# Patient Record
Sex: Male | Born: 1950 | Race: White | Hispanic: No | Marital: Married | Smoking: Former smoker
Health system: Southern US, Community
[De-identification: ages and names within clinical notes are randomized; demographics above are authoritative.]

## PROBLEM LIST (undated history)

## (undated) DIAGNOSIS — I10 Essential (primary) hypertension: Secondary | ICD-10-CM

## (undated) DIAGNOSIS — M109 Gout, unspecified: Secondary | ICD-10-CM

## (undated) DIAGNOSIS — N200 Calculus of kidney: Secondary | ICD-10-CM

## (undated) DIAGNOSIS — M1990 Unspecified osteoarthritis, unspecified site: Secondary | ICD-10-CM

## (undated) DIAGNOSIS — E785 Hyperlipidemia, unspecified: Secondary | ICD-10-CM

## (undated) DIAGNOSIS — E669 Obesity, unspecified: Secondary | ICD-10-CM

## (undated) DIAGNOSIS — K219 Gastro-esophageal reflux disease without esophagitis: Secondary | ICD-10-CM

## (undated) DIAGNOSIS — C801 Malignant (primary) neoplasm, unspecified: Secondary | ICD-10-CM

## (undated) DIAGNOSIS — H269 Unspecified cataract: Secondary | ICD-10-CM

## (undated) DIAGNOSIS — N529 Male erectile dysfunction, unspecified: Secondary | ICD-10-CM

## (undated) HISTORY — DX: Calculus of kidney: N20.0

## (undated) HISTORY — DX: Hyperlipidemia, unspecified: E78.5

## (undated) HISTORY — DX: Malignant (primary) neoplasm, unspecified: C80.1

## (undated) HISTORY — DX: Obesity, unspecified: E66.9

## (undated) HISTORY — DX: Gout, unspecified: M10.9

## (undated) HISTORY — DX: Essential (primary) hypertension: I10

## (undated) HISTORY — DX: Male erectile dysfunction, unspecified: N52.9

## (undated) HISTORY — DX: Unspecified cataract: H26.9

## (undated) HISTORY — PX: KNEE SURGERY: SHX244

## (undated) HISTORY — DX: Unspecified osteoarthritis, unspecified site: M19.90

---

## 2005-07-19 HISTORY — PX: TOTAL KNEE ARTHROPLASTY: SHX125

## 2007-07-20 HISTORY — PX: COLONOSCOPY: SHX174

## 2007-10-05 ENCOUNTER — Ambulatory Visit: Payer: Self-pay | Admitting: Internal Medicine

## 2007-10-05 DIAGNOSIS — E669 Obesity, unspecified: Secondary | ICD-10-CM | POA: Insufficient documentation

## 2007-10-05 DIAGNOSIS — N529 Male erectile dysfunction, unspecified: Secondary | ICD-10-CM | POA: Insufficient documentation

## 2007-10-05 DIAGNOSIS — M159 Polyosteoarthritis, unspecified: Secondary | ICD-10-CM | POA: Insufficient documentation

## 2007-10-05 DIAGNOSIS — I10 Essential (primary) hypertension: Secondary | ICD-10-CM | POA: Insufficient documentation

## 2007-10-05 DIAGNOSIS — E785 Hyperlipidemia, unspecified: Secondary | ICD-10-CM | POA: Insufficient documentation

## 2007-10-06 LAB — CONVERTED CEMR LAB
ALT: 22 units/L (ref 0–53)
AST: 27 units/L (ref 0–37)
Albumin: 3.9 g/dL (ref 3.5–5.2)
Alkaline Phosphatase: 85 units/L (ref 39–117)
Basophils Absolute: 0 10*3/uL (ref 0.0–0.1)
Basophils Relative: 0.3 % (ref 0.0–1.0)
CO2: 31 meq/L (ref 19–32)
Creatinine, Ser: 1 mg/dL (ref 0.4–1.5)
GFR calc Af Amer: 100 mL/min
GFR calc non Af Amer: 83 mL/min
Glucose, Bld: 84 mg/dL (ref 70–99)
HDL: 29.7 mg/dL — ABNORMAL LOW (ref >39.0)
Hemoglobin: 14.2 g/dL (ref 13.0–17.0)
LDL Cholesterol: 80 mg/dL (ref 0–99)
MCHC: 32.8 g/dL (ref 30.0–36.0)
Monocytes Relative: 9.2 % (ref 3.0–11.0)
Platelets: 235 10*3/uL (ref 150–400)
RBC: 5.03 M/uL (ref 4.22–5.81)
RDW: 13.1 % (ref 11.5–14.6)
Sodium: 141 meq/L (ref 135–145)
TSH: 0.93 microintl units/mL (ref 0.35–5.50)
Total Bilirubin: 1.1 mg/dL (ref 0.3–1.2)
Total CHOL/HDL Ratio: 4.3
Triglycerides: 99 mg/dL (ref 0–149)
VLDL: 20 mg/dL (ref 0–40)

## 2007-12-20 ENCOUNTER — Telehealth (INDEPENDENT_AMBULATORY_CARE_PROVIDER_SITE_OTHER): Payer: Self-pay | Admitting: *Deleted

## 2008-01-09 ENCOUNTER — Ambulatory Visit: Payer: Self-pay | Admitting: Internal Medicine

## 2008-02-20 ENCOUNTER — Ambulatory Visit: Payer: Self-pay | Admitting: Gastroenterology

## 2008-03-04 ENCOUNTER — Telehealth: Payer: Self-pay | Admitting: Gastroenterology

## 2008-03-05 ENCOUNTER — Encounter: Payer: Self-pay | Admitting: Gastroenterology

## 2008-03-05 ENCOUNTER — Ambulatory Visit: Payer: Self-pay | Admitting: Gastroenterology

## 2008-03-06 ENCOUNTER — Encounter: Payer: Self-pay | Admitting: Gastroenterology

## 2008-07-15 ENCOUNTER — Ambulatory Visit: Payer: Self-pay | Admitting: Internal Medicine

## 2008-07-16 LAB — CONVERTED CEMR LAB
Alkaline Phosphatase: 85 units/L (ref 39–117)
BUN: 10 mg/dL (ref 6–23)
Bilirubin, Direct: 0.1 mg/dL (ref 0.0–0.3)
Chloride: 109 meq/L (ref 96–112)
Cholesterol: 133 mg/dL (ref 0–200)
Creatinine, Ser: 1 mg/dL (ref 0.4–1.5)
GFR calc Af Amer: 100 mL/min
LDL Cholesterol: 72 mg/dL (ref 0–99)
Total Protein: 6.8 g/dL (ref 6.0–8.3)

## 2008-09-16 HISTORY — PX: PATELLAR TENDON REPAIR: SHX737

## 2008-12-30 ENCOUNTER — Telehealth: Payer: Self-pay | Admitting: Internal Medicine

## 2009-01-30 ENCOUNTER — Ambulatory Visit: Payer: Self-pay | Admitting: Internal Medicine

## 2009-02-03 LAB — CONVERTED CEMR LAB
AST: 31 units/L (ref 0–37)
Alkaline Phosphatase: 98 units/L (ref 39–117)
Basophils Relative: 0.4 % (ref 0.0–3.0)
CO2: 30 meq/L (ref 19–32)
Calcium: 10 mg/dL (ref 8.4–10.5)
Chloride: 106 meq/L (ref 96–112)
Creatinine, Ser: 1.2 mg/dL (ref 0.4–1.5)
Eosinophils Absolute: 0.4 10*3/uL (ref 0.0–0.7)
HCT: 38.8 % — ABNORMAL LOW (ref 39.0–52.0)
Hemoglobin: 12.9 g/dL — ABNORMAL LOW (ref 13.0–17.0)
MCHC: 33.2 g/dL (ref 30.0–36.0)
MCV: 85.9 fL (ref 78.0–100.0)
Monocytes Absolute: 0.8 10*3/uL (ref 0.1–1.0)
Neutro Abs: 6.1 10*3/uL (ref 1.4–7.7)
Potassium: 5.1 meq/L (ref 3.5–5.1)
RBC: 4.52 M/uL (ref 4.22–5.81)
Sodium: 142 meq/L (ref 135–145)
Total Bilirubin: 0.9 mg/dL (ref 0.3–1.2)
Total CHOL/HDL Ratio: 4

## 2009-02-17 ENCOUNTER — Ambulatory Visit: Payer: Self-pay | Admitting: Internal Medicine

## 2009-04-11 ENCOUNTER — Telehealth: Payer: Self-pay | Admitting: Internal Medicine

## 2009-05-01 ENCOUNTER — Telehealth: Payer: Self-pay | Admitting: Internal Medicine

## 2009-05-19 HISTORY — PX: TOTAL SHOULDER REPLACEMENT: SUR1217

## 2009-05-26 ENCOUNTER — Encounter: Payer: Self-pay | Admitting: Internal Medicine

## 2009-05-28 ENCOUNTER — Encounter: Payer: Self-pay | Admitting: Internal Medicine

## 2009-06-02 ENCOUNTER — Telehealth: Payer: Self-pay | Admitting: Internal Medicine

## 2009-06-16 ENCOUNTER — Telehealth: Payer: Self-pay | Admitting: Internal Medicine

## 2009-07-09 ENCOUNTER — Telehealth (INDEPENDENT_AMBULATORY_CARE_PROVIDER_SITE_OTHER): Payer: Self-pay | Admitting: *Deleted

## 2009-08-25 ENCOUNTER — Ambulatory Visit: Payer: Self-pay | Admitting: Internal Medicine

## 2009-09-04 ENCOUNTER — Ambulatory Visit: Payer: Self-pay | Admitting: Internal Medicine

## 2009-11-20 ENCOUNTER — Encounter: Payer: Self-pay | Admitting: Internal Medicine

## 2009-11-20 ENCOUNTER — Telehealth: Payer: Self-pay | Admitting: Internal Medicine

## 2009-11-21 ENCOUNTER — Telehealth: Payer: Self-pay | Admitting: Internal Medicine

## 2009-12-11 ENCOUNTER — Ambulatory Visit: Payer: Self-pay | Admitting: Internal Medicine

## 2009-12-11 DIAGNOSIS — M109 Gout, unspecified: Secondary | ICD-10-CM | POA: Insufficient documentation

## 2010-03-11 ENCOUNTER — Ambulatory Visit: Payer: Self-pay | Admitting: Internal Medicine

## 2010-03-12 LAB — CONVERTED CEMR LAB
AST: 37 units/L (ref 0–37)
Albumin: 3.9 g/dL (ref 3.5–5.2)
BUN: 14 mg/dL (ref 6–23)
Basophils Absolute: 0 10*3/uL (ref 0.0–0.1)
Basophils Relative: 0.4 % (ref 0.0–3.0)
CO2: 30 meq/L (ref 19–32)
Chloride: 105 meq/L (ref 96–112)
GFR calc non Af Amer: 77.34 mL/min (ref >60)
HCT: 42.9 % (ref 39.0–52.0)
HDL: 32.9 mg/dL — ABNORMAL LOW (ref >39.00)
Hemoglobin: 14.6 g/dL (ref 13.0–17.0)
LDL Cholesterol: 79 mg/dL (ref 0–99)
Lymphs Abs: 1.6 10*3/uL (ref 0.7–4.0)
Monocytes Relative: 8 % (ref 3.0–12.0)
Neutro Abs: 4.8 10*3/uL (ref 1.4–7.7)
PSA: 0.19 ng/mL (ref 0.10–4.00)
Potassium: 5.4 meq/L — ABNORMAL HIGH (ref 3.5–5.1)
RBC: 4.94 M/uL (ref 4.22–5.81)
RDW: 14.2 % (ref 11.5–14.6)
Sex Hormone Binding: 42 nmol/L (ref 13–71)
Testosterone Free: 79.6 pg/mL (ref 47.0–244.0)
Testosterone-% Free: 1.8 % (ref 1.6–2.9)
Testosterone: 449.01 ng/dL (ref 350–890)
Total Bilirubin: 1.3 mg/dL — ABNORMAL HIGH (ref 0.3–1.2)
Total CHOL/HDL Ratio: 4
Triglycerides: 64 mg/dL (ref 0.0–149.0)
VLDL: 12.8 mg/dL (ref 0.0–40.0)

## 2010-04-01 ENCOUNTER — Ambulatory Visit: Payer: Self-pay | Admitting: Internal Medicine

## 2010-04-01 LAB — CONVERTED CEMR LAB: Potassium: 4.8 meq/L (ref 3.5–5.1)

## 2010-08-05 ENCOUNTER — Ambulatory Visit
Admission: RE | Admit: 2010-08-05 | Discharge: 2010-08-05 | Payer: Self-pay | Source: Home / Self Care | Attending: Family Medicine | Admitting: Family Medicine

## 2010-08-05 DIAGNOSIS — J22 Unspecified acute lower respiratory infection: Secondary | ICD-10-CM | POA: Insufficient documentation

## 2010-08-05 LAB — CONVERTED CEMR LAB: Rapid Strep: NEGATIVE

## 2010-08-10 ENCOUNTER — Telehealth: Payer: Self-pay | Admitting: Family Medicine

## 2010-08-18 NOTE — Assessment & Plan Note (Signed)
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 * Codiene  Past History:  Past medical, surgical, family and social histories (including risk factors) reviewed for relevance to current acute and chronic problems.  Past Medical History: Reviewed history from 07/15/2008 and no changes required. Hyperlipidemia Hypertension Osteoarthritis Erectile dysfunction Obesity  Past Surgical History: Reviewed history from 08/25/2009 and no changes required. Age 23  Knee surgery for bowlegs 2007 Bilateral TKR 3/10  Left knee repair (detached patella)--- Dr Massie Maroon [facility]) 11/10 Left total shoulder replacement  Family History: Reviewed history from 10/05/2007 and no changes required. Dad died CHF @70    Had CAD Mom with high chol 1 brother--healthy HTN, DM in family No other CAD No prostate or colon cancer  Social History: Reviewed history from 10/05/2007 and no changes required. Occupation: Administrator, [employer] Married-2 daughters Former Smoker--quit in 20's Alcohol use-no  Review of Systems       no fever no ankle swelling  Physical Exam  General:  alert and normal appearance.   Msk:  left 2nd toe with sig hammer deformity moderate swelling with some puffiness Redness and some warmth slight redness past MTP and into foot   Impression & Recommendations:  Problem # 1:  CELLULITIS, FOOT (ICD-682.7) Assessment New unclear whether this is infection or  gout usually gets gout in 1st MTP and the degree of tenderness not necessarily indicative of the gout  P: will Rx with keflex    he will take the colchicine two times a day over the next few days as well  Problem # 2:  GOUT (ICD-274.9) Assessment: Comment Only will try the colchicine now  His updated medication list for this problem includes:    Colchicine 0.6 Mg Tabs (Colchicine) .Marland Kitchen... Take 1 tablet by mouth up to three times a day as needed acute gout if still available)  Problem # 3:  RASH AND OTHER NONSPECIFIC SKIN ERUPTION (ICD-782.1) Assessment: Comment Only mild rash intermittently always does well with topicort His updated medication list for this problem includes:    Desoximetasone 0.25 % Crea (Desoximetasone) .Marland Kitchen... Apply two times a day to rash as needed  Complete Medication List: 1)  Bisoprolol Fumarate 10 Mg Tabs (Bisoprolol fumarate) .... Take 1 tablet by mouth once a day 2)  Lisinopril 20 Mg Tabs (Lisinopril) .... Take 1 tablet by mouth once a day 3)  Lipitor 20 Mg Tabs (Atorvastatin calcium) .... Take 1 tablet by mouth once a day 4)  Adult Aspirin Low Strength 81 Mg Tbdp (Aspirin) .... Take 1 tablet by mouth once a day 5)  Levitra 20 Mg Tabs (Vardenafil hcl) .... Take 1 about 1/2 hour before sex 6)  Phentermine Hcl 37.5 Mg Tabs (Phentermine hcl) .Marland Kitchen.. 1 daily 7)  Omeprazole 20 Mg Cpdr (Omeprazole) .Marland Kitchen.. 1 tab daily for the next month. 8)  Colchicine 0.6 Mg  Tabs (Colchicine) .... Take 1 tablet by mouth up to three times a day as needed acute gout if still available) 9)  Cephalexin 500 Mg Caps (Cephalexin) .Marland Kitchen.. 1 tab by mouth three times a day for toe infection 10)  Desoximetasone 0.25 % Crea (Desoximetasone) .... Apply two times a day to rash as needed  Patient Instructions: 1)  Please schedule a follow-up appointment as needed .  Prescriptions: DESOXIMETASONE 0.25 % CREA (DESOXIMETASONE) apply two times a day to rash as needed  #45gm x 1   Entered and Authorized by:    Cindee Salt MD   Signed by:   Cindee Salt MD on 12/11/2009   Method used:   Electronically to        [facility] [address] [phone]* (retail)       [address] [place], [place]  [address]       Ph: [phone]       Fax: [phone]   RxID:   [id number] CEPHALEXIN 500 MG CAPS (CEPHALEXIN) 1 tab by mouth three times a day for toe infection  #30 x 0   Entered and Authorized by:   Cindee Salt MD   Signed by:   Cindee Salt MD on 12/11/2009   Method used:   Electronically to        [facility] [address] [phone]* (retail)       [address] [place], [place]  [address]       Ph: [phone]       Fax: [phone]   RxID:   [id number] LISINOPRIL 20 MG  TABS (LISINOPRIL) Take 1 tablet by mouth once a day  #90.0 Each x 3   Entered by:   Mervin Hack CMA (AAMA)   Authorized by:   Cindee Salt MD   Signed by:   Mervin Hack CMA (AAMA) on 12/11/2009   Method used:   Electronically to        [facility] [address] [phone]* (retail)       [address] [place]  [address]       Ph: [phone]       Fax: [phone]   RxID:   [id number]   Current Allergies (reviewed today): ! * CODIENE

## 2010-08-18 NOTE — Medication Information (Signed)
Summary: Prior Authorization Needed for Lipitor/[employer]  Prior Authorization Needed for Lipitor/[employer]   Imported By: Lanelle Bal 11/26/2009 10:52:42  _____________________________________________________________________  External Attachment:    Type:   Image     Comment:   External Document  Appended Document: Prior Authorization Needed for Lipitor/[employer] please check with the pharmacy to see if action is needed on this  Appended Document: Prior Authorization Needed for Lipitor/[employer] spoke with [facility] pharmacist and they states rx was approved and paid for by insurance 11/20/2009.

## 2010-08-18 NOTE — Assessment & Plan Note (Signed)
Summary: CPX/DLO   Vital Signs:  Patient profile:   60 year old male Weight:      250 pounds Temp:     98.9 degrees F oral Pulse rate:   76 / minute Pulse rhythm:   regular BP sitting:   148 / 100  (left arm) Cuff size:   large  Vitals Entered By: Mervin Hack CMA [facility]) (March 11, 2010 9:50 AM) CC: adult physical   History of Present Illness: Doing well in general Forget his wallet--got him anxious --this may have gotten his BP up doesn't independently check BP  Gout has been quiet  Still with ED not satisfied with levitra and viagra  Still convinced that the phentermine helps his appetite   Allergies: 1)  ! * Codiene  Past History:  Past medical, surgical, family and social histories (including risk factors) reviewed for relevance to current acute and chronic problems.  Past Medical History: Reviewed history from 07/15/2008 and no changes required. Hyperlipidemia Hypertension Osteoarthritis Erectile dysfunction Obesity  Past Surgical History: Reviewed history from 08/25/2009 and no changes required. Age 40  Knee surgery for bowlegs 2007 Bilateral TKR 3/10  Left knee repair (detached patella)--- Dr Massie Maroon [facility]) 11/10 Left total shoulder replacement  Family History: Reviewed history from 10/05/2007 and no changes required. Dad died CHF @70    Had CAD Mom with high chol 1 brother--healthy HTN, DM in family No other CAD No prostate or colon cancer  Social History: Reviewed history from 10/05/2007 and no changes required. 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 Eyes:  Denies double vision and vision loss-1 eye. ENT:  Denies decreased hearing and ringing in ears; teeth fine--regular with dentist. CV:  Denies chest pain or discomfort, difficulty breathing at night, difficulty breathing  while lying down, fainting, lightheadness, palpitations, and shortness of breath with exertion; no real exercise. Resp:  Denies cough and shortness of breath. GI:  Denies abdominal pain, bloody stools, change in bowel habits, dark tarry stools, indigestion, nausea, and vomiting. GU:  Complains of erectile dysfunction and nocturia; denies urinary frequency and urinary hesitancy; nocturia x 1. MS:  Complains of joint pain; denies joint swelling; gets soreness in right 3rd finger if overuses Right shoulder soreness at times. Derm:  Complains of lesion(s); denies rash; gets dry skin and occ bllisters. Neuro:  Denies headaches, numbness, tingling, and weakness. Psych:  Denies anxiety and depression. Endo:  Denies cold intolerance. Heme:  Denies abnormal bruising and enlarge lymph nodes. Allergy:  Denies seasonal allergies and sneezing.  Physical Exam  General:  alert and normal appearance.   Eyes:  pupils equal, pupils round, pupils reactive to light, and no optic disk abnormalities.   Ears:  R ear normal and L ear normal.   Mouth:  no erythema, no exudates, and no lesions.   Neck:  supple, no masses, no thyromegaly, no carotid bruits, and no cervical lymphadenopathy.   Lungs:  normal respiratory effort, no intercostal retractions, no accessory muscle use, and normal breath sounds.   Heart:  normal rate, regular rhythm, no murmur, and no gallop.   Abdomen:  soft, non-tender, and no masses.   Rectal:  no hemorrhoids and no masses.   Prostate:  no gland enlargement and no nodules.   Msk:  no joint tenderness and no joint swelling.   Pulses:  1+ in feet Extremities:  no edema  Neurologic:  alert & oriented X3, strength normal in all extremities, and gait normal.   Skin:  no rashes and no suspicious lesions.   Axillary Nodes:  No palpable lymphadenopathy Psych:  normally interactive, good eye contact, not anxious appearing, and not depressed appearing.     Impression &  Recommendations:  Problem # 1:  PREVENTIVE HEALTH CARE (ICD-V70.0) Assessment Comment Only discussed exercise due for PSA will hold phentermine for several weeks to see if helps ED---will continue if he requests  Problem # 2:  ERECTILE DYSFUNCTION, ORGANIC (ICD-607.84) Assessment: Deteriorated  will check free testosterone trial cialis urology if needs more  The following medications were removed from the medication list:    Levitra 20 Mg Tabs (Vardenafil hcl) .Marland Kitchen... Take 1 about 1/2 hour before sex His updated medication list for this problem includes:    Cialis 20 Mg Tabs (Tadalafil) .Marland Kitchen... 1 tab about 1 hour before sex  Orders: Specimen Handling (04540) T- * Misc. Laboratory test [id number])  Problem # 3:  HYPERTENSION (ICD-401.9) Assessment: Comment Only  recheck 146/98 has been well controlled no changes for now will try off the phentermine  His updated medication list for this problem includes:    Bisoprolol Fumarate 10 Mg Tabs (Bisoprolol fumarate) .Marland Kitchen... Take 1 tablet by mouth once a day    Lisinopril 20 Mg Tabs (Lisinopril) .Marland Kitchen... Take 1 tablet by mouth once a day  BP today: 148/100 Prior BP: 128/80 (12/11/2009)  Labs Reviewed: K+: 5.1 (01/30/2009) Creat: : 1.2 (01/30/2009)   Chol: 106 (01/30/2009)   HDL: 28.90 (01/30/2009)   LDL: 56 (01/30/2009)   TG: 106.0 (01/30/2009)  Orders: TLB-Renal Function Panel (80069-RENAL) TLB-CBC Platelet - w/Differential (85025-CBCD) TLB-TSH (Thyroid Stimulating Hormone) (84443-TSH) Venipuncture (14782)  Problem # 4:  HYPERLIPIDEMIA (ICD-272.4) Assessment: Unchanged  will recheck labs  His updated medication list for this problem includes:    Lipitor 20 Mg Tabs (Atorvastatin calcium) .Marland Kitchen... Take 1 tablet by mouth once a day  Labs Reviewed: SGOT: 31 (01/30/2009)   SGPT: 22 (01/30/2009)   HDL:28.90 (01/30/2009), 37.8 (07/15/2008)  LDL:56 (01/30/2009), 72 (07/15/2008)  Chol:106 (01/30/2009), 133 (07/15/2008)  Trig:106.0  (01/30/2009), 116 (07/15/2008)  Orders: TLB-Lipid Panel (80061-LIPID) TLB-Hepatic/Liver Function Pnl (80076-HEPATIC)  Complete Medication List: 1)  Bisoprolol Fumarate 10 Mg Tabs (Bisoprolol fumarate) .... Take 1 tablet by mouth once a day 2)  Lisinopril 20 Mg Tabs (Lisinopril) .... Take 1 tablet by mouth once a day 3)  Lipitor 20 Mg Tabs (Atorvastatin calcium) .... Take 1 tablet by mouth once a day 4)  Phentermine Hcl 37.5 Mg Tabs (Phentermine hcl) .Marland Kitchen.. 1 daily 5)  Colchicine 0.6 Mg Tabs (Colchicine) .... Take 1 tablet by mouth up to three times a day as needed acute gout if still available) 6)  Adult Aspirin Low Strength 81 Mg Tbdp (Aspirin) .... Take 1 tablet by mouth once a day 7)  Omeprazole 20 Mg Cpdr (Omeprazole) .Marland Kitchen.. 1 tab daily for the next month. 8)  Cialis 20 Mg Tabs (Tadalafil) .Marland Kitchen.. 1 tab about 1 hour before sex  Other Orders: TLB-PSA (Prostate Specific Antigen) (84153-PSA)  Patient Instructions: 1)  Please try off the phentermine for several weeks. Call if you want to restart 2)  Please schedule a follow-up appointment in 6 months .  Prescriptions: CIALIS 20 MG TABS (TADALAFIL) 1 tab about 1 hour before sex  #6 x 5   Entered and Authorized by:   Cindee Salt MD   Signed by:   Cindee Salt MD on  03/11/2010   Method used:   Electronically to        [facility]. [address] [phone]* (retail)       [address], [place]  [address]       Ph: [phone]       Fax: [phone]   RxID:   [id number]   Current Allergies (reviewed today): ! * CODIENE

## 2010-08-18 NOTE — Progress Notes (Signed)
Summary: Prior Authorization for Lipitor Adria Dill)  Phone Note From Pharmacy Call back at [phone]   Caller: [employer] Call For: Dr. Alphonsus Sias  Summary of Call: Received fax from [facility] stating that Lipitor 20mg  has be approved up to 60 days from 11/20/2009.  Adria Dill has entered an over-ride to allow claims for this medication to process under the patient's prescription benefit plan.  Form sent to be scanned into EMR.   Initial call taken by: Linde Gillis CMA [facility]),  Nov 20, 2009 2:54 PM

## 2010-08-18 NOTE — Progress Notes (Signed)
Summary: samples of lipitor given  Phone Note Call from Patient   Caller: Patient Summary of Call: Pt requests samples of lipitor while he waits for prior auth.  2 boxes of # 7 each given of lipitor 20 mg's.  Lot [id number], exp 11/12. Initial call taken by: Lowella Petties CMA,  Nov 21, 2009 12:23 PM

## 2010-08-18 NOTE — Assessment & Plan Note (Signed)
Summary: HEADACHE/COUGH/[place] PT/LETVAK/[place] LOU/CD   Vital Signs:  Patient profile:   60 year old male Height:      74 inches Weight:      251.50 pounds BMI:     32.41 O2 Sat:      97 % on Room air Temp:     98.7 degrees F oral Pulse rate:   66 / minute BP sitting:   152 / 96  (left arm) Cuff size:   large  Vitals Entered ByZella Ball Ewing (September 04, 2009 4:39 PM)  O2 Flow:  Room air CC: headache, cough, congestion/RE   CC:  headache, cough, and congestion/RE.  History of Present Illness: here with above;  has facial pain, pressure, fever and greenish d/c for 3 days; some mild ST, but Pt denies CP, sob, doe, wheezing, orthopnea, pnd, worsening LE edema, palps, dizziness or syncope   Pt denies new neuro symptoms such as headache, facial or extremity weakness   Overall good complaince with meds, tolerating well.    Problems Prior to Update: 1)  Sinusitis- Acute-nos  (ICD-461.9) 2)  Wart, Right Hand  (ICD-078.10) 3)  Preventive Health Care  (ICD-V70.0) 4)  Abdominal Pain, Epigastric  (ICD-789.06) 5)  Special Screening Malignant Neoplasm of Prostate  (ICD-V76.44) 6)  Obesity  (ICD-278.00) 7)  Erectile Dysfunction, Organic  (ICD-607.84) 8)  Osteoarthritis  (ICD-715.90) 9)  Hypertension  (ICD-401.9) 10)  Hyperlipidemia  (ICD-272.4)  Medications Prior to Update: 1)  Bisoprolol Fumarate 10 Mg  Tabs (Bisoprolol Fumarate) .... Take 1 Tablet By Mouth Once A Day 2)  Lisinopril 20 Mg  Tabs (Lisinopril) .... Take 1 Tablet By Mouth Once A Day 3)  Lipitor 20 Mg  Tabs (Atorvastatin Calcium) .... Take 1 Tablet By Mouth Once A Day 4)  Adult Aspirin Low Strength 81 Mg  Tbdp (Aspirin) .... Take 1 Tablet By Mouth Once A Day 5)  Levitra 20 Mg  Tabs (Vardenafil Hcl) .... Take 1 About 1/2 Hour Before Sex 6)  Phentermine Hcl 37.5 Mg  Tabs (Phentermine Hcl) .Marland Kitchen.. 1 Daily 7)  Omeprazole 20 Mg Cpdr (Omeprazole) .Marland Kitchen.. 1 Tab Daily For The Next Month. 8)  Oxycodone Hcl 5 Mg Tabs (Oxycodone Hcl)  .Marland Kitchen.. 1-2 Tabs Every 4 Hours As Needed For Severe Pain 9)  Colchicine 0.6 Mg Tabs (Colchicine) .... Take 1 Tablet By Mouth Up To Three Times A Day As Needed Acute Gout If Still Available)  Current Medications (verified): 1)  Bisoprolol Fumarate 10 Mg  Tabs (Bisoprolol Fumarate) .... Take 1 Tablet By Mouth Once A Day 2)  Lisinopril 20 Mg  Tabs (Lisinopril) .... Take 1 Tablet By Mouth Once A Day 3)  Lipitor 20 Mg  Tabs (Atorvastatin Calcium) .... Take 1 Tablet By Mouth Once A Day 4)  Adult Aspirin Low Strength 81 Mg  Tbdp (Aspirin) .... Take 1 Tablet By Mouth Once A Day 5)  Levitra 20 Mg  Tabs (Vardenafil Hcl) .... Take 1 About 1/2 Hour Before Sex 6)  Phentermine Hcl 37.5 Mg  Tabs (Phentermine Hcl) .Marland Kitchen.. 1 Daily 7)  Omeprazole 20 Mg Cpdr (Omeprazole) .Marland Kitchen.. 1 Tab Daily For The Next Month. 8)  Oxycodone Hcl 5 Mg Tabs (Oxycodone Hcl) .Marland Kitchen.. 1-2 Tabs Every 4 Hours As Needed For Severe Pain 9)  Colchicine 0.6 Mg Tabs (Colchicine) .... Take 1 Tablet By Mouth Up To Three Times A Day As Needed Acute Gout If Still Available) 10)  Levaquin 500 Mg Tabs (Levofloxacin) .Marland Kitchen.. 1po Once Daily  Allergies (verified): 1)  ! 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  , mild ill  Head:  normocephalic and atraumatic.   Eyes:  vision grossly intact, pupils equal, and pupils  round.   Ears:  bilat tm's red, sinus tender bilat Nose:  nasal dischargemucosal pallor and mucosal erythema.   Mouth:  pharyngeal erythema and fair dentition.   Neck:  supple and cervical lymphadenopathy.   Lungs:  normal respiratory effort and normal breath sounds.   Heart:  normal rate and regular rhythm.   Extremities:  no edema, no erythema    Impression & Recommendations:  Problem # 1:  SINUSITIS- ACUTE-NOS (ICD-461.9)  His updated medication list for this problem includes:    Levaquin 500 Mg Tabs (Levofloxacin) .Marland Kitchen... 1po once daily treat as above, f/u any worsening signs or symptoms , also for mucinex otc as needed   Problem # 2:  HYPERTENSION (ICD-401.9)  His updated medication list for this problem includes:    Bisoprolol Fumarate 10 Mg Tabs (Bisoprolol fumarate) .Marland Kitchen... Take 1 tablet by mouth once a day    Lisinopril 20 Mg Tabs (Lisinopril) .Marland Kitchen... Take 1 tablet by mouth once a day  BP today: 152/96 Prior BP: 142/90 (08/25/2009)  Labs Reviewed: K+: 5.1 (01/30/2009) Creat: : 1.2 (01/30/2009)   Chol: 106 (01/30/2009)   HDL: 28.90 (01/30/2009)   LDL: 56 (01/30/2009)   TG: 106.0 (01/30/2009) mild elev today, likely situational, ok to follow, continue same treatment   Complete Medication List: 1)  Bisoprolol Fumarate 10 Mg Tabs (Bisoprolol fumarate) .... Take 1 tablet by mouth once a day 2)  Lisinopril 20 Mg Tabs (Lisinopril) .... Take 1 tablet by mouth once a day 3)  Lipitor 20 Mg Tabs (Atorvastatin calcium) .... Take 1 tablet by mouth once a day 4)  Adult Aspirin Low Strength 81 Mg Tbdp (Aspirin) .... Take 1 tablet by mouth once a day 5)  Levitra 20 Mg Tabs (Vardenafil hcl) .... Take 1 about 1/2 hour before sex 6)  Phentermine Hcl 37.5 Mg Tabs (Phentermine hcl) .Marland Kitchen.. 1 daily 7)  Omeprazole 20 Mg Cpdr (Omeprazole) .Marland Kitchen.. 1 tab daily for the next month. 8)  Oxycodone Hcl 5 Mg Tabs (Oxycodone hcl) .Marland Kitchen.. 1-2 tabs every 4 hours as needed for severe pain 9)  Colchicine 0.6 Mg Tabs  (Colchicine) .... Take 1 tablet by mouth up to three times a day as needed acute gout if still available) 10)  Levaquin 500 Mg Tabs (Levofloxacin) .Marland Kitchen.. 1po once daily  Patient Instructions: 1)  Please take all new medications as prescribed  - the antibtiotic was sent to the pharmacy on the computer 2)  You can also use Mucinex OTC or it's generic or Coricidin for congestion , and tylenol for pain 3)  Continue all previous medications as before this visit  4)  Please schedule an appointment with your primary doctor as needed 5)  Your blood pressure was mild elevated today, possibly from the infection - :  6)  Check your Blood Pressure regularly. If it is above 140/90: you should make an appointment. Prescriptions: LEVAQUIN 500 MG TABS (LEVOFLOXACIN) 1po once daily  #10 x 0   Entered and Authorized by:   Corwin Levins MD   Signed by:   Corwin Levins MD on 09/04/2009   Method used:   Electronically to        [facility] [address] [phone]* (retail)       [address], [place]  [address]       Ph: [phone]       Fax: [phone]   RxID:   [id number]

## 2010-08-18 NOTE — Assessment & Plan Note (Signed)
Summary: 6 M F/U DLO   Vital Signs:  Patient profile:   60 year old male Weight:      251 pounds Temp:     99.0 degrees F oral Pulse rate:   72 / minute Pulse rhythm:   regular BP sitting:   142 / 90  (left arm) Cuff size:   large  Vitals Entered By: Mervin Hack CMA Duncan Dull) (August 25, 2009 2:39 PM) CC: 6 month follow-up   History of Present Illness: Had left shoulder replacement in November still stiff and sore at times still working on rehab  had gout flare after surgery has calmed down again Now only uses colchicine as needed   Plans to restart vitamin and fish oil Wants to continue the phentermine--it really controls his appetite  No chest pain  No SOB Hasn't really gotten back to exercising since the shoulder surgery  HAs wart on right hand would like it treated  Allergies: 1)  ! * Codiene  Past History:  Past medical, surgical, family and social histories (including risk factors) reviewed for relevance to current acute and chronic problems.  Past Medical History: Reviewed history from 07/15/2008 and no changes required. Hyperlipidemia Hypertension Osteoarthritis Erectile dysfunction Obesity  Past Surgical History: Age 33  Knee surgery for bowlegs 2007 Bilateral TKR 3/10  Left knee repair (detached patella)--- Dr Massie Maroon [facility]) 11/10 Left total shoulder replacement  Family History: Reviewed history from 10/05/2007 and no changes required. Dad died CHF @70    Had CAD Mom with high chol 1 brother--healthy HTN, DM in family No other CAD No prostate or colon cancer  Social History: Reviewed history from 10/05/2007 and no changes required. Occupation: Administrator, [employer] Married-2 daughters Former Smoker--quit in 20's Alcohol use-no  Review of Systems       stools have been normal sleeps fairly well--does get up at least once to void weight is stable despite post op sedentary times  Physical Exam  General:   alert and normal appearance.   Neck:  supple, no masses, no thyromegaly, no carotid bruits, and no cervical lymphadenopathy.   Lungs:  normal respiratory effort and normal breath sounds.   Heart:  normal rate, regular rhythm, no murmur, and no gallop.   Abdomen:  soft and non-tender.   Extremities:  no edema Skin:  wart between right 1st and 2nd fingers in web space no suspicious lesions.   Psych:  normally interactive, good eye contact, not anxious appearing, and not depressed appearing.     Impression & Recommendations:  Problem # 1:  HYPERTENSION (ICD-401.9) Assessment Unchanged good control no changes needed  His updated medication list for this problem includes:    Bisoprolol Fumarate 10 Mg Tabs (Bisoprolol fumarate) .Marland Kitchen... Take 1 tablet by mouth once a day    Lisinopril 20 Mg Tabs (Lisinopril) .Marland Kitchen... Take 1 tablet by mouth once a day  BP today: 142/90 Prior BP: 158/98 (02/17/2009)  Labs Reviewed: K+: 5.1 (01/30/2009) Creat: : 1.2 (01/30/2009)   Chol: 106 (01/30/2009)   HDL: 28.90 (01/30/2009)   LDL: 56 (01/30/2009)   TG: 106.0 (01/30/2009)  Problem # 2:  HYPERLIPIDEMIA (ICD-272.4) Assessment: Unchanged good control labs with PE next time  His updated medication list for this problem includes:    Lipitor 20 Mg Tabs (Atorvastatin calcium) .Marland Kitchen... Take 1 tablet by mouth once a day  Labs Reviewed: SGOT: 31 (01/30/2009)   SGPT: 22 (01/30/2009)   HDL:28.90 (01/30/2009), 37.8 (07/15/2008)  LDL:56 (01/30/2009), 72 (07/15/2008)  Chol:106 (01/30/2009), 133 (07/15/2008)  Trig:106.0 (01/30/2009), 116 (07/15/2008)  Problem # 3:  OSTEOARTHRITIS (ICD-715.90) Assessment: Improved better since shoulder replacement  The following medications were removed from the medication list:    Acetaminophen 650 Mg Cr-tabs (Acetaminophen) .Marland Kitchen... 2 tabs by mouth two times a day for arthritis    Hydrocodone-acetaminophen 5-325 Mg Tabs (Hydrocodone-acetaminophen) .Marland Kitchen... 1 tab every 4 hours as needed  for severe arthritic pain His updated medication list for this problem includes:    Adult Aspirin Low Strength 81 Mg Tbdp (Aspirin) .Marland Kitchen... Take 1 tablet by mouth once a day    Oxycodone Hcl 5 Mg Tabs (Oxycodone hcl) .Marland Kitchen... 1-2 tabs every 4 hours as needed for severe pain  Problem # 4:  OBESITY (ICD-278.00) Assessment: Unchanged weight stable despite winter and surgery will refill phentermine but discussed that this would not be long term  Problem # 5:  WART, RIGHT HAND (ICD-078.10) Assessment: New  pared with #15 blade liquid nitrogen 1 minute x 2 discussed home care  Orders: Wart Destruct <14 (17110)  Complete Medication List: 1)  Bisoprolol Fumarate 10 Mg Tabs (Bisoprolol fumarate) .... Take 1 tablet by mouth once a day 2)  Lisinopril 20 Mg Tabs (Lisinopril) .... Take 1 tablet by mouth once a day 3)  Lipitor 20 Mg Tabs (Atorvastatin calcium) .... Take 1 tablet by mouth once a day 4)  Adult Aspirin Low Strength 81 Mg Tbdp (Aspirin) .... Take 1 tablet by mouth once a day 5)  Levitra 20 Mg Tabs (Vardenafil hcl) .... Take 1 about 1/2 hour before sex 6)  Phentermine Hcl 37.5 Mg Tabs (Phentermine hcl) .Marland Kitchen.. 1 daily 7)  Omeprazole 20 Mg Cpdr (Omeprazole) .Marland Kitchen.. 1 tab daily for the next month. 8)  Oxycodone Hcl 5 Mg Tabs (Oxycodone hcl) .Marland Kitchen.. 1-2 tabs every 4 hours as needed for severe pain 9)  Colchicine 0.6 Mg Tabs (Colchicine) .... Take 1 tablet by mouth up to three times a day as needed acute gout if still available)  Patient Instructions: 1)  Please schedule a follow-up appointment in 6 months for physical Prescriptions: PHENTERMINE HCL 37.5 MG  TABS (PHENTERMINE HCL) 1 daily  #30 x 5   Entered and Authorized by:   Cindee Salt MD   Signed by:   Cindee Salt MD on 08/25/2009   Method used:   Print then Give to Patient   RxID:   [id number] COLCHICINE 0.6 MG TABS (COLCHICINE) take 1 tablet by mouth up to three times a day as needed acute gout If still available)  #60 x  0   Entered and Authorized by:   Cindee Salt MD   Signed by:   Cindee Salt MD on 08/25/2009   Method used:   Electronically to        [facility] [address] [phone]* (retail)       [address], [place]  [address]       Ph: [phone]       Fax: [phone]   RxID:   [id number]   Current Allergies (reviewed today): ! * CODIENE

## 2010-08-20 NOTE — Assessment & Plan Note (Signed)
Summary: COUGH,CONGESTION,EAR,ST/CLE   Vital Signs:  Patient profile:   60 year old male Weight:      263.12 pounds BMI:     33.90 O2 Sat:      97 % on Room air Temp:     100.0 degrees F oral Pulse rate:   64 / minute Pulse rhythm:   regular BP sitting:   150 / 90  (left arm) Cuff size:   large  Vitals Entered By: Sydell Axon LPN (August 05, 2010 2:40 PM)  O2 Flow:  Room air CC: Bad cold, cough, congestion, ear pain at times and sore throat   History of Present Illness: Started on Friday afternoon with ST.  Pain with swallowing.  Pressure on the "top of the head." Rhinorrhea.  Cough with some sputum production.  +fever prev.  This AM with felt light headed after blowing nose hard but this was isolated and self resolved.  "I feel lousy."  No vomiting.   No smoking.  Some aches per patient.  Sick contacts- yes.  Never had a flu shot.  Some maxillary pain bilaterally this AM, less now.    Allergies: 1)  ! * Codiene  Review of Systems       See HPI.  Otherwise negative.    Physical Exam  General:  GEN: nad, alert and oriented HEENT: mucous membranes moist, TM w/o erythema but SOM noted, nasal epithelium injected, OP with cobblestoning NECK: supple w/o LA, upper airway noise noted CV: rrr. PULM: ctab, no inc wob, no decrease in bs EXT: no edema    Impression & Recommendations:  Problem # 1:  URI (ICD-465.9) max and frontal sinuses not tender to palpation and he is nontoxic with RST neg and clear lungs.  LIkely a benign viral process.  Supporitve tx and call back.  I would consider empiric antibiotics if symptoms continue >1 week.  I would expect him to improve gradually in the interval.  d/w patient and he understood.  His updated medication list for this problem includes:    Adult Aspirin Low Strength 81 Mg Tbdp (Aspirin) .Marland Kitchen... Take 1 tablet by mouth once a day    Tessalon 200 Mg Caps (Benzonatate) .Marland Kitchen... 1 by mouth three times a day for cough  Orders: Prescription  Created Electronically [phone])  Complete Medication List: 1)  Bisoprolol Fumarate 10 Mg Tabs (Bisoprolol fumarate) .... Take 1 tablet by mouth once a day 2)  Lisinopril 20 Mg Tabs (Lisinopril) .... Take 1 tablet by mouth once a day 3)  Lipitor 20 Mg Tabs (Atorvastatin calcium) .... Take 1 tablet by mouth once a day 4)  Phentermine Hcl 37.5 Mg Tabs (Phentermine hcl) .Marland Kitchen.. 1 daily 5)  Colchicine 0.6 Mg Tabs (Colchicine) .... Take 1 tablet by mouth up to three times a day as needed acute gout if still available) 6)  Adult Aspirin Low Strength 81 Mg Tbdp (Aspirin) .... Take 1 tablet by mouth once a day 7)  Omeprazole 20 Mg Cpdr (Omeprazole) .Marland Kitchen.. 1 tab daily for the next month. 8)  Cialis 20 Mg Tabs (Tadalafil) .Marland Kitchen.. 1 tab about 1 hour before sex 9)  Tessalon 200 Mg Caps (Benzonatate) .Marland Kitchen.. 1 by mouth three times a day for cough  Other Orders: Rapid Strep (11914)  Patient Instructions: 1)  Get plenty of rest, drink lots of clear liquids, and use Tylenolfor fever and comfort. Call me if you aren't improving by early next week.  I would gargle with warm salt water and use the  tessalon/delsym for cough.  This should gradually improve.   Prescriptions: TESSALON 200 MG CAPS (BENZONATATE) 1 by mouth three times a day for cough  #30 x 1   Entered and Authorized by:   Crawford Givens MD   Signed by:   Crawford Givens MD on 08/05/2010   Method used:   Faxed to ...       [facility] (retail)       [address]       [place], [place]    [place]       Ph: [phone]       Fax: [phone]   RxID:   [id number]    Orders Added: 1)  Rapid Strep [32951] 2)  Prescription Created Electronically [G8553] 3)  Est. Patient Level III [88416]    Current Allergies (reviewed today): ! * CODIENE     Laboratory Results  Date/Time Received: August 05, 2010 3:09 PM   Other Tests  Rapid Strep: negative

## 2010-08-20 NOTE — Progress Notes (Signed)
Summary: not any better  Phone Note Call from Patient Call back at Work Phone [phone]   Caller: Patient Summary of Call: Pt was seen last week for sinus sxs.  He says he has gotten worse- has a lot of sinus congestion, bloody nasal drainage, his ear is starting to hurt.  He is asking for an antibiotic to be sent to [facility] [place].  He said he really appreciates the way his calls are always handled in a  timely manner. Initial call taken by: Lowella Petties CMA, AAMA,  August 10, 2010 12:09 PM  Follow-up for Phone Call        please call in amoxil 875mg  by mouth two times a day x10d.  #20, no rf.  follow up if not improved.  thanks.  Follow-up by: Crawford Givens MD,  August 10, 2010 2:09 PM  Additional Follow-up for Phone Call Additional follow up Details #1::        Patient Advised. Medication phoned to pharmacy.  Additional Follow-up by: Delilah Shan CMA (AAMA),  August 10, 2010 2:55 PM    New/Updated Medications: AMOXICILLIN 875 MG TABS (AMOXICILLIN) Take one tablet by mouth two times a day x 10 days. Prescriptions: AMOXICILLIN 875 MG TABS (AMOXICILLIN) Take one tablet by mouth two times a day x 10 days.  #20 x 0   Entered by:   Delilah Shan CMA (AAMA)   Authorized by:   Crawford Givens MD   Signed by:   Delilah Shan CMA (AAMA) on 08/10/2010   Method used:   Electronically to        [facility] [address] (retail)       [address]       [place], [place]  [address]       Ph: [phone]       Fax: [phone]   RxID:   [id number]

## 2010-09-15 ENCOUNTER — Encounter: Payer: Self-pay | Admitting: Internal Medicine

## 2010-09-15 ENCOUNTER — Ambulatory Visit (INDEPENDENT_AMBULATORY_CARE_PROVIDER_SITE_OTHER): Payer: PRIVATE HEALTH INSURANCE | Admitting: Internal Medicine

## 2010-09-15 DIAGNOSIS — E785 Hyperlipidemia, unspecified: Secondary | ICD-10-CM

## 2010-09-15 DIAGNOSIS — E669 Obesity, unspecified: Secondary | ICD-10-CM

## 2010-09-15 DIAGNOSIS — M109 Gout, unspecified: Secondary | ICD-10-CM

## 2010-09-15 DIAGNOSIS — I10 Essential (primary) hypertension: Secondary | ICD-10-CM

## 2010-09-24 NOTE — Assessment & Plan Note (Signed)
Summary: 6 MONTH FOLLOW UP/[facility]   Vital Signs:  Patient profile:   60 year old male Weight:      273 pounds Temp:     98.6 degrees F oral Pulse rate:   66 / minute Pulse rhythm:   regular BP sitting:   150 / 103  (left arm) Cuff size:   large  Vitals Entered By: Mervin Hack CMA Duncan Dull) (September 15, 2010 9:18 AM)  Serial Vital Signs/Assessments:  Time      Position  BP       Pulse  Resp  Temp     By           R Arm     160/104                        Cindee Salt MD  CC: follow-up   History of Present Illness: Is finally over the sinus infection antibiotic worked  No other concerns not happy about putting on weight  Doesn't check BP at home No headaches No vision problems---recent eye exam  No chest pain No SOB No edema  No gout problems of note  Allergies: 1)  ! * Codiene  Past History:  Past medical, surgical, family and social histories (including risk factors) reviewed for relevance to current acute and chronic problems.  Past Medical History: Reviewed history from 07/15/2008 and no changes required. Hyperlipidemia Hypertension Osteoarthritis Erectile dysfunction Obesity  Past Surgical History: Reviewed history from 08/25/2009 and no changes required. Age 37  Knee surgery for bowlegs 2007 Bilateral TKR 3/10  Left knee repair (detached patella)--- Dr Massie Maroon [facility]) 11/10 Left total shoulder replacement  Family History: Reviewed history from 10/05/2007 and no changes required. Dad died CHF @70    Had CAD Mom with high chol 1 brother--healthy HTN, DM in family No other CAD No prostate or colon cancer  Social History: Reviewed history from 10/05/2007 and no changes required. 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   Neck:  supple, no masses, no thyromegaly, no carotid bruits, and no cervical lymphadenopathy.   Lungs:  normal respiratory effort, no intercostal retractions, no accessory muscle use, and normal breath sounds.   Heart:  normal rate, regular rhythm, no murmur, and no gallop.   Extremities:  no edema Psych:  normally interactive, good eye contact, not anxious appearing, and not depressed appearing.     Impression & Recommendations:  Problem # 1:  HYPERTENSION (ICD-401.9) Assessment Deteriorated he doesn't want more meds but hasn't done necessary lifestyle measures will give phentermine he will work on weight, etc  His updated medication list for this problem includes:    Bisoprolol Fumarate 10 Mg Tabs (Bisoprolol fumarate) .Marland Kitchen... Take 1 tablet by mouth once a day    Lisinopril 20 Mg Tabs (Lisinopril) .Marland Kitchen... Take 1 tablet by mouth once a day  BP today: 150/103 Prior BP: 150/90 (08/05/2010)  Labs Reviewed: K+: 4.8 (04/01/2010) Creat: : 1.1 (03/11/2010)   Chol: 125 (03/11/2010)   HDL: 32.90 (03/11/2010)   LDL: 79 (03/11/2010)   TG: 64.0 (03/11/2010)  Problem # 2:  HYPERLIPIDEMIA (ICD-272.4) Assessment: Unchanged at goal on the med  His updated medication list for this problem includes:    Lipitor  20 Mg Tabs (Atorvastatin calcium) .Marland Kitchen... Take 1 tablet by mouth once a day  Labs Reviewed: SGOT: 37 (03/11/2010)   SGPT: 23 (03/11/2010)   HDL:32.90 (03/11/2010), 28.90 (01/30/2009)  LDL:79 (03/11/2010), 56 (01/30/2009)  Chol:125 (03/11/2010), 106 (01/30/2009)  Trig:64.0 (03/11/2010), 106.0 (01/30/2009)  Problem # 3:  GOUT (ICD-274.9) Assessment: Improved has been quiet hasn't needed the meds of late  His updated medication list for this problem includes:    Colchicine 0.6 Mg Tabs (Colchicine) .Marland Kitchen... Take 1 tablet by mouth up to three times a day as needed acute gout if still available)  Problem # 4:  OBESITY (ICD-278.00) Assessment:  Deteriorated will try phentermine again briefly and see if he can use that to springboard his behavioral program  Complete Medication List: 1)  Bisoprolol Fumarate 10 Mg Tabs (Bisoprolol fumarate) .... Take 1 tablet by mouth once a day 2)  Lisinopril 20 Mg Tabs (Lisinopril) .... Take 1 tablet by mouth once a day 3)  Lipitor 20 Mg Tabs (Atorvastatin calcium) .... Take 1 tablet by mouth once a day 4)  Colchicine 0.6 Mg Tabs (Colchicine) .... Take 1 tablet by mouth up to three times a day as needed acute gout if still available) 5)  Cialis 20 Mg Tabs (Tadalafil) .Marland Kitchen.. 1 tab about 1 hour before sex 6)  Adult Aspirin Low Strength 81 Mg Tbdp (Aspirin) .... Take 1 tablet by mouth once a day 7)  Omeprazole 20 Mg Cpdr (Omeprazole) .Marland Kitchen.. 1 tab daily for the next month. 8)  Phentermine Hcl 37.5 Mg Caps (Phentermine hcl) .Marland Kitchen.. 1 tab daily to decrease appetite  Patient Instructions: 1)  Please schedule a follow-up appointment in 3 months .  Prescriptions: PHENTERMINE HCL 37.5 MG CAPS (PHENTERMINE HCL) 1 tab daily to decrease appetite  #30 x 2   Entered and Authorized by:   Cindee Salt MD   Signed by:   Cindee Salt MD on 09/15/2010   Method used:   Print then Give to Patient   RxID:   [id number]    Orders Added: 1)  Est. Patient Level IV [16010]    Current Allergies (reviewed today): ! * CODIENE  Prevention & Chronic Care Immunizations   Influenza vaccine: Not documented    Tetanus booster: 01/30/2009: Tdap    Pneumococcal vaccine: Not documented  Colorectal Screening   Hemoccult: Not documented    Colonoscopy: Location:  [facility].    (03/05/2008)   Colonoscopy due: 02/2018  Other Screening   PSA: 0.19  (03/11/2010)   Smoking status: quit  (10/05/2007)  Lipids   Total Cholesterol: 125  (03/11/2010)   LDL: 79  (03/11/2010)   LDL Direct: Not documented   HDL: 32.90  (03/11/2010)   Triglycerides: 64.0  (03/11/2010)    SGOT (AST): 37   (03/11/2010)   SGPT (ALT): 23  (03/11/2010)   Alkaline phosphatase: 88  (03/11/2010)   Total bilirubin: 1.3  (03/11/2010)  Hypertension   Last Blood Pressure: 150 / 103  (09/15/2010)   Serum creatinine: 1.1  (03/11/2010)   Serum potassium 4.8  (04/01/2010)  Self-Management Support :    Hypertension self-management support: Not documented    Lipid self-management support: Not documented

## 2010-12-02 ENCOUNTER — Ambulatory Visit: Payer: PRIVATE HEALTH INSURANCE | Admitting: Internal Medicine

## 2010-12-12 ENCOUNTER — Other Ambulatory Visit: Payer: Self-pay | Admitting: Internal Medicine

## 2010-12-12 NOTE — Telephone Encounter (Signed)
Is it okay to refill this ?  

## 2010-12-15 ENCOUNTER — Other Ambulatory Visit: Payer: Self-pay | Admitting: *Deleted

## 2010-12-15 MED ORDER — PHENTERMINE HCL 37.5 MG PO CAPS: 37.5000 mg | ORAL_CAPSULE | ORAL | Status: DC

## 2010-12-15 NOTE — Telephone Encounter (Signed)
Left message on machine that rx is ready for pick-up, and it will be at our front desk.  

## 2010-12-15 NOTE — Telephone Encounter (Signed)
Okay to refill #30 x 1 Let me know if written necessary

## 2010-12-15 NOTE — Telephone Encounter (Signed)
Pt has appt on 6/27 and he is asking if this can be refilled until then.

## 2010-12-18 ENCOUNTER — Other Ambulatory Visit: Payer: Self-pay | Admitting: Internal Medicine

## 2011-01-09 ENCOUNTER — Other Ambulatory Visit: Payer: Self-pay | Admitting: Internal Medicine

## 2011-01-15 ENCOUNTER — Ambulatory Visit: Payer: PRIVATE HEALTH INSURANCE | Admitting: Internal Medicine

## 2011-02-22 ENCOUNTER — Encounter: Payer: Self-pay | Admitting: Internal Medicine

## 2011-02-23 ENCOUNTER — Ambulatory Visit (INDEPENDENT_AMBULATORY_CARE_PROVIDER_SITE_OTHER): Payer: PRIVATE HEALTH INSURANCE | Admitting: Internal Medicine

## 2011-02-23 ENCOUNTER — Encounter: Payer: Self-pay | Admitting: Internal Medicine

## 2011-02-23 DIAGNOSIS — I10 Essential (primary) hypertension: Secondary | ICD-10-CM

## 2011-02-23 DIAGNOSIS — N529 Male erectile dysfunction, unspecified: Secondary | ICD-10-CM

## 2011-02-23 DIAGNOSIS — E669 Obesity, unspecified: Secondary | ICD-10-CM

## 2011-02-23 DIAGNOSIS — E785 Hyperlipidemia, unspecified: Secondary | ICD-10-CM

## 2011-02-23 LAB — CBC WITH DIFFERENTIAL/PLATELET
Basophils Absolute: 0 10*3/uL (ref 0.0–0.1)
Eosinophils Absolute: 0.2 10*3/uL (ref 0.0–0.7)
HCT: 44.3 % (ref 39.0–52.0)
Lymphs Abs: 2.5 10*3/uL (ref 0.7–4.0)
MCHC: 33.2 g/dL (ref 30.0–36.0)
MCV: 87.3 fl (ref 78.0–100.0)
Monocytes Absolute: 0.8 10*3/uL (ref 0.1–1.0)
Neutrophils Relative %: 59.1 % (ref 43.0–77.0)
Platelets: 212 10*3/uL (ref 150.0–400.0)
RDW: 13.9 % (ref 11.5–14.6)

## 2011-02-23 LAB — BASIC METABOLIC PANEL
BUN: 17 mg/dL (ref 6–23)
Chloride: 104 mEq/L (ref 96–112)
Creatinine, Ser: 1.3 mg/dL (ref 0.4–1.5)
Glucose, Bld: 91 mg/dL (ref 70–99)
Potassium: 5.3 mEq/L — ABNORMAL HIGH (ref 3.5–5.1)

## 2011-02-23 LAB — HEPATIC FUNCTION PANEL
AST: 30 U/L (ref 0–37)
Albumin: 4 g/dL (ref 3.5–5.2)

## 2011-02-23 LAB — LIPID PANEL
Cholesterol: 129 mg/dL (ref 0–200)
HDL: 34.9 mg/dL — ABNORMAL LOW (ref >39.00)
LDL Cholesterol: 79 mg/dL (ref 0–99)
Triglycerides: 78 mg/dL (ref 0.0–149.0)

## 2011-02-23 LAB — TSH: TSH: 1.16 u[IU]/mL (ref 0.35–5.50)

## 2011-02-23 MED ORDER — VARDENAFIL HCL 20 MG PO TABS: 20.0000 mg | ORAL_TABLET | Freq: Every day | ORAL | Status: DC | PRN

## 2011-02-23 MED ORDER — PHENTERMINE HCL 37.5 MG PO TABS: 37.5000 mg | ORAL_TABLET | Freq: Every day | ORAL | Status: DC

## 2011-02-23 NOTE — Assessment & Plan Note (Signed)
BP Readings from Last 3 Encounters:  02/23/11 140/90  09/15/10 150/103  08/05/10 150/90   Better No changes in meds Due for labs

## 2011-02-23 NOTE — Assessment & Plan Note (Signed)
Prefers the levitra Will change

## 2011-02-23 NOTE — Assessment & Plan Note (Signed)
Improved on phentermine Will continue till next appt and hopefully stop then

## 2011-02-23 NOTE — Assessment & Plan Note (Signed)
No problems with meds Due for labs 

## 2011-02-23 NOTE — Progress Notes (Signed)
Subjective:    Patient ID: Sean Garcia, male    DOB: 1950-08-16, 60 y.o.   MRN: [id number]  HPI Doing well Really happy with the phentermine Has lost about 20# Has been doing regular exercise at home and plans to join the [employer]  No tremors No headaches No chest pain No SOB  Not satisfied with the cialis levitra worked better  No problems with cholesterol med No myalgias or stomach trouble  Current Outpatient Prescriptions on File Prior to Visit  Medication Sig Dispense Refill  . aspirin 81 MG tablet Take 81 mg by mouth daily.        Marland Kitchen atorvastatin (LIPITOR) 20 MG tablet Take 20 mg by mouth daily.        . bisoprolol (ZEBETA) 10 MG tablet Take 10 mg by mouth daily.        . colchicine 0.6 MG tablet Take 0.6 mg by mouth daily.        Marland Kitchen desoximetasone (TOPICORT) 0.25 % cream APPLY TO RASH TWICE DAILY AS NEEDED  60 g  1  . lisinopril (PRINIVIL,ZESTRIL) 20 MG tablet TAKE 1 TABLET BY MOUTH EVERY DAY  90 tablet  0  . omeprazole (PRILOSEC) 20 MG capsule Take 20 mg by mouth daily.        . phentermine (ADIPEX-P) 37.5 MG tablet TAKE 1 TABLET BY MOUTH EVERY DAY TO DECREASE APPETITE  30 tablet  0  . tadalafil (CIALIS) 20 MG tablet Take one tablet by mouth about one hour before sex         Allergies  Allergen Reactions  . Codeine     Past Medical History  Diagnosis Date  . Hyperlipidemia   . Hypertension   . Osteoarthritis   . ED (erectile dysfunction)   . Obesity     Past Surgical History  Procedure Date  . Knee surgery     for bowlegs, age 67  . Total knee arthroplasty 2007    bilateral  . Patellar tendon repair 09/2008    left, Dr. Massie Maroon in [place]  . 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 Hyperlipidemia Mother   . Coronary artery disease Father   . Heart disease Father     CHF    History   Social History  . Marital Status: Married    Spouse Name: N/A    Number of Children: 2  . Years of Education:  N/A   Occupational History  . 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 Smoking status: Former Games developer  . Smokeless tobacco: Never Used   Comment: Quit in his 20's.  . Alcohol Use: No  . Drug Use: Not on file  . Sexually Active: Not on file   Other Topics Concern  . Not on file   Social History Narrative  . No narrative on file   Review of Systems Really feels happy Sleeps well---occ uses benedryl if overtired or stimulated Appetite is appropriate on the med    Objective:   Physical Exam  Constitutional: He appears well-developed and well-nourished. No distress.  Neck: Normal range of motion. Neck supple. No thyromegaly present.  Cardiovascular: Normal rate, regular rhythm, normal heart sounds and intact distal pulses.  Exam reveals no gallop.   No murmur heard. Pulmonary/Chest: Effort normal and breath sounds normal. No respiratory distress. He has no wheezes. He has no rales.  Abdominal: Soft. There is no tenderness.  Musculoskeletal: Normal range of motion. He exhibits no edema and no tenderness.  Lymphadenopathy:    He has no cervical adenopathy.  Psychiatric: He has a normal mood and affect. His behavior is normal. Judgment and thought content normal.          Assessment & Plan:

## 2011-03-03 ENCOUNTER — Other Ambulatory Visit: Payer: Self-pay | Admitting: Internal Medicine

## 2011-04-01 ENCOUNTER — Other Ambulatory Visit: Payer: Self-pay | Admitting: Internal Medicine

## 2011-04-07 ENCOUNTER — Other Ambulatory Visit: Payer: Self-pay | Admitting: Internal Medicine

## 2011-04-16 ENCOUNTER — Other Ambulatory Visit: Payer: Self-pay | Admitting: Internal Medicine

## 2011-07-07 ENCOUNTER — Other Ambulatory Visit: Payer: Self-pay | Admitting: Internal Medicine

## 2011-09-13 ENCOUNTER — Other Ambulatory Visit: Payer: Self-pay | Admitting: Internal Medicine

## 2011-09-13 NOTE — Telephone Encounter (Signed)
Okay #30 x 3 

## 2011-09-13 NOTE — Telephone Encounter (Signed)
rx called into pharmacy

## 2011-09-27 ENCOUNTER — Other Ambulatory Visit: Payer: Self-pay | Admitting: Internal Medicine

## 2011-12-25 ENCOUNTER — Other Ambulatory Visit: Payer: Self-pay | Admitting: Internal Medicine

## 2012-01-12 ENCOUNTER — Ambulatory Visit: Payer: PRIVATE HEALTH INSURANCE | Admitting: Internal Medicine

## 2012-02-04 ENCOUNTER — Ambulatory Visit (INDEPENDENT_AMBULATORY_CARE_PROVIDER_SITE_OTHER): Payer: PRIVATE HEALTH INSURANCE | Admitting: Internal Medicine

## 2012-02-04 ENCOUNTER — Encounter: Payer: Self-pay | Admitting: Internal Medicine

## 2012-02-04 VITALS — BP 128/80 | HR 62 | Temp 97.9°F | Ht 74.0 in | Wt 247.0 lb

## 2012-02-04 DIAGNOSIS — I10 Essential (primary) hypertension: Secondary | ICD-10-CM

## 2012-02-04 DIAGNOSIS — E669 Obesity, unspecified: Secondary | ICD-10-CM

## 2012-02-04 DIAGNOSIS — E785 Hyperlipidemia, unspecified: Secondary | ICD-10-CM

## 2012-02-04 LAB — HEPATIC FUNCTION PANEL
Alkaline Phosphatase: 85 U/L (ref 39–117)
Bilirubin, Direct: 0 mg/dL (ref 0.0–0.3)
Total Bilirubin: 0.9 mg/dL (ref 0.3–1.2)

## 2012-02-04 LAB — BASIC METABOLIC PANEL
BUN: 15 mg/dL (ref 6–23)
Chloride: 105 mEq/L (ref 96–112)
Creatinine, Ser: 1.2 mg/dL (ref 0.4–1.5)
GFR: 66.5 mL/min (ref >60.00)

## 2012-02-04 LAB — CBC WITH DIFFERENTIAL/PLATELET
Basophils Absolute: 0 10*3/uL (ref 0.0–0.1)
Eosinophils Absolute: 0.2 10*3/uL (ref 0.0–0.7)
Eosinophils Relative: 2.3 % (ref 0.0–5.0)
MCV: 87.1 fl (ref 78.0–100.0)
Monocytes Absolute: 0.7 10*3/uL (ref 0.1–1.0)
Neutrophils Relative %: 65.6 % (ref 43.0–77.0)
Platelets: 214 10*3/uL (ref 150.0–400.0)
RDW: 13.6 % (ref 11.5–14.6)
WBC: 8.5 10*3/uL (ref 4.5–10.5)

## 2012-02-04 LAB — TSH: TSH: 0.93 u[IU]/mL (ref 0.35–5.50)

## 2012-02-04 LAB — LIPID PANEL
Cholesterol: 141 mg/dL (ref 0–200)
LDL Cholesterol: 84 mg/dL (ref 0–99)
Triglycerides: 106 mg/dL (ref 0.0–149.0)
VLDL: 21.2 mg/dL (ref 0.0–40.0)

## 2012-02-04 MED ORDER — VARDENAFIL HCL 20 MG PO TABS: 20.0000 mg | ORAL_TABLET | Freq: Every day | ORAL | Status: DC | PRN

## 2012-02-04 MED ORDER — PHENTERMINE HCL 37.5 MG PO TABS: 37.5000 mg | ORAL_TABLET | Freq: Every day | ORAL | Status: DC

## 2012-02-04 NOTE — Assessment & Plan Note (Signed)
BP Readings from Last 3 Encounters:  02/04/12 128/80  02/23/11 140/90  09/15/10 150/103   Good control now with further weight loss Due for labs

## 2012-02-04 NOTE — Progress Notes (Signed)
Subjective:    Patient ID: Sean Garcia, male    DOB: 1951-05-28, 61 y.o.   MRN: [id number]  HPI Doing fairly well Weight down 7# more from last year though he was less at home Exercising regularly--walks [place] the [place], etc Still on the phentermine---really wants to continue  Doesn't regularly check BP occ in pharmacy--- 140-150/85 then No headaches No chest pain No SOB Exercise tolerance is good  No problems with atorvastatin No myalgias No stomach troubles  Ongoing problem with ED levitra does work though  Current Outpatient Prescriptions on File Prior to Visit  Medication Sig Dispense Refill  . aspirin 81 MG tablet Take 81 mg by mouth daily.        Marland Kitchen atorvastatin (LIPITOR) 20 MG tablet TAKE 1 TABLET BY MOUTH EVERY DAY  30 tablet  11  . bisoprolol (ZEBETA) 10 MG tablet TAKE 1 TABLET BY MOUTH EVERY DAY  30 tablet  11  . colchicine 0.6 MG tablet Take 0.6 mg by mouth daily.        Marland Kitchen desoximetasone (TOPICORT) 0.25 % cream APPLY TO RASH TWICE DAILY AS NEEDED  60 g  1  . lisinopril (PRINIVIL,ZESTRIL) 20 MG tablet TAKE 1 TABLET BY MOUTH EVERY DAY  90 tablet  0  . omeprazole (PRILOSEC) 20 MG capsule Take 20 mg by mouth daily.        . phentermine (ADIPEX-P) 37.5 MG tablet TAKE ONE TABLET BY MOUTH EVERY DAY BEFORE BREAKFAST  30 tablet  3    Allergies  Allergen Reactions  . Codeine     Past Medical History  Diagnosis Date  . Hyperlipidemia   . Hypertension   . Osteoarthritis   . ED (erectile dysfunction)   . Obesity     Past Surgical History  Procedure Date  . Knee surgery     for bowlegs, age 58  . Total knee arthroplasty 2007    bilateral  . Patellar tendon repair 09/2008    left, Dr. Massie Maroon in [place]  . 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 Hyperlipidemia Mother   . Coronary artery disease Father   . Heart disease Father     CHF    History   Social History  . Marital Status: Married   Spouse Name: N/A    Number of Children: 2  . Years of Education: N/A   Occupational History  . 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 Smoking status: Former Games developer  . Smokeless tobacco: Never Used   Comment: Quit in his 20's.  . Alcohol Use: No  . Drug Use: Not on file  . Sexually Active: Not on file   Other Topics Concern  . Not on file   Social History Narrative  . No narrative on file   Review of Systems Trouble starting urinary stream for some time Nocturia x 1 Bowels are fine    Objective:   Physical Exam  Constitutional: He appears well-developed and well-nourished. No distress.  Neck: Normal range of motion. Neck supple. No thyromegaly present.  Cardiovascular: Normal rate, regular rhythm, normal heart sounds and intact distal pulses.  Exam reveals no gallop.   No murmur heard. Pulmonary/Chest: Effort normal and breath sounds normal. No respiratory distress. He has no wheezes. He has no rales.  Musculoskeletal: He exhibits no edema and no tenderness.  Lymphadenopathy:  He has no cervical adenopathy.  Psychiatric: He has a normal mood and affect. His behavior is normal. Thought content normal.          Assessment & Plan:

## 2012-02-04 NOTE — Assessment & Plan Note (Signed)
Doing well on phentermine Discouraged with pace of weight loss--but still losing Will continue for now

## 2012-02-04 NOTE — Assessment & Plan Note (Signed)
Lab Results  Component Value Date   LDLCALC 79 02/23/2011   No problems with the med Due for labs

## 2012-02-07 ENCOUNTER — Encounter: Payer: Self-pay | Admitting: *Deleted

## 2012-02-14 ENCOUNTER — Other Ambulatory Visit: Payer: Self-pay | Admitting: *Deleted

## 2012-02-14 MED ORDER — COLCHICINE 0.6 MG PO TABS: 0.6000 mg | ORAL_TABLET | Freq: Every day | ORAL | Status: DC

## 2012-02-14 NOTE — Telephone Encounter (Signed)
Pt requested refill on cholchicine.

## 2012-03-22 ENCOUNTER — Other Ambulatory Visit: Payer: Self-pay | Admitting: Internal Medicine

## 2012-03-27 ENCOUNTER — Other Ambulatory Visit: Payer: Self-pay | Admitting: Internal Medicine

## 2012-04-10 ENCOUNTER — Encounter: Payer: Self-pay | Admitting: Internal Medicine

## 2012-04-10 ENCOUNTER — Ambulatory Visit (INDEPENDENT_AMBULATORY_CARE_PROVIDER_SITE_OTHER): Payer: PRIVATE HEALTH INSURANCE | Admitting: Internal Medicine

## 2012-04-10 VITALS — BP 122/80 | HR 60 | Temp 97.8°F | Wt 249.0 lb

## 2012-04-10 DIAGNOSIS — J209 Acute bronchitis, unspecified: Secondary | ICD-10-CM

## 2012-04-10 MED ORDER — DOXYCYCLINE HYCLATE 100 MG PO TABS: 100.0000 mg | ORAL_TABLET | Freq: Two times a day (BID) | ORAL | Status: DC

## 2012-04-10 MED ORDER — PREDNISONE 20 MG PO TABS: 40.0000 mg | ORAL_TABLET | Freq: Every day | ORAL | Status: DC

## 2012-04-10 NOTE — Assessment & Plan Note (Signed)
May have atypical infection with the apparent bronchial spasm Will treat with doxy and prednisone

## 2012-04-10 NOTE — Progress Notes (Signed)
Subjective:    Patient ID: Sean Garcia, male    DOB: Mar 09, 1951, 61 y.o.   MRN: [id number]  HPI Has had chest symptoms for about 2 weeks Congestion despite coridicin OTC Persistent cough and feels washed out Mild sputum production  Feels tired May have had some low grade fever at night--has had some sweats No SOB Sputum is greenish yellow  Some sore throat from cough No otalgia Mild headache No sig nasal congestion but did have some sinus tightness this AM  No other meds except coridin  Current Outpatient Prescriptions on File Prior to Visit  Medication Sig Dispense Refill  . aspirin 81 MG tablet Take 81 mg by mouth daily.        Marland Kitchen atorvastatin (LIPITOR) 20 MG tablet TAKE 1 TABLET BY MOUTH EVERY DAY  30 tablet  11  . bisoprolol (ZEBETA) 10 MG tablet TAKE 1 TABLET BY MOUTH EVERY DAY  30 tablet  11  . colchicine 0.6 MG tablet Take 1 tablet (0.6 mg total) by mouth daily.  30 tablet  3  . desoximetasone (TOPICORT) 0.25 % cream APPLY TO RASH TWICE DAILY AS NEEDED  60 g  1  . lisinopril (PRINIVIL,ZESTRIL) 20 MG tablet TAKE 1 TABLET BY MOUTH EVERY DAY  90 tablet  0  . omeprazole (PRILOSEC) 20 MG capsule Take 20 mg by mouth daily.        Marland Kitchen DISCONTD: phentermine 37.5 MG capsule Take 1 capsule (37.5 mg total) by mouth every morning.  30 capsule  1  . DISCONTD: vardenafil (LEVITRA) 20 MG tablet Take 1 tablet (20 mg total) by mouth daily as needed for erectile dysfunction.  6 tablet  5  . DISCONTD: phentermine (ADIPEX-P) 37.5 MG tablet Take 1 tablet (37.5 mg total) by mouth daily before breakfast.  30 tablet  5  . DISCONTD: vardenafil (LEVITRA) 20 MG tablet Take 1 tablet (20 mg total) by mouth daily as needed for erectile dysfunction.  10 tablet  11    Allergies  Allergen Reactions  . Codeine     Past Medical History  Diagnosis Date  . Hyperlipidemia   . Hypertension   . Osteoarthritis   . ED (erectile dysfunction)   . Obesity     Past Surgical History  Procedure Date  .  Knee surgery     for bowlegs, age 87  . Total knee arthroplasty 2007    bilateral  . Patellar tendon repair 09/2008    left, Dr. Massie Maroon in [place]  . 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 Hyperlipidemia Mother   . Coronary artery disease Father   . Heart disease Father     CHF    History   Social History  . Marital Status: Married    Spouse Name: N/A    Number of Children: 2  . Years of Education: N/A   Occupational History  . 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 Smoking status: Former Games developer  . Smokeless tobacco: Never Used   Comment: Quit in his 20's.  . Alcohol Use: No  . Drug Use: Not on file  . Sexually Active: Not on file   Other Topics Concern  . Not on file   Social History Narrative  . No narrative on file   Review of Systems No rash No N/V/diarrhea Appetite off slightly but eating okay  Objective:   Physical Exam  Constitutional: He appears well-developed and well-nourished. No distress.       Coarse cough  HENT:  Mouth/Throat: Oropharynx is clear and moist. No oropharyngeal exudate.       No sinus tenderness TMs normal Mild nasal inflammation  Neck: Normal range of motion. Neck supple.  Pulmonary/Chest: Effort normal. No respiratory distress. He has wheezes. He has no rales.       Slight prolonged expiratory phase and wheezes  Lymphadenopathy:    He has no cervical adenopathy.          Assessment & Plan:

## 2012-04-14 ENCOUNTER — Other Ambulatory Visit: Payer: Self-pay | Admitting: Internal Medicine

## 2012-04-25 ENCOUNTER — Encounter: Payer: Self-pay | Admitting: Internal Medicine

## 2012-04-25 ENCOUNTER — Ambulatory Visit (INDEPENDENT_AMBULATORY_CARE_PROVIDER_SITE_OTHER): Payer: PRIVATE HEALTH INSURANCE | Admitting: Internal Medicine

## 2012-04-25 VITALS — BP 140/70 | HR 54 | Temp 98.5°F | Wt 248.0 lb

## 2012-04-25 DIAGNOSIS — T148XXA Other injury of unspecified body region, initial encounter: Secondary | ICD-10-CM | POA: Insufficient documentation

## 2012-04-25 NOTE — Assessment & Plan Note (Signed)
Medial to biceps No loss of function Discussed rest, aleve  Hold off on active use of arm for a while

## 2012-04-25 NOTE — Progress Notes (Signed)
Subjective:    Patient ID: Sean Garcia, male    DOB: 06-13-1951, 61 y.o.   MRN: [id number]  HPI Having right arm pain Wife wanted him seen  Playing golf 10/4 Hit shot and felt pulling in right arm Had to quit several holes later due to pain Has had ongoing pain Noted bruise fairly quickly and it has spread down Had iced intermittently and used aleve  Feels pain mostly in forearm and distal arm medially  Current Outpatient Prescriptions on File Prior to Visit  Medication Sig Dispense Refill  . aspirin 81 MG tablet Take 81 mg by mouth daily.        Marland Kitchen atorvastatin (LIPITOR) 20 MG tablet TAKE 1 TABLET BY MOUTH EVERY DAY  30 tablet  11  . bisoprolol (ZEBETA) 10 MG tablet TAKE 1 TABLET BY MOUTH EVERY DAY  30 tablet  11  . colchicine 0.6 MG tablet Take 1 tablet (0.6 mg total) by mouth daily.  30 tablet  3  . desoximetasone (TOPICORT) 0.25 % cream APPLY TO RASH TWICE DAILY AS NEEDED  60 g  1  . doxycycline (VIBRA-TABS) 100 MG tablet Take 1 tablet (100 mg total) by mouth 2 (two) times daily.  20 tablet  0  . lisinopril (PRINIVIL,ZESTRIL) 20 MG tablet TAKE 1 TABLET BY MOUTH EVERY DAY  90 tablet  0  . omeprazole (PRILOSEC) 20 MG capsule Take 20 mg by mouth daily.        . phentermine 37.5 MG capsule Take 37.5 mg by mouth every morning.      . predniSONE (DELTASONE) 20 MG tablet Take 2 tablets (40 mg total) by mouth daily.  15 tablet  0  . vardenafil (LEVITRA) 20 MG tablet Take 20 mg by mouth daily as needed.      Marland Kitchen DISCONTD: vardenafil (LEVITRA) 20 MG tablet Take 20 mg by mouth daily as needed.        Allergies  Allergen Reactions  . Codeine     Past Medical History  Diagnosis Date  . Hyperlipidemia   . Hypertension   . Osteoarthritis   . ED (erectile dysfunction)   . Obesity     Past Surgical History  Procedure Date  . Knee surgery     for bowlegs, age 70  . Total knee arthroplasty 2007    bilateral  . Patellar tendon repair 09/2008    left, Dr. Massie Maroon in [place]  .  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 Hyperlipidemia Mother   . Coronary artery disease Father   . Heart disease Father     CHF    History   Social History  . Marital Status: Married    Spouse Name: N/A    Number of Children: 2  . Years of Education: N/A   Occupational History  . 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 Smoking status: Former Games developer  . Smokeless tobacco: Never Used   Comment: Quit in his 20's.  . Alcohol Use: No  . Drug Use: Not on file  . Sexually Active: Not on file   Other Topics Concern  . Not on file   Social History Narrative  . No narrative on file   Review of Systems No decrease in ROM though he initially had trouble extending arm while shoulder partially abducted No fever    Objective:   Physical  Exam  Constitutional: He appears well-developed and well-nourished. No distress.  Musculoskeletal:       Extensive bruising along medial right arm (distally) and most of medial forearm No sig tenderness No marked tenderness  Neurological:       Normal active ROM and strength in right arm/forearm/wrist and hand          Assessment & Plan:

## 2012-05-18 ENCOUNTER — Other Ambulatory Visit: Payer: Self-pay | Admitting: *Deleted

## 2012-05-18 MED ORDER — LISINOPRIL 20 MG PO TABS: 20.0000 mg | ORAL_TABLET | Freq: Every day | ORAL | Status: DC

## 2012-05-24 ENCOUNTER — Encounter: Payer: Self-pay | Admitting: Internal Medicine

## 2012-05-24 ENCOUNTER — Ambulatory Visit (INDEPENDENT_AMBULATORY_CARE_PROVIDER_SITE_OTHER): Payer: PRIVATE HEALTH INSURANCE | Admitting: Internal Medicine

## 2012-05-24 VITALS — BP 138/80 | HR 80 | Temp 98.6°F | Resp 20 | Ht 74.0 in | Wt 249.5 lb

## 2012-05-24 DIAGNOSIS — J019 Acute sinusitis, unspecified: Secondary | ICD-10-CM | POA: Insufficient documentation

## 2012-05-24 MED ORDER — AMOXICILLIN 500 MG PO TABS: 1000.0000 mg | ORAL_TABLET | Freq: Two times a day (BID) | ORAL | Status: DC

## 2012-05-24 NOTE — Progress Notes (Signed)
Subjective:    Patient ID: Sean Garcia, male    DOB: 04/20/1951, 61 y.o.   MRN: [id number]  HPI Now has sinus infection Started about 5 days ago Incredible frontal and maxillary pressure Hoarse voice  Thick green phlegm ---from nose and some with cough Cough is increasing---day and night  No fever Some ear pain---especially in AM with swallowing Feels listless No SOB  Has tried coricidin Current Outpatient Prescriptions on File Prior to Visit  Medication Sig Dispense Refill  . aspirin 81 MG tablet Take 81 mg by mouth daily.        Marland Kitchen atorvastatin (LIPITOR) 20 MG tablet TAKE 1 TABLET BY MOUTH EVERY DAY  30 tablet  11  . bisoprolol (ZEBETA) 10 MG tablet TAKE 1 TABLET BY MOUTH EVERY DAY  30 tablet  11  . desoximetasone (TOPICORT) 0.25 % cream APPLY TO RASH TWICE DAILY AS NEEDED  60 g  1  . lisinopril (PRINIVIL,ZESTRIL) 20 MG tablet Take 1 tablet (20 mg total) by mouth daily.  90 tablet  3  . phentermine 37.5 MG capsule Take 37.5 mg by mouth every morning.      . colchicine 0.6 MG tablet Take 1 tablet (0.6 mg total) by mouth daily.  30 tablet  3  . doxycycline (VIBRA-TABS) 100 MG tablet Take 1 tablet (100 mg total) by mouth 2 (two) times daily.  20 tablet  0  . omeprazole (PRILOSEC) 20 MG capsule Take 20 mg by mouth daily.        . predniSONE (DELTASONE) 20 MG tablet Take 2 tablets (40 mg total) by mouth daily.  15 tablet  0  . vardenafil (LEVITRA) 20 MG tablet Take 20 mg by mouth daily as needed.        Allergies  Allergen Reactions  . Codeine     Past Medical History  Diagnosis Date  . Hyperlipidemia   . Hypertension   . Osteoarthritis   . ED (erectile dysfunction)   . Obesity     Past Surgical History  Procedure Date  . Knee surgery     for bowlegs, age 13  . Total knee arthroplasty 2007    bilateral  . Patellar tendon repair 09/2008    left, Dr. Massie Maroon in [place]  . 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 Hyperlipidemia Mother   . Coronary artery disease Father   . Heart disease Father     CHF    History   Social History  . Marital Status: Married    Spouse Name: N/A    Number of Children: 2  . Years of Education: N/A   Occupational History  . 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 Smoking status: Former Games developer  . Smokeless tobacco: Never Used     Comment: Quit in his 20's.  . Alcohol Use: No  . Drug Use: Not on file  . Sexually Active: Not on file   Other Topics Concern  . Not on file   Social History Narrative  . No narrative on file   Review of Systems Arm did get better Trouble sleeping due to illness No rash No vomiting or diarrhea    Objective:   Physical Exam  Constitutional: He appears well-developed and well-nourished. No distress.  HENT:       Slight maxillary sensitivity Mild nasal inflammation with slight thick yellow  mucus Mild pharyngeal injection without exudate TMs normal  Neck: Normal range of motion. Neck supple.  Pulmonary/Chest: Effort normal and breath sounds normal. No respiratory distress. He has no wheezes. He has no rales.  Lymphadenopathy:    He has no cervical adenopathy.          Assessment & Plan:

## 2012-05-24 NOTE — Assessment & Plan Note (Signed)
May still be viral Discussed supportive care---esp analgesics for the feeling bad Fill and take amoxil if worsens or persists

## 2012-06-09 ENCOUNTER — Other Ambulatory Visit: Payer: Self-pay | Admitting: Internal Medicine

## 2012-08-09 ENCOUNTER — Ambulatory Visit: Payer: PRIVATE HEALTH INSURANCE | Admitting: Internal Medicine

## 2012-10-04 ENCOUNTER — Encounter: Payer: Self-pay | Admitting: Internal Medicine

## 2012-10-04 ENCOUNTER — Ambulatory Visit (INDEPENDENT_AMBULATORY_CARE_PROVIDER_SITE_OTHER): Payer: BC Managed Care – PPO | Admitting: Internal Medicine

## 2012-10-04 VITALS — BP 132/84 | HR 68 | Temp 98.1°F | Wt 254.0 lb

## 2012-10-04 DIAGNOSIS — E669 Obesity, unspecified: Secondary | ICD-10-CM

## 2012-10-04 DIAGNOSIS — J019 Acute sinusitis, unspecified: Secondary | ICD-10-CM

## 2012-10-04 DIAGNOSIS — I10 Essential (primary) hypertension: Secondary | ICD-10-CM

## 2012-10-04 DIAGNOSIS — E785 Hyperlipidemia, unspecified: Secondary | ICD-10-CM

## 2012-10-04 MED ORDER — PHENTERMINE HCL 37.5 MG PO CAPS: 37.5000 mg | ORAL_CAPSULE | ORAL | Status: DC

## 2012-10-04 NOTE — Assessment & Plan Note (Signed)
No problems with meds 

## 2012-10-04 NOTE — Progress Notes (Signed)
Subjective:    Patient ID: Sean Garcia, male    DOB: 04/20/1951, 62 y.o.   MRN: [id number]  HPI Did need the antibiotic after the last visit Now sick again Started last week when the power went out 5 days ago Sore throat yesterday Ear and sinus pressure Evening cough No fever No SOB  Still on the phentermine Wants a refill Weight is up a few pounds from last time Feels it is due to not being as active Plans to get back to golf---is going to walk the course Has some exercise equipment now  No chest pain No SOB No dizziness or syncope No edema  Current Outpatient Prescriptions on File Prior to Visit  Medication Sig Dispense Refill  . aspirin 81 MG tablet Take 81 mg by mouth daily.        Marland Kitchen atorvastatin (LIPITOR) 20 MG tablet TAKE 1 TABLET BY MOUTH EVERY DAY  30 tablet  11  . bisoprolol (ZEBETA) 10 MG tablet TAKE 1 TABLET BY MOUTH EVERY DAY  30 tablet  11  . colchicine 0.6 MG tablet Take 1 tablet (0.6 mg total) by mouth daily.  30 tablet  3  . desoximetasone (TOPICORT) 0.25 % cream APPLY TO RASH TWICE DAILY AS NEEDED  60 g  1  . doxycycline (VIBRA-TABS) 100 MG tablet Take 1 tablet (100 mg total) by mouth 2 (two) times daily.  20 tablet  0  . lisinopril (PRINIVIL,ZESTRIL) 20 MG tablet Take 1 tablet (20 mg total) by mouth daily.  90 tablet  3  . omeprazole (PRILOSEC) 20 MG capsule Take 20 mg by mouth daily.        . phentermine 37.5 MG capsule Take 37.5 mg by mouth every morning.      . vardenafil (LEVITRA) 20 MG tablet Take 20 mg by mouth daily as needed.       No current facility-administered medications on file prior to visit.    Allergies  Allergen Reactions  . Codeine     Past Medical History  Diagnosis Date  . Hyperlipidemia   . Hypertension   . Osteoarthritis   . ED (erectile dysfunction)   . Obesity     Past Surgical History  Procedure Laterality Date  . Knee surgery      for bowlegs, age 44  . Total knee arthroplasty  2007    bilateral  . Patellar  tendon repair  09/2008    left, Dr. Massie Maroon in [place]  . 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 Hyperlipidemia Mother   . Coronary artery disease Father   . Heart disease Father     CHF    History   Social History  . Marital Status: Married    Spouse Name: N/A    Number of Children: 2  . Years of Education: N/A   Occupational History  . 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 Smoking status: Former Games developer  . Smokeless tobacco: Never Used     Comment: Quit in his 20's.  . Alcohol Use: No  . Drug Use: Not on file  . Sexually Active: Not on file   Other Topics Concern  . Not on file   Social History Narrative  . No narrative on file   Review of Systems Some AM hand pain---advil will help Sleeps okay    Objective:  Physical Exam  Constitutional: Sean Garcia appears well-developed and well-nourished. No distress.  HENT:  Mouth/Throat: Oropharynx is clear and moist. No oropharyngeal exudate.  No sinus tenderness Vascular congestion under left eye Moderate nasal inflammation TMs normal  Neck: Normal range of motion. Neck supple. No thyromegaly present.  Cardiovascular: Normal rate, regular rhythm and normal heart sounds.  Exam reveals no gallop.   No murmur heard. Pulmonary/Chest: Effort normal and breath sounds normal. No respiratory distress. Sean Garcia has no wheezes. Sean Garcia has no rales.  Musculoskeletal: Sean Garcia exhibits no edema and no tenderness.  Lymphadenopathy:    Sean Garcia has no cervical adenopathy.  Psychiatric: Sean Garcia has a normal mood and affect. His behavior is normal.          Assessment & Plan:

## 2012-10-04 NOTE — Assessment & Plan Note (Signed)
Will continue phentermine

## 2012-10-04 NOTE — Assessment & Plan Note (Signed)
BP Readings from Last 3 Encounters:  10/04/12 132/84  05/24/12 138/80  04/25/12 140/70   Good control No changes needed

## 2012-10-04 NOTE — Patient Instructions (Signed)
Please let me know if you are not getting better by next week

## 2012-10-04 NOTE — Assessment & Plan Note (Signed)
Seems viral  Only for a couple of days He will let me know if he isn't better next week

## 2013-02-01 ENCOUNTER — Ambulatory Visit (INDEPENDENT_AMBULATORY_CARE_PROVIDER_SITE_OTHER): Payer: BC Managed Care – PPO | Admitting: Internal Medicine

## 2013-02-01 ENCOUNTER — Encounter: Payer: Self-pay | Admitting: Internal Medicine

## 2013-02-01 VITALS — BP 140/80 | HR 67 | Temp 98.3°F | Wt 256.0 lb

## 2013-02-01 DIAGNOSIS — R040 Epistaxis: Secondary | ICD-10-CM | POA: Insufficient documentation

## 2013-02-01 NOTE — Progress Notes (Signed)
Subjective:    Patient ID: Sean Garcia, male    DOB: 12-02-1950, 62 y.o.   MRN: [id number]  HPI Awoke about 10 days ago---nose was running and it was blood Pinched his nostrils and it stopped No problems after that till 3 days ago Awoke and could taste blood and then running blood again (from right nare) Able to stop it again Recurred yesterday also Always at night and it gets stuck in there--has to spit it out Last night-slept in recliner and it didn't really bleed  Has some pressure in his head No persistent headache Tried advil for this  Never has had nosebleeds again  Did try some afrin 2 days ago No cortisone nasal sprays  Did coat his right nose with vaseline yesterday No trauma to nose  Current Outpatient Prescriptions on File Prior to Visit  Medication Sig Dispense Refill  . aspirin 81 MG tablet Take 81 mg by mouth daily.        Marland Kitchen atorvastatin (LIPITOR) 20 MG tablet TAKE 1 TABLET BY MOUTH EVERY DAY  30 tablet  11  . bisoprolol (ZEBETA) 10 MG tablet TAKE 1 TABLET BY MOUTH EVERY DAY  30 tablet  11  . colchicine 0.6 MG tablet Take 1 tablet (0.6 mg total) by mouth daily.  30 tablet  3  . desoximetasone (TOPICORT) 0.25 % cream APPLY TO RASH TWICE DAILY AS NEEDED  60 g  1  . lisinopril (PRINIVIL,ZESTRIL) 20 MG tablet Take 1 tablet (20 mg total) by mouth daily.  90 tablet  3  . phentermine 37.5 MG capsule Take 1 capsule (37.5 mg total) by mouth every morning.  30 capsule  5  . vardenafil (LEVITRA) 20 MG tablet Take 20 mg by mouth daily as needed.       No current facility-administered medications on file prior to visit.    Allergies  Allergen Reactions  . Codeine     Past Medical History  Diagnosis Date  . Hyperlipidemia   . Hypertension   . Osteoarthritis   . ED (erectile dysfunction)   . Obesity     Past Surgical History  Procedure Laterality Date  . Knee surgery      for bowlegs, age 34  . Total knee arthroplasty  2007    bilateral  . Patellar tendon  repair  09/2008    left, Dr. Massie Maroon in [place]  . 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 Hyperlipidemia Mother   . Coronary artery disease Father   . Heart disease Father     CHF    History   Social History  . Marital Status: Married    Spouse Name: N/A    Number of Children: 2  . Years of Education: N/A   Occupational History  . 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 Smoking status: Former Games developer  . Smokeless tobacco: Never Used     Comment: Quit in his 20's.  . Alcohol Use: No  . Drug Use: Not on file  . Sexually Active: Not on file   Other Topics Concern  . Not on file   Social History Narrative  . No narrative on file   Review of Systems Does pluck hair from nose at times Not sick No fever     Objective:   Physical Exam  HENT:  Mouth/Throat: Oropharynx is clear and moist.  No oropharyngeal exudate.  Moderate swelling in nose  No bleeding spot seen          Assessment & Plan:

## 2013-02-01 NOTE — Assessment & Plan Note (Signed)
Reassured that it doesn't seem to be something serious Will set up with ENT to evaluate and cauterize prn

## 2013-02-02 ENCOUNTER — Emergency Department: Payer: Self-pay | Admitting: Emergency Medicine

## 2013-02-04 ENCOUNTER — Emergency Department: Payer: Self-pay | Admitting: Emergency Medicine

## 2013-02-05 ENCOUNTER — Telehealth: Payer: Self-pay

## 2013-02-05 NOTE — Telephone Encounter (Signed)
Pt seen by Dr Alphonsus Sias 02/01/13; pt seen [facility] ED on 02/02/13 and 02/04/13 with nose bleed and elevated BP. 02/04/13 pt saw Dr Elenore Rota ENT and nose was packed and advised pt if continue to bleed would require surgical procedure but needed to contact PCP about hypertension; pt was to be on bed rest and not even to go to PCP's office. Cindy said highest BP has been was 187/114; this morning 6:50 am BP 152/108 P 93 and    9 am BP 153/100 P 88. Pt taking Bisoprolol 10 mg one daily and lisinopril 20 mg one daily, pt has not missed medication and at ED on 02/04/13 was given extra lisinopril 10 mg. No h/a or dizziness but nose still bleeding down back of throat even thou nose is packed; advised Arline Asp needs to contact ENT to advise that.Cindy request BP meds to be adjusted. [facility] [employer]. Cindy request cb ASAP. [facility] ED records requested).

## 2013-02-05 NOTE — Telephone Encounter (Signed)
[facility] notes on your desk

## 2013-02-05 NOTE — Telephone Encounter (Signed)
Please let him know it is very common for BP to be elevated when having nosebleeds. It was not really up when here though.  I would recommend for now that he take his lisinopril 20mg  twice a day--instead of once a day. When his nose settles down, I will have to see him to review the BP If he gets dizzy upon standing while on the higher dose, he should go back to just once a day

## 2013-02-05 NOTE — Telephone Encounter (Signed)
Spoke with wife and advised instructions, they will try the higher dose and call if any problems.

## 2013-02-07 ENCOUNTER — Encounter: Payer: Self-pay | Admitting: Internal Medicine

## 2013-02-07 ENCOUNTER — Ambulatory Visit (INDEPENDENT_AMBULATORY_CARE_PROVIDER_SITE_OTHER): Payer: BC Managed Care – PPO | Admitting: Internal Medicine

## 2013-02-07 VITALS — BP 100/60 | HR 85 | Temp 98.0°F | Wt 251.0 lb

## 2013-02-07 DIAGNOSIS — I10 Essential (primary) hypertension: Secondary | ICD-10-CM

## 2013-02-07 DIAGNOSIS — I959 Hypotension, unspecified: Secondary | ICD-10-CM

## 2013-02-07 NOTE — Assessment & Plan Note (Signed)
Clearly had hypotensive episode Likely systolic under 100 and apparent syncope in shower Back to daily lisinopril of 20  No further epistaxis in 2 days

## 2013-02-07 NOTE — Assessment & Plan Note (Signed)
BP Readings from Last 3 Encounters:  02/07/13 100/60  02/01/13 140/80  10/04/12 132/84   Repeat 110/62 on right Will resume previous dosing

## 2013-02-07 NOTE — Progress Notes (Signed)
Subjective:    Patient ID: Sean Garcia, male    DOB: 12-17-1950, 62 y.o.   MRN: [id number]  HPI Wife is here Reviewed his epistaxis history Had discussed with ENT  Did increase the lisinopril to 40mg  daily Bottomed out last night in shower--- thinks he may have briefly passed out Was white as a sheet  Called rescue On floor with feet elevated then BP then 112/70 Got him to recliner--didn't go to hospital  Current Outpatient Prescriptions on File Prior to Visit  Medication Sig Dispense Refill  . aspirin 81 MG tablet Take 81 mg by mouth daily.        Marland Kitchen atorvastatin (LIPITOR) 20 MG tablet TAKE 1 TABLET BY MOUTH EVERY DAY  30 tablet  11  . bisoprolol (ZEBETA) 10 MG tablet TAKE 1 TABLET BY MOUTH EVERY DAY  30 tablet  11  . colchicine 0.6 MG tablet Take 1 tablet (0.6 mg total) by mouth daily.  30 tablet  3  . desoximetasone (TOPICORT) 0.25 % cream APPLY TO RASH TWICE DAILY AS NEEDED  60 g  1  . lisinopril (PRINIVIL,ZESTRIL) 20 MG tablet Take 1 tablet (20 mg total) by mouth daily.  90 tablet  3  . phentermine 37.5 MG capsule Take 1 capsule (37.5 mg total) by mouth every morning.  30 capsule  5  . vardenafil (LEVITRA) 20 MG tablet Take 20 mg by mouth daily as needed.       No current facility-administered medications on file prior to visit.    Allergies  Allergen Reactions  . Codeine     Past Medical History  Diagnosis Date  . Hyperlipidemia   . Hypertension   . Osteoarthritis   . ED (erectile dysfunction)   . Obesity     Past Surgical History  Procedure Laterality Date  . Knee surgery      for bowlegs, age 52  . Total knee arthroplasty  2007    bilateral  . Patellar tendon repair  09/2008    left, Dr. Massie Maroon in [place]  . 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 Hyperlipidemia Mother   . Coronary artery disease Father   . Heart disease Father     CHF    History   Social History  . Marital Status:  Married    Spouse Name: N/A    Number of Children: 2  . Years of Education: N/A   Occupational History  . 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 Smoking status: Former Games developer  . Smokeless tobacco: Never Used     Comment: Quit in his 20's.  . Alcohol Use: No  . Drug Use: Not on file  . Sexually Active: Not on file   Other Topics Concern  . Not on file   Social History Narrative  . No narrative on file   Review of Systems Hasn't been taking the phentermine Nosebleed has finally stopped--none in past 2 days    Objective:   Physical Exam  Constitutional: He appears well-developed.  Neck: Normal range of motion. Neck supple.  Cardiovascular: Normal rate, regular rhythm and normal heart sounds.  Exam reveals no gallop.   No murmur heard. Pulmonary/Chest: Effort normal and breath sounds normal. No respiratory distress. He has no wheezes. He has no rales.  Lymphadenopathy:    He has no cervical adenopathy.  Psychiatric: He has  a normal mood and affect. His behavior is normal.          Assessment & Plan:

## 2013-02-07 NOTE — Patient Instructions (Addendum)
Please go back to lisinopril just 20mg  once a day

## 2013-02-21 ENCOUNTER — Other Ambulatory Visit: Payer: Self-pay | Admitting: Internal Medicine

## 2013-03-23 ENCOUNTER — Other Ambulatory Visit: Payer: Self-pay | Admitting: Internal Medicine

## 2013-03-26 NOTE — Telephone Encounter (Signed)
Received refill request electronically. See drug warning. Is it okay to refill?

## 2013-03-27 NOTE — Telephone Encounter (Signed)
Rarely uses the [facility] Rx sent

## 2013-04-11 ENCOUNTER — Encounter: Payer: BC Managed Care – PPO | Admitting: Internal Medicine

## 2013-04-16 ENCOUNTER — Other Ambulatory Visit: Payer: Self-pay | Admitting: Internal Medicine

## 2013-05-14 ENCOUNTER — Other Ambulatory Visit: Payer: Self-pay | Admitting: Internal Medicine

## 2013-05-24 ENCOUNTER — Other Ambulatory Visit: Payer: Self-pay

## 2013-06-07 ENCOUNTER — Other Ambulatory Visit: Payer: Self-pay | Admitting: Internal Medicine

## 2013-06-11 ENCOUNTER — Ambulatory Visit (INDEPENDENT_AMBULATORY_CARE_PROVIDER_SITE_OTHER): Payer: BC Managed Care – PPO | Admitting: Internal Medicine

## 2013-06-11 ENCOUNTER — Encounter: Payer: Self-pay | Admitting: Internal Medicine

## 2013-06-11 VITALS — BP 140/90 | HR 73 | Temp 98.6°F | Ht 74.0 in | Wt 246.0 lb

## 2013-06-11 DIAGNOSIS — Z0000 Encounter for general adult medical examination without abnormal findings: Secondary | ICD-10-CM

## 2013-06-11 DIAGNOSIS — E785 Hyperlipidemia, unspecified: Secondary | ICD-10-CM

## 2013-06-11 DIAGNOSIS — I10 Essential (primary) hypertension: Secondary | ICD-10-CM

## 2013-06-11 DIAGNOSIS — Z125 Encounter for screening for malignant neoplasm of prostate: Secondary | ICD-10-CM

## 2013-06-11 LAB — LIPID PANEL
Cholesterol: 123 mg/dL (ref 0–200)
HDL: 32.8 mg/dL — ABNORMAL LOW (ref >39.00)
Triglycerides: 56 mg/dL (ref 0.0–149.0)

## 2013-06-11 LAB — CBC WITH DIFFERENTIAL/PLATELET
Basophils Absolute: 0 10*3/uL (ref 0.0–0.1)
Basophils Relative: 0.4 % (ref 0.0–3.0)
Eosinophils Absolute: 0.2 10*3/uL (ref 0.0–0.7)
HCT: 45.7 % (ref 39.0–52.0)
Hemoglobin: 15.3 g/dL (ref 13.0–17.0)
Lymphocytes Relative: 25 % (ref 12.0–46.0)
Lymphs Abs: 2 10*3/uL (ref 0.7–4.0)
MCHC: 33.5 g/dL (ref 30.0–36.0)
MCV: 82.7 fl (ref 78.0–100.0)
Monocytes Absolute: 0.7 10*3/uL (ref 0.1–1.0)
Neutro Abs: 5.1 10*3/uL (ref 1.4–7.7)
RBC: 5.53 Mil/uL (ref 4.22–5.81)
RDW: 14.3 % (ref 11.5–14.6)

## 2013-06-11 LAB — HEPATIC FUNCTION PANEL
ALT: 22 U/L (ref 0–53)
Albumin: 3.9 g/dL (ref 3.5–5.2)
Bilirubin, Direct: 0.2 mg/dL (ref 0.0–0.3)
Total Protein: 6.8 g/dL (ref 6.0–8.3)

## 2013-06-11 LAB — BASIC METABOLIC PANEL
CO2: 30 mEq/L (ref 19–32)
Calcium: 10.1 mg/dL (ref 8.4–10.5)
Chloride: 103 mEq/L (ref 96–112)
GFR: 73.25 mL/min (ref >60.00)
Glucose, Bld: 94 mg/dL (ref 70–99)
Sodium: 139 mEq/L (ref 135–145)

## 2013-06-11 LAB — PSA: PSA: 0.22 ng/mL (ref 0.10–4.00)

## 2013-06-11 MED ORDER — PHENTERMINE HCL 37.5 MG PO CAPS: 37.5000 mg | ORAL_CAPSULE | ORAL | Status: DC

## 2013-06-11 NOTE — Progress Notes (Signed)
Pre-visit discussion using our clinic review tool. No additional management support is needed unless otherwise documented below in the visit note.  

## 2013-06-11 NOTE — Addendum Note (Signed)
Addended by: Sueanne Margarita on: 06/11/2013 11:23 AM   Modules accepted: Orders

## 2013-06-11 NOTE — Progress Notes (Signed)
Subjective:    Patient ID: Sean Garcia, male    DOB: 01/01/1951, 62 y.o.   MRN: [id number]  HPI Here for physical No more fainting or dizzy spells No recurrence of the nosebleeds  trying to do some exercise Weight is down 3#  Same job Mom now with lymphoma --seems to be a treatable one  Current Outpatient Prescriptions on File Prior to Visit  Medication Sig Dispense Refill  . atorvastatin (LIPITOR) 20 MG tablet TAKE 1 TABLET BY MOUTH EVERY DAY  30 tablet  11  . bisoprolol (ZEBETA) 10 MG tablet TAKE 1 TABLET BY MOUTH EVERY DAY  30 tablet  0  . COLCRYS 0.6 MG tablet TAKE 1 TABLET BY MOUTH EVERY DAY  30 tablet  0  . desoximetasone (TOPICORT) 0.25 % cream APPLY TO RASH TWICE DAILY AS NEEDED  60 g  1  . lisinopril (PRINIVIL,ZESTRIL) 20 MG tablet Take 1 tablet (20 mg total) by mouth daily.  90 tablet  3  . phentermine 37.5 MG capsule Take 1 capsule (37.5 mg total) by mouth every morning.  30 capsule  5  . vardenafil (LEVITRA) 20 MG tablet Take 20 mg by mouth daily as needed.       No current facility-administered medications on file prior to visit.    Allergies  Allergen Reactions  . Codeine     Past Medical History  Diagnosis Date  . Hyperlipidemia   . Hypertension   . Osteoarthritis   . ED (erectile dysfunction)   . Obesity     Past Surgical History  Procedure Laterality Date  . Knee surgery      for bowlegs, age 24  . Total knee arthroplasty  2007    bilateral  . Patellar tendon repair  09/2008    left, Dr. Massie Maroon in [place]  . 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 Hyperlipidemia Mother   . Cancer Mother     lymphoma  . Coronary artery disease Father   . Heart disease Father     CHF    History   Social History  . Marital Status: Married    Spouse Name: N/A    Number of Children: 2  . Years of Education: N/A   Occupational History  . 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 Smoking status: Former Games developer  . Smokeless tobacco: Never Used     Comment: Quit in his 20's.  . Alcohol Use: No  . Drug Use: Not on file  . Sexual Activity: Not on file   Other Topics Concern  . Not on file   Social History Narrative  . No narrative on file   Review of Systems  Constitutional: Negative for fatigue and unexpected weight change.       Wears seat belt  HENT: Positive for tinnitus. Negative for congestion, dental problem, hearing loss and rhinorrhea.        Regular with dentist  Respiratory: Negative for cough, chest tightness and shortness of breath.   Cardiovascular: Negative for chest pain, palpitations and leg swelling.  Gastrointestinal: Negative for nausea, vomiting, abdominal pain, constipation and blood in stool.       No heartburn  Endocrine: Negative for cold intolerance and heat intolerance.  Genitourinary: Positive for difficulty urinating.       Stream may be slow in the morning No sexual problems  Musculoskeletal: Positive for arthralgias. Negative for back pain and joint swelling.       Pain in hands and fingers-- will use 2 advil if really bad  Skin: Positive for rash.       Rash in fall--topicort helps (various spots)  Allergic/Immunologic: Negative for environmental allergies and immunocompromised state.  Neurological: Negative for dizziness, syncope, weakness, light-headedness, numbness and headaches.  Hematological: Negative for adenopathy. Does not bruise/bleed easily.  Psychiatric/Behavioral: Negative for sleep disturbance and dysphoric mood. The patient is not nervous/anxious.        Objective:   Physical Exam  Constitutional: He is oriented to person, place, and time. He appears well-developed and well-nourished. No distress.  HENT:  Head: Normocephalic and atraumatic.  Right Ear: External ear normal.  Left Ear: External ear normal.  Mouth/Throat: Oropharynx is clear and moist. No oropharyngeal exudate.  Eyes:  Conjunctivae and EOM are normal. Pupils are equal, round, and reactive to light.  Neck: Normal range of motion. Neck supple. No thyromegaly present.  Cardiovascular: Normal rate, regular rhythm, normal heart sounds and intact distal pulses.  Exam reveals no gallop.   No murmur heard. Pulmonary/Chest: Effort normal and breath sounds normal. No respiratory distress. He has no wheezes. He has no rales.  Abdominal: Soft. There is no tenderness.  Musculoskeletal: He exhibits no edema and no tenderness.  Lymphadenopathy:    He has no cervical adenopathy.  Neurological: He is alert and oriented to person, place, and time.  Skin: No rash noted. No erythema.  Psychiatric: He has a normal mood and affect. His behavior is normal.          Assessment & Plan:

## 2013-06-11 NOTE — Assessment & Plan Note (Signed)
Healthy but needs to work on fitness Flu vaccine today Check PSA

## 2013-06-11 NOTE — Assessment & Plan Note (Signed)
BP Readings from Last 3 Encounters:  06/11/13 140/90  02/07/13 100/60  02/01/13 140/80   Good control No changes

## 2013-06-11 NOTE — Patient Instructions (Signed)
DASH Diet  The DASH diet stands for "Dietary Approaches to Stop Hypertension." It is a healthy eating plan that has been shown to reduce high blood pressure (hypertension) in as little as 14 days, while also possibly providing other significant health benefits. These other health benefits include reducing the risk of breast cancer after menopause and reducing the risk of type 2 diabetes, heart disease, colon cancer, and stroke. Health benefits also include weight loss and slowing kidney failure in patients with chronic kidney disease.   DIET GUIDELINES  · Limit salt (sodium). Your diet should contain less than 1500 mg of sodium daily.  · Limit refined or processed carbohydrates. Your diet should include mostly whole grains. Desserts and added sugars should be used sparingly.  · Include small amounts of heart-healthy fats. These types of fats include nuts, oils, and tub margarine. Limit saturated and trans fats. These fats have been shown to be harmful in the body.  CHOOSING FOODS   The following food groups are based on a 2000 calorie diet. See your Registered Dietitian for individual calorie needs.  Grains and Grain Products (6 to 8 servings daily)  · Eat More Often: Whole-wheat bread, brown rice, whole-grain or wheat pasta, quinoa, popcorn without added fat or salt (air popped).  · Eat Less Often: White bread, white pasta, white rice, cornbread.  Vegetables (4 to 5 servings daily)  · Eat More Often: Fresh, frozen, and canned vegetables. Vegetables may be raw, steamed, roasted, or grilled with a minimal amount of fat.  · Eat Less Often/Avoid: Creamed or fried vegetables. Vegetables in a cheese sauce.  Fruit (4 to 5 servings daily)  · Eat More Often: All fresh, canned (in natural juice), or frozen fruits. Dried fruits without added sugar. One hundred percent fruit juice (½ cup [237 mL] daily).  · Eat Less Often: Dried fruits with added sugar. Canned fruit in light or heavy syrup.  Lean Meats, Fish, and Poultry (2  servings or less daily. One serving is 3 to 4 oz [85-114 g]).  · Eat More Often: Ninety percent or leaner ground beef, tenderloin, sirloin. Round cuts of beef, chicken breast, turkey breast. All fish. Grill, bake, or broil your meat. Nothing should be fried.  · Eat Less Often/Avoid: Fatty cuts of meat, turkey, or chicken leg, thigh, or wing. Fried cuts of meat or fish.  Dairy (2 to 3 servings)  · Eat More Often: Low-fat or fat-free milk, low-fat plain or light yogurt, reduced-fat or part-skim cheese.  · Eat Less Often/Avoid: Milk (whole, 2%). Whole milk yogurt. Full-fat cheeses.  Nuts, Seeds, and Legumes (4 to 5 servings per week)  · Eat More Often: All without added salt.  · Eat Less Often/Avoid: Salted nuts and seeds, canned beans with added salt.  Fats and Sweets (limited)  · Eat More Often: Vegetable oils, tub margarines without trans fats, sugar-free gelatin. Mayonnaise and salad dressings.  · Eat Less Often/Avoid: Coconut oils, palm oils, butter, stick margarine, cream, half and half, cookies, candy, pie.  FOR MORE INFORMATION  The Dash Diet Eating Plan: www.dashdiet.org  Document Released: 06/24/2011 Document Revised: 09/27/2011 Document Reviewed: 06/24/2011  Exit[employer]® Patient Information ©2014 [employer].

## 2013-06-11 NOTE — Assessment & Plan Note (Signed)
No problems with statin Will check labs 

## 2013-06-28 ENCOUNTER — Ambulatory Visit (INDEPENDENT_AMBULATORY_CARE_PROVIDER_SITE_OTHER): Payer: BC Managed Care – PPO | Admitting: Internal Medicine

## 2013-06-28 ENCOUNTER — Encounter: Payer: Self-pay | Admitting: Internal Medicine

## 2013-06-28 VITALS — BP 162/94 | HR 67 | Temp 96.9°F | Ht 74.5 in | Wt 246.2 lb

## 2013-06-28 DIAGNOSIS — J019 Acute sinusitis, unspecified: Secondary | ICD-10-CM | POA: Insufficient documentation

## 2013-06-28 MED ORDER — AMOXICILLIN 500 MG PO TABS: 1000.0000 mg | ORAL_TABLET | Freq: Two times a day (BID) | ORAL | Status: DC

## 2013-06-28 NOTE — Progress Notes (Signed)
Subjective:    Patient ID: Sean Garcia, male    DOB: 12-27-1950, 62 y.o.   MRN: [id number]  HPI Seemed to get sick a couple of weeks after physical and flu shot Having pressure in head Sinus congestion--draining out mostly (only some PND) Lots of cough---slight mucus Slight ear pain Some sore throat  No fever No sweats or chills but has felt cold No SOB  Using humidifier Using coricidin-- may help some  Current Outpatient Prescriptions on File Prior to Visit  Medication Sig Dispense Refill  . atorvastatin (LIPITOR) 20 MG tablet TAKE 1 TABLET BY MOUTH EVERY DAY  30 tablet  11  . bisoprolol (ZEBETA) 10 MG tablet TAKE 1 TABLET BY MOUTH EVERY DAY  30 tablet  0  . COLCRYS 0.6 MG tablet TAKE 1 TABLET BY MOUTH EVERY DAY  30 tablet  0  . desoximetasone (TOPICORT) 0.25 % cream APPLY TO RASH TWICE DAILY AS NEEDED  60 g  1  . lisinopril (PRINIVIL,ZESTRIL) 20 MG tablet Take 1 tablet (20 mg total) by mouth daily.  90 tablet  3  . phentermine 37.5 MG capsule Take 1 capsule (37.5 mg total) by mouth every morning.  30 capsule  5  . vardenafil (LEVITRA) 20 MG tablet Take 20 mg by mouth daily as needed.       No current facility-administered medications on file prior to visit.    Allergies  Allergen Reactions  . Codeine     Past Medical History  Diagnosis Date  . Hyperlipidemia   . Hypertension   . Osteoarthritis   . ED (erectile dysfunction)   . Obesity     Past Surgical History  Procedure Laterality Date  . Knee surgery      for bowlegs, age 64  . Total knee arthroplasty  2007    bilateral  . Patellar tendon repair  09/2008    left, Dr. Massie Maroon in [place]  . 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 Hyperlipidemia Mother   . Cancer Mother     lymphoma  . Coronary artery disease Father   . Heart disease Father     CHF    History   Social History  . Marital Status: Married    Spouse Name: N/A    Number of  Children: 2  . Years of Education: N/A   Occupational History  . 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 Smoking status: Former Games developer  . Smokeless tobacco: Never Used     Comment: Quit in his 20's.  . Alcohol Use: No  . Drug Use: Not on file  . Sexual Activity: Not on file   Other Topics Concern  . Not on file   Social History Narrative  . No narrative on file   Review of Systems No rash  No vomiting or diarrhea Appetite is okay     Objective:   Physical Exam  Constitutional: He appears well-developed and well-nourished. No distress.  HENT:  No sinus tenderness TMs normal Mild pharyngeal injection Moderate nasal inflammation  Neck: Normal range of motion. Neck supple.  Pulmonary/Chest: Effort normal and breath sounds normal. No respiratory distress. He has no wheezes. He has no rales.  Lymphadenopathy:    He has no cervical adenopathy.  Neurological: A cranial nerve deficit is present.  Skin: No rash noted.  Assessment & Plan:

## 2013-06-28 NOTE — Assessment & Plan Note (Signed)
Has been sick for over 2 weeks Likely secondary bacterial infection Discussed supportive care amoxicillin

## 2013-06-28 NOTE — Patient Instructions (Signed)

## 2013-06-28 NOTE — Progress Notes (Signed)
Pre-visit discussion using our clinic review tool. No additional management support is needed unless otherwise documented below in the visit note.  

## 2013-07-01 ENCOUNTER — Other Ambulatory Visit: Payer: Self-pay | Admitting: Internal Medicine

## 2013-07-02 ENCOUNTER — Telehealth: Payer: Self-pay | Admitting: *Deleted

## 2013-07-02 MED ORDER — AMOXICILLIN-POT CLAVULANATE 875-125 MG PO TABS: 1.0000 | ORAL_TABLET | Freq: Two times a day (BID) | ORAL | Status: DC

## 2013-07-02 NOTE — Telephone Encounter (Signed)
Pt was in for acute sinusitis and states the amoxicillin you prescribed is not working and would like something stronger, still having drainage out his eye and down his throat. Please advise

## 2013-07-02 NOTE — Telephone Encounter (Signed)
Okay to send order for augmentin 875mg  bid #20 x 0

## 2013-07-02 NOTE — Telephone Encounter (Signed)
rx sent to pharmacy by e-script Spoke with patient and advised results   

## 2013-08-20 ENCOUNTER — Other Ambulatory Visit: Payer: Self-pay | Admitting: *Deleted

## 2013-08-20 NOTE — Telephone Encounter (Signed)
Ok to fill? LETVAK PATIENT, Please send back to me for call in

## 2013-08-21 MED ORDER — FLUOCINONIDE 0.05 % EX SOLN: 1.0000 "application " | Freq: Two times a day (BID) | CUTANEOUS | Status: DC

## 2013-08-21 NOTE — Telephone Encounter (Signed)
Please phone in Lidex

## 2013-08-21 NOTE — Telephone Encounter (Signed)
rx sent to pharmacy by e-script  

## 2013-08-30 ENCOUNTER — Other Ambulatory Visit: Payer: Self-pay | Admitting: Internal Medicine

## 2013-12-07 ENCOUNTER — Other Ambulatory Visit: Payer: Self-pay | Admitting: Internal Medicine

## 2013-12-07 NOTE — Telephone Encounter (Signed)
Pt requesting medication refill.Last ov 06/2014-CPE with no future appts scheduled. pls advise

## 2013-12-07 NOTE — Telephone Encounter (Signed)
Okay #30 x 5 

## 2013-12-11 NOTE — Telephone Encounter (Signed)
rx called into pharmacy

## 2014-01-08 ENCOUNTER — Encounter: Payer: Self-pay | Admitting: Family Medicine

## 2014-01-08 ENCOUNTER — Ambulatory Visit (INDEPENDENT_AMBULATORY_CARE_PROVIDER_SITE_OTHER): Payer: BC Managed Care – PPO | Admitting: Family Medicine

## 2014-01-08 VITALS — BP 162/96 | HR 80 | Temp 98.2°F | Wt 256.2 lb

## 2014-01-08 DIAGNOSIS — R229 Localized swelling, mass and lump, unspecified: Secondary | ICD-10-CM | POA: Insufficient documentation

## 2014-01-08 NOTE — Assessment & Plan Note (Addendum)
Localized to R inguinal area. ?localized LN vs skin cyst. No other systemic or localizing sxs. Will monitor for now, rec warm compresses. If not improving as expected over next 2-3 wks, recommend update [place] for ultrasound and labwork in fmhx lymphoma (CBC, LDH). If persistent, will likely refer to surgeon for excisional biopsy. Discussed plan with patient.

## 2014-01-08 NOTE — Progress Notes (Addendum)
BP 162/96  Pulse 80  Temp(Src) 98.2 F (36.8 C) (Oral)  Wt 256 lb 4 oz (116.234 kg)   CC: ?hernia  Subjective:    Patient ID: Sean Garcia, male    DOB: Dec 24, 1950, 63 y.o.   MRN: [id number]  HPI: Sean Garcia is a 63 y.o. male presenting on 01/08/2014 for Hernia   bp elevated today - pt states he has not taking bp meds yet this morning.  Sunday morning while taking shower, felt lump in groin, nontender to palpation. First time he's felt something like this. Father with h/o hernia at pt's age. Pt stays on his feet at work, but no heavy lifting of straining. He is a Theme park manager. (10lb weight gain in last 6 months). No groin pain. No urinary changes. Denies night sweats, fatigue.  Wife encouraged him to come in today.  Mother recently dx with follicular lymphoma, doing well. H/o chronically inflamed lymph node R inguinal region (from infection 25 yrs ago). H/o patches of itchy skin. Come and goes, rotates sites. Tends to get on thumb. Currently with patch on L shoulder. Also has had patch at small of back near coccyx. Has itched to bleeding in past.  COLONOSCOPY Date: 2009 benign polyp, rpt 10 yrs [facility] Plan)  Wt Readings from Last 3 Encounters:  01/08/14 256 lb 4 oz (116.234 kg)  06/28/13 246 lb 4 oz (111.698 kg)  06/11/13 246 lb (111.585 kg)  Body mass index is 32.47 kg/(m^2).  Relevant past medical, surgical, family and social history reviewed and updated as indicated.  Allergies and medications reviewed and updated. Current Outpatient Prescriptions on File Prior to Visit  Medication Sig  . atorvastatin (LIPITOR) 20 MG tablet TAKE 1 TABLET BY MOUTH EVERY DAY  . bisoprolol (ZEBETA) 10 MG tablet TAKE 1 TABLET BY MOUTH EVERY DAY  . COLCRYS 0.6 MG tablet TAKE 1 TABLET BY MOUTH EVERY DAY  . desoximetasone (TOPICORT) 0.25 % cream APPLY TO RASH TWICE DAILY AS NEEDED  . fluocinonide (LIDEX) 0.05 % external solution Apply 1 application topically 2 (two) times daily.  Marland Kitchen lisinopril  (PRINIVIL,ZESTRIL) 20 MG tablet TAKE 1 TABLET BY MOUTH EVERY DAY  . phentermine (ADIPEX-P) 37.5 MG tablet TAKE 1 TABLET BY MOUTH EVERY MORNING  . vardenafil (LEVITRA) 20 MG tablet Take 20 mg by mouth daily as needed.   No current facility-administered medications on file prior to visit.    Review of Systems Per HPI unless specifically indicated above    Objective:    BP 162/96  Pulse 80  Temp(Src) 98.2 F (36.8 C) (Oral)  Wt 256 lb 4 oz (116.234 kg)  Physical Exam  Nursing note and vitals reviewed. Constitutional: He appears well-developed and well-nourished. No distress.  HENT:  Mouth/Throat: Oropharynx is clear and moist. No oropharyngeal exudate.  Abdominal: Soft. Normal appearance and bowel sounds are normal. He exhibits no distension and no mass. There is no hepatosplenomegaly. There is no tenderness. There is no rigidity, no rebound, no guarding, no CVA tenderness and negative Murphy's sign. No hernia. Hernia confirmed negative in the ventral area, confirmed negative in the right inguinal area and confirmed negative in the left inguinal area.  Genitourinary: Testes normal and penis normal. Right testis shows no mass, no swelling and no tenderness. Right testis is descended. Left testis shows no mass, no swelling and no tenderness. Left testis is descended.  Small abrasions from excoriation R anterior hip crease  Lymphadenopathy:       Head (right side): No submental,  no submandibular, no tonsillar, no preauricular and no posterior auricular adenopathy present.       Head (left side): No submental, no submandibular, no tonsillar, no preauricular and no posterior auricular adenopathy present.    He has no cervical adenopathy.    He has no axillary adenopathy.       Right axillary: No pectoral adenopathy present.       Left axillary: No pectoral adenopathy present.      Right: Inguinal (?) adenopathy present. No supraclavicular adenopathy present.       Left: No inguinal and no  supraclavicular adenopathy present.  ~1.5cm firm slightly mobile indurated nodule R inguinal region       Assessment & Plan:   Problem List Items Addressed This Visit   Localized skin mass, lump, or swelling - Primary     Localized to R inguinal area. ?localized LN vs skin cyst. No other systemic or localizing sxs. Will monitor for now, rec warm compresses. If not improving as expected over next 2-3 wks, recommend update [place] for ultrasound and labwork in fmhx lymphoma (CBC, LDH). If persistent, will likely refer to surgeon for excisional biopsy. Discussed plan with patient.        Follow up plan: Return if symptoms worsen or fail to improve.

## 2014-01-08 NOTE — Progress Notes (Signed)
Pre visit review using our clinic review tool, if applicable. No additional management support is needed unless otherwise documented below in the visit note. 

## 2014-01-08 NOTE — Patient Instructions (Signed)
I think this is a cyst - and suggest giving this more time. May use warm compresses to area to help it resolve. If persistent past next few weeks, let me know for ultrasound and blood work. Let me know sooner if worsening pain or enlarging for sooner intervention.

## 2014-03-20 ENCOUNTER — Encounter: Payer: Self-pay | Admitting: Gastroenterology

## 2014-03-24 ENCOUNTER — Other Ambulatory Visit: Payer: Self-pay | Admitting: Internal Medicine

## 2014-04-20 ENCOUNTER — Other Ambulatory Visit: Payer: Self-pay | Admitting: Internal Medicine

## 2014-04-22 ENCOUNTER — Other Ambulatory Visit: Payer: Self-pay | Admitting: Internal Medicine

## 2014-04-22 NOTE — Telephone Encounter (Signed)
Electronic Rx request for Garamycin ointment for nose. Medication is not on patient current medication list. Tried contacting patient for more information, no answer. Please advise.

## 2014-04-22 NOTE — Telephone Encounter (Signed)
Okay #15 gm x 0

## 2014-04-23 NOTE — Telephone Encounter (Signed)
Rx refilled.

## 2014-05-18 ENCOUNTER — Other Ambulatory Visit: Payer: Self-pay | Admitting: Internal Medicine

## 2014-05-28 ENCOUNTER — Other Ambulatory Visit: Payer: Self-pay

## 2014-05-28 ENCOUNTER — Other Ambulatory Visit: Payer: Self-pay | Admitting: Internal Medicine

## 2014-05-28 NOTE — Telephone Encounter (Signed)
Pt left v/m; pt request refill desoximetasone 0.25 % cream to [facility] [address].Please advise.

## 2014-05-29 MED ORDER — DESOXIMETASONE 0.25 % EX CREA: TOPICAL_CREAM | CUTANEOUS | Status: DC

## 2014-05-29 NOTE — Telephone Encounter (Signed)
rx sent to pharmacy by e-script  

## 2014-05-29 NOTE — Telephone Encounter (Signed)
Okay #60gm x 1

## 2014-06-03 ENCOUNTER — Other Ambulatory Visit: Payer: Self-pay | Admitting: Internal Medicine

## 2014-06-24 ENCOUNTER — Other Ambulatory Visit: Payer: Self-pay | Admitting: Internal Medicine

## 2014-07-12 ENCOUNTER — Other Ambulatory Visit: Payer: Self-pay | Admitting: Internal Medicine

## 2014-08-23 ENCOUNTER — Other Ambulatory Visit: Payer: Self-pay | Admitting: Internal Medicine

## 2014-08-23 ENCOUNTER — Other Ambulatory Visit: Payer: Self-pay

## 2014-08-23 MED ORDER — ATORVASTATIN CALCIUM 20 MG PO TABS: 20.0000 mg | ORAL_TABLET | Freq: Every day | ORAL | Status: DC

## 2014-08-23 MED ORDER — PHENTERMINE HCL 37.5 MG PO TABS: 37.5000 mg | ORAL_TABLET | Freq: Every morning | ORAL | Status: DC

## 2014-08-23 NOTE — Telephone Encounter (Signed)
Approved:  okay phentermine #30 x 0 Atorvastatin should be for a year

## 2014-08-23 NOTE — Telephone Encounter (Signed)
Pt left v/m requesting refill atorvastatin and phentermine to [facility] [address] until f/u appt on 09/19/14 with Dr Silvio Pate.Please advise.

## 2014-08-23 NOTE — Telephone Encounter (Signed)
Rx called in to pharmacy. 

## 2014-09-13 ENCOUNTER — Other Ambulatory Visit: Payer: Self-pay | Admitting: Internal Medicine

## 2014-09-19 ENCOUNTER — Ambulatory Visit (INDEPENDENT_AMBULATORY_CARE_PROVIDER_SITE_OTHER): Payer: BLUE CROSS/BLUE SHIELD | Admitting: Internal Medicine

## 2014-09-19 ENCOUNTER — Telehealth: Payer: Self-pay | Admitting: Internal Medicine

## 2014-09-19 ENCOUNTER — Encounter: Payer: Self-pay | Admitting: Internal Medicine

## 2014-09-19 VITALS — BP 144/98 | HR 74 | Temp 98.4°F | Wt 253.8 lb

## 2014-09-19 DIAGNOSIS — M1000 Idiopathic gout, unspecified site: Secondary | ICD-10-CM

## 2014-09-19 DIAGNOSIS — E669 Obesity, unspecified: Secondary | ICD-10-CM

## 2014-09-19 DIAGNOSIS — I10 Essential (primary) hypertension: Secondary | ICD-10-CM | POA: Diagnosis not present

## 2014-09-19 DIAGNOSIS — E785 Hyperlipidemia, unspecified: Secondary | ICD-10-CM

## 2014-09-19 MED ORDER — DESOXIMETASONE 0.25 % EX CREA: TOPICAL_CREAM | CUTANEOUS | Status: DC

## 2014-09-19 MED ORDER — VARDENAFIL HCL 20 MG PO TABS: 20.0000 mg | ORAL_TABLET | Freq: Every day | ORAL | Status: DC | PRN

## 2014-09-19 MED ORDER — PHENTERMINE HCL 37.5 MG PO TABS: 37.5000 mg | ORAL_TABLET | Freq: Every morning | ORAL | Status: DC

## 2014-09-19 MED ORDER — LISINOPRIL 40 MG PO TABS: 40.0000 mg | ORAL_TABLET | Freq: Every day | ORAL | Status: DC

## 2014-09-19 MED ORDER — ZOSTER VACCINE LIVE 19400 UNT/0.65ML ~~LOC~~ SOLR: 0.6500 mL | Freq: Once | SUBCUTANEOUS | Status: DC

## 2014-09-19 NOTE — Assessment & Plan Note (Signed)
He is still fine with primary prevention with statin Due for labs

## 2014-09-19 NOTE — Assessment & Plan Note (Signed)
He feels the phentermine helps  Will continue

## 2014-09-19 NOTE — Progress Notes (Signed)
Pre visit review using our clinic review tool, if applicable. No additional management support is needed unless otherwise documented below in the visit note. 

## 2014-09-19 NOTE — Telephone Encounter (Signed)
emmi emailed °

## 2014-09-19 NOTE — Assessment & Plan Note (Signed)
BP Readings from Last 3 Encounters:  09/19/14 144/98  01/08/14 162/96  06/28/13 162/94   Trying to do right things with lifestyle Not optimally controlled Will increase lisinopril dose

## 2014-09-19 NOTE — Assessment & Plan Note (Signed)
Rare exacerbation Should not be an issue with the statin and colchicine

## 2014-09-19 NOTE — Progress Notes (Signed)
Subjective:    Patient ID: Sean Garcia, male    DOB: 1951/03/26, 64 y.o.   MRN: [id number]  HPI Here for follow up of HTN and other medical issues   He is doing well --he thinks Same job  Wants to continue the phentermine Ran out for a while and weight went up a bit--back down again Tries to exercise regularly  No chest pain No dizziness or syncope No headaches for the most part No edema No SOB  Continues on the atorvastatin No myalgias No GI issues  Will have gout flare rarely Might only be twice a year Colchicine still works well  Current Outpatient Prescriptions on File Prior to Visit  Medication Sig Dispense Refill  . atorvastatin (LIPITOR) 20 MG tablet TAKE 1 TABLET BY MOUTH EVERY DAY 30 tablet 11  . bisoprolol (ZEBETA) 10 MG tablet TAKE 1 TABLET BY MOUTH EVERY DAY 90 tablet 0  . COLCRYS 0.6 MG tablet TAKE 1 TABLET BY MOUTH EVERY DAY 30 tablet 0  . fluocinonide (LIDEX) 0.05 % external solution Apply 1 application topically 2 (two) times daily. 60 mL 0  . gentamicin ointment (GARAMYCIN) 0.1 % APPLY TO NOSE THREE TIMES DAILY FOR 7 DAYS 15 g 0  . lisinopril (PRINIVIL,ZESTRIL) 20 MG tablet TAKE 1 TABLET BY MOUTH EVERY DAY 90 tablet 3   No current facility-administered medications on file prior to visit.    Allergies  Allergen Reactions  . Codeine     Past Medical History  Diagnosis Date  . Hyperlipidemia   . Hypertension   . Osteoarthritis   . ED (erectile dysfunction)   . Obesity     Past Surgical History  Procedure Laterality Date  . Knee surgery      for bowlegs, age 43  . Total knee arthroplasty  2007    bilateral  . Patellar tendon repair  09/2008    left, Dr. Leilani Merl in [place]  . Total shoulder replacement  05/2009    left   . Colonoscopy  2009    benign polyp, rpt 10 yrs [facility] Plan)    Family History  Problem Relation Age of Onset  . Hyperlipidemia Mother   . Cancer Mother     lymphoma  . Coronary artery disease Father   . Heart  disease Father     CHF    History   Social History  . Marital Status: Married    Spouse Name: N/A  . Number of Children: 2  . Years of Education: N/A   Occupational History  . 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 Smoking status: Former Research scientist (life sciences)  . Smokeless tobacco: Never Used     Comment: Quit in his 20's.  . Alcohol Use: No  . Drug Use: Not on file  . Sexual Activity: Not on file   Other Topics Concern  . Not on file   Social History Narrative   Review of Systems The lump he had went away with warm compresses Mom continues to do well with her lymphoma treatment Sinus issue about 2 weeks ago---did get better on its own Still satisfied with levitra Sleeps okay--up once to void. No daytime urinary problems    Objective:   Physical Exam  Constitutional: He appears well-developed and well-nourished. No distress.  Neck: Normal range of motion. Neck supple. No thyromegaly present.  Cardiovascular: Normal rate, regular rhythm, normal heart sounds and intact distal pulses.  Exam reveals no gallop.  No murmur heard. Pulmonary/Chest: Effort normal and breath sounds normal. No respiratory distress. He has no wheezes. He has no rales.  Musculoskeletal: He exhibits no edema or tenderness.  Lymphadenopathy:    He has no cervical adenopathy.  Skin: No rash noted.  Psychiatric: He has a normal mood and affect. His behavior is normal.          Assessment & Plan:

## 2014-09-20 LAB — COMPREHENSIVE METABOLIC PANEL
ALBUMIN: 4.1 g/dL (ref 3.5–5.2)
ALK PHOS: 102 U/L (ref 39–117)
ALT: 17 U/L (ref 0–53)
AST: 23 U/L (ref 0–37)
BUN: 16 mg/dL (ref 6–23)
CO2: 28 mEq/L (ref 19–32)
CREATININE: 1.04 mg/dL (ref 0.40–1.50)
Calcium: 10.3 mg/dL (ref 8.4–10.5)
Chloride: 104 mEq/L (ref 96–112)
GFR: 77 mL/min (ref >60.00)
GLUCOSE: 92 mg/dL (ref 70–99)
Potassium: 4.8 mEq/L (ref 3.5–5.1)
SODIUM: 137 meq/L (ref 135–145)
TOTAL PROTEIN: 7.3 g/dL (ref 6.0–8.3)
Total Bilirubin: 0.7 mg/dL (ref 0.2–1.2)

## 2014-09-20 LAB — CBC WITH DIFFERENTIAL/PLATELET
BASOS PCT: 0.4 % (ref 0.0–3.0)
Basophils Absolute: 0 10*3/uL (ref 0.0–0.1)
EOS PCT: 2.3 % (ref 0.0–5.0)
Eosinophils Absolute: 0.2 10*3/uL (ref 0.0–0.7)
HEMATOCRIT: 44.5 % (ref 39.0–52.0)
Hemoglobin: 15 g/dL (ref 13.0–17.0)
Lymphocytes Relative: 20.7 % (ref 12.0–46.0)
Lymphs Abs: 1.9 10*3/uL (ref 0.7–4.0)
MCHC: 33.7 g/dL (ref 30.0–36.0)
MCV: 84.9 fl (ref 78.0–100.0)
MONO ABS: 0.7 10*3/uL (ref 0.1–1.0)
Monocytes Relative: 7.2 % (ref 3.0–12.0)
NEUTROS ABS: 6.3 10*3/uL (ref 1.4–7.7)
Neutrophils Relative %: 69.4 % (ref 43.0–77.0)
PLATELETS: 243 10*3/uL (ref 150.0–400.0)
RBC: 5.24 Mil/uL (ref 4.22–5.81)
RDW: 13.5 % (ref 11.5–15.5)
WBC: 9.1 10*3/uL (ref 4.0–10.5)

## 2014-09-20 LAB — LIPID PANEL
CHOLESTEROL: 145 mg/dL (ref 0–200)
HDL: 35.1 mg/dL — ABNORMAL LOW (ref >39.00)
LDL CALC: 93 mg/dL (ref 0–99)
NonHDL: 109.9
Total CHOL/HDL Ratio: 4
Triglycerides: 84 mg/dL (ref 0.0–149.0)
VLDL: 16.8 mg/dL (ref 0.0–40.0)

## 2014-09-20 LAB — T4, FREE: Free T4: 1.02 ng/dL (ref 0.60–1.60)

## 2014-10-29 ENCOUNTER — Other Ambulatory Visit: Payer: Self-pay | Admitting: Internal Medicine

## 2014-11-08 NOTE — Consult Note (Signed)
PATIENT NAME:  Sean Garcia, Sean Garcia MR#:  [id number] DATE OF BIRTH:  [birth year]  DATE OF CONSULTATION:  02/04/2013  REFERRING PHYSICIAN:   CONSULTING PHYSICIAN:  Huey Romans, MD  REASON FOR CONSULTATION: Uncontrolled epistaxis.   HISTORY OF PRESENT ILLNESS: The patient is a 64 year old white male with a history of nosebleed, started approximately 6 days ago with just very minor bleeds at home, on and off for a few days. He ended up coming into the Emergency Room on Thursday night and having a sponge pack placed. As he left the Emergency Room, he had bleeding again. Saw Dr. Pryor Ochoa Friday morning in the office. There was 1 potential posterior bleeding site that was cauterized some, and a sponge pack was placed. He was at home resting and had a blood clot come down the back of his throat, and bleeding started again today. He came back to the Emergency Room, had the sponge removed and Afrin was sprayed in his nose. Bleeding had all stopped. He was watched for a while. A balloon pack was placed in the right nostril, and he was watched again. Doing okay. Starting to get moving around to leave and had some blood coming down the back of his throat again. The total there a couple of teaspoons that stopped spontaneously in a couple of minutes, and he has not had any further bleeding. I was called to evaluate.   PAST MEDICAL HISTORY: Significant for hypertension that has been not well controlled. He has had blood pressure issues for probably 20 years. He is currently on lisinopril and bisoprolol. He also has elevated cholesterol and takes Lipitor. He is on aspirin a day and has been taking that all week long. He has had left shoulder surgery and bilateral knee surgery as well. He has never had any nosebleed problems in the past.    CURRENT MEDICATIONS: Lisinopril 20 mg, bisoprolol 10 mg, atorvastatin 20 mg.   DRUG ALLERGIES: CODEINE.   REVIEW OF SYSTEMS: He has not complained of any ear problems. His left  nostril has not been a problem at all. Mouth has been okay, although his throat is dry because of mouth breathing. He has not had any chest pains. No shortness of breath. He did not have any GI upset or constipation.   SOCIAL HISTORY: He does not smoke. He does not drink alcohol.   FAMILY HISTORY: Negative.   PHYSICAL EXAMINATION: The patient has a balloon pack in his right nostril. There is no sign of bleeding from his left nostril. There is no fresh bleeding coming from the back of his throat.   I spent over 30 minutes of time in consultation with the patient, going over his history and what happened and how to resolve the problem. Right now, he has not had enough bleeding that would require surgical intervention or a posterior pack. He needs to go home and rest at home. He is sitting in a lounge chair position right now and needs to stay there at home. He has bathroom privileges only, but otherwise, he parks himself there for 48 hours. The wife will continue to take his blood pressure at home, monitor it. He is to stop caffeine right now. He is typically taking three 16 ounce cups of coffee a day and probably having 300 to 500 mg of caffeine which is maybe why he is having trouble controlling his blood pressure. He will be without caffeine for several days to get things settled down but then will limit his  caffeine intake to 16 ounces of coffee a day and have all decaf drinks otherwise. He will increase his free water intake. He will stop his aspirin which he has been taking all along and will leave that off for a couple of weeks to make sure his bleeding is controlled. He will discuss with Dr. Silvio Pate about his blood pressure issues, try to get that under better control. He will see Dr. Pryor Ochoa on Tuesday for re-evaluation and potentially have the balloon removed and reassess his nose to see if there are other obvious bleeding sites. He is on amoxicillin 500 mg t.i.d. and will continue using that just  preventative to make sure there is not a sinus infection that starts. He will avoid any significant chewing and just be liquidy soft foods for now and nothing really hot that would dilate blood vessels in his nose. He knows if he starts bleeding significantly that he will have to come back to the Emergency Room and we will probably need to put in a posterior pack, but if has a small clot or a teaspoon of blood come out and it stops spontaneously that he just needs to continue to rest at home.   ____________________________ Huey Romans, MD phj:gb D: 02/04/2013 19:25:45 ET T: 02/04/2013 22:09:20 ET JOB#: [id number]  cc: Huey Romans, MD, <Dictator> Venia Carbon, MD Jerene Bears, MD Huey Romans MD ELECTRONICALLY SIGNED 02/08/2013 8:39

## 2014-12-13 ENCOUNTER — Encounter: Payer: Self-pay | Admitting: Family Medicine

## 2014-12-13 ENCOUNTER — Ambulatory Visit (INDEPENDENT_AMBULATORY_CARE_PROVIDER_SITE_OTHER): Payer: BLUE CROSS/BLUE SHIELD | Admitting: Family Medicine

## 2014-12-13 VITALS — BP 132/78 | HR 77 | Temp 97.8°F | Wt 257.2 lb

## 2014-12-13 DIAGNOSIS — B372 Candidiasis of skin and nail: Secondary | ICD-10-CM | POA: Diagnosis not present

## 2014-12-13 MED ORDER — NYSTATIN 100000 UNIT/GM EX CREA: 1.0000 "application " | TOPICAL_CREAM | Freq: Two times a day (BID) | CUTANEOUS | Status: DC

## 2014-12-13 NOTE — Assessment & Plan Note (Signed)
Exam and story consistent with candidal intertrigo - discussed with patient. rec nystatin cream bid x 2-4 weeks until resolution, barrier cream to avoid skin breakdown If no improvement, would consider oral antifungal course (diflucan). Pt agrees with plan. Not consistent with inverse psoriasis 2/2 satellite lesions.

## 2014-12-13 NOTE — Patient Instructions (Addendum)
I think you have candidal intertrigo. Treat with nystatin cream and barrier cream over next 2-4 weeks. If no improvement noted, call me for oral antifungal.  Candida Infection A Candida infection (also called yeast, fungus, and Monilia infection) is an overgrowth of yeast that can occur anywhere on the body. A yeast infection commonly occurs in warm, moist body areas. Usually, the infection remains localized but can spread to become a systemic infection. A yeast infection may be a sign of a more severe disease such as diabetes, leukemia, or AIDS. A yeast infection can occur in both men and women. In women, Candida vaginitis is a vaginal infection. It is one of the most common causes of vaginitis. Men usually do not have symptoms or know they have an infection until other problems develop. Men may find out they have a yeast infection because their sex partner has a yeast infection. Uncircumcised men are more likely to get a yeast infection than circumcised men. This is because the uncircumcised glans is not exposed to air and does not remain as dry as that of a circumcised glans. Older adults may develop yeast infections around dentures. CAUSES  Women  Antibiotics.  Steroid medication taken for a long time.  Being overweight (obese).  Diabetes.  Poor immune condition.  Certain serious medical conditions.  Immune suppressive medications for organ transplant patients.  Chemotherapy.  Pregnancy.  Menstruation.  Stress and fatigue.  Intravenous drug use.  Oral contraceptives.  Wearing tight-fitting clothes in the crotch area.  Catching it from a sex partner who has a yeast infection.  Spermicide.  Intravenous, urinary, or other catheters. Men  Catching it from a sex partner who has a yeast infection.  Having oral or anal sex with a person who has the infection.  Spermicide.  Diabetes.  Antibiotics.  Poor immune system.  Medications that suppress the immune  system.  Intravenous drug use.  Intravenous, urinary, or other catheters. SYMPTOMS  Women  Thick, white vaginal discharge.  Vaginal itching.  Redness and swelling in and around the vagina.  Irritation of the lips of the vagina and perineum.  Blisters on the vaginal lips and perineum.  Painful sexual intercourse.  Low blood sugar (hypoglycemia).  Painful urination.  Bladder infections.  Intestinal problems such as constipation, indigestion, bad breath, bloating, increase in gas, diarrhea, or loose stools. Men  Men may develop intestinal problems such as constipation, indigestion, bad breath, bloating, increase in gas, diarrhea, or loose stools.  Dry, cracked skin on the penis with itching or discomfort.  Jock itch.  Dry, flaky skin.  Athlete's foot.  Hypoglycemia. DIAGNOSIS  Women  A history and an exam are performed.  The discharge may be examined under a microscope.  A culture may be taken of the discharge. Men  A history and an exam are performed.  Any discharge from the penis or areas of cracked skin will be looked at under the microscope and cultured.  Stool samples may be cultured. TREATMENT  Women  Vaginal antifungal suppositories and creams.  Medicated creams to decrease irritation and itching on the outside of the vagina.  Warm compresses to the perineal area to decrease swelling and discomfort.  Oral antifungal medications.  Medicated vaginal suppositories or cream for repeated or recurrent infections.  Wash and dry the irritation areas before applying the cream.  Eating yogurt with Lactobacillus may help with prevention and treatment.  Sometimes painting the vagina with gentian violet solution may help if creams and suppositories do not work.  Men  Antifungal creams and oral antifungal medications.  Sometimes treatment must continue for 30 days after the symptoms go away to prevent recurrence. HOME CARE INSTRUCTIONS  Women  Use  cotton underwear and avoid tight-fitting clothing.  Avoid colored, scented toilet paper and deodorant tampons or pads.  Do not douche.  Keep your diabetes under control.  Finish all the prescribed medications.  Keep your skin clean and dry.  Consume milk or yogurt with Lactobacillus-active culture regularly. If you get frequent yeast infections and think that is what the infection is, there are over-the-counter medications that you can get. If the infection does not show healing in 3 days, talk to your caregiver.  Tell your sex partner you have a yeast infection. Your partner may need treatment also, especially if your infection does not clear up or recurs. Men  Keep your skin clean and dry.  Keep your diabetes under control.  Finish all prescribed medications.  Tell your sex partner that you have a yeast infection so he or she can be treated if necessary. SEEK MEDICAL CARE IF:   Your symptoms do not clear up or worsen in one week after treatment.  You have an oral temperature above 102 F (38.9 C).  You have trouble swallowing or eating for a prolonged time.  You develop blisters on and around your vagina.  You develop vaginal bleeding and it is not your menstrual period.  You develop abdominal pain.  You develop intestinal problems as mentioned above.  You get weak or light-headed.  You have painful or increased urination.  You have pain during sexual intercourse. MAKE SURE YOU:   Understand these instructions.  Will watch your condition.  Will get help right away if you are not doing well or get worse. Document Released: 08/12/2004 Document Revised: 11/19/2013 Document Reviewed: 11/24/2009 [facility] Patient Information 2015 [place], [place]. This information is not intended to replace advice given to you by your health care provider. Make sure you discuss any questions you have with your health care provider.

## 2014-12-13 NOTE — Progress Notes (Signed)
BP 132/78 mmHg  Pulse 77  Temp(Src) 97.8 F (36.6 C) (Oral)  Wt 257 lb 4 oz (116.688 kg)  SpO2 97%   CC: rash/itching  Subjective:    Patient ID: Sean Garcia, male    DOB: 1951/06/15, 64 y.o.   MRN: [id number]  HPI: Sean Garcia is a 64 y.o. male presenting on 12/13/2014 for Pruritis and Groin Pain   Saw Dr Allyson Sabal 04/2014 for scalp seborrheic dermatitis and buttock dermatitis prescribed cloderm. Rash worsened since using topical steroid. Derm then changed med to desonide 0.05% cream. No improvement. Yesterday morning very sore.   Endorses severe burning/itching in groin and posteriorly to buttock that has worsened since 04/2014. Pt worried about jock itch.   Using aveeno soap, using desonide cream. Using lotrimin ultra twice daily for last 4 days. Unsure if this is helping. Avoiding underwear for last 3-4 days  No new lotions, detergents, soaps or shampoos.  No new medications.  No new foods.  No oral lesions, fevers/chills, nausea.  Relevant past medical, surgical, family and social history reviewed and updated as indicated. Interim medical history since our last visit reviewed. Allergies and medications reviewed and updated. Current Outpatient Prescriptions on File Prior to Visit  Medication Sig  . atorvastatin (LIPITOR) 20 MG tablet TAKE 1 TABLET BY MOUTH EVERY DAY  . bisoprolol (ZEBETA) 10 MG tablet TAKE 1 TABLET BY MOUTH EVERY DAY  . COLCRYS 0.6 MG tablet TAKE 1 TABLET BY MOUTH EVERY DAY  . desoximetasone (TOPICORT) 0.25 % cream APPLY TO RASH TWICE DAILY AS NEEDED  . fluocinonide (LIDEX) 0.05 % external solution Apply 1 application topically 2 (two) times daily.  Marland Kitchen gentamicin ointment (GARAMYCIN) 0.1 % APPLY TO NOSE THREE TIMES DAILY FOR 7 DAYS  . lisinopril (PRINIVIL,ZESTRIL) 40 MG tablet Take 1 tablet (40 mg total) by mouth daily.  . phentermine (ADIPEX-P) 37.5 MG tablet Take 1 tablet (37.5 mg total) by mouth every morning.  . vardenafil (LEVITRA) 20 MG tablet  Take 1 tablet (20 mg total) by mouth daily as needed.  . zoster vaccine live, PF, (ZOSTAVAX) 80998 UNT/0.65ML injection Inject 19,400 Units into the skin once.   No current facility-administered medications on file prior to visit.    Review of Systems Per HPI unless specifically indicated above     Objective:    BP 132/78 mmHg  Pulse 77  Temp(Src) 97.8 F (36.6 C) (Oral)  Wt 257 lb 4 oz (116.688 kg)  SpO2 97%  Wt Readings from Last 3 Encounters:  12/13/14 257 lb 4 oz (116.688 kg)  09/19/14 253 lb 12.8 oz (115.123 kg)  01/08/14 256 lb 4 oz (116.234 kg)    Physical Exam  Constitutional: He appears well-developed and well-nourished. No distress.  Musculoskeletal: He exhibits no edema.  Skin: Skin is warm and dry. Rash noted. There is erythema.     Erythematous rash inner groin and up through buttock crease with satellite papule/pustures, pruritic, some base of scrotum involvement  Nursing note and vitals reviewed.     Assessment & Plan:   Problem List Items Addressed This Visit    Candidal intertrigo - Primary    Exam and story consistent with candidal intertrigo - discussed with patient. rec nystatin cream bid x 2-4 weeks until resolution, barrier cream to avoid skin breakdown If no improvement, would consider oral antifungal course (diflucan). Pt agrees with plan. Not consistent with inverse psoriasis 2/2 satellite lesions.      Relevant Medications   nystatin cream (MYCOSTATIN)  Follow up plan: Return if symptoms worsen or fail to improve.

## 2014-12-13 NOTE — Progress Notes (Signed)
Pre visit review using our clinic review tool, if applicable. No additional management support is needed unless otherwise documented below in the visit note. 

## 2015-07-24 ENCOUNTER — Other Ambulatory Visit: Payer: Self-pay | Admitting: Internal Medicine

## 2015-08-25 ENCOUNTER — Other Ambulatory Visit: Payer: Self-pay | Admitting: Internal Medicine

## 2015-09-10 ENCOUNTER — Other Ambulatory Visit: Payer: Self-pay | Admitting: Internal Medicine

## 2015-09-10 MED ORDER — COLCHICINE 0.6 MG PO TABS: 0.6000 mg | ORAL_TABLET | Freq: Two times a day (BID) | ORAL | Status: DC

## 2015-09-10 NOTE — Telephone Encounter (Signed)
Ok to fill 

## 2015-09-10 NOTE — Telephone Encounter (Signed)
Spoke with pt, would like refill on gout medication COLCRYS 0.6 MG tablet, last refilled on 02/21/13.  Please refill to [facility] on [address].   Pt is requesting a call back at [phone]

## 2015-09-10 NOTE — Addendum Note (Signed)
Addended by: Despina Hidden on: 09/10/2015 02:35 PM   Modules accepted: Orders

## 2015-09-10 NOTE — Telephone Encounter (Signed)
rx sent to pharmacy by e-script  

## 2015-09-10 NOTE — Addendum Note (Signed)
Addended by: Despina Hidden on: 09/10/2015 12:39 PM   Modules accepted: Orders

## 2015-09-10 NOTE — Telephone Encounter (Signed)
Okay #60 x 2

## 2015-09-15 ENCOUNTER — Other Ambulatory Visit: Payer: Self-pay | Admitting: *Deleted

## 2015-09-15 MED ORDER — COLCHICINE 0.6 MG PO TABS: 0.6000 mg | ORAL_TABLET | Freq: Two times a day (BID) | ORAL | Status: DC

## 2015-09-23 ENCOUNTER — Ambulatory Visit (INDEPENDENT_AMBULATORY_CARE_PROVIDER_SITE_OTHER): Payer: BLUE CROSS/BLUE SHIELD | Admitting: Internal Medicine

## 2015-09-23 ENCOUNTER — Encounter: Payer: Self-pay | Admitting: Internal Medicine

## 2015-09-23 VITALS — BP 134/88 | HR 70 | Temp 98.2°F | Ht 73.5 in | Wt 257.0 lb

## 2015-09-23 DIAGNOSIS — M1000 Idiopathic gout, unspecified site: Secondary | ICD-10-CM

## 2015-09-23 DIAGNOSIS — E785 Hyperlipidemia, unspecified: Secondary | ICD-10-CM

## 2015-09-23 DIAGNOSIS — I10 Essential (primary) hypertension: Secondary | ICD-10-CM

## 2015-09-23 DIAGNOSIS — Z125 Encounter for screening for malignant neoplasm of prostate: Secondary | ICD-10-CM

## 2015-09-23 DIAGNOSIS — Z0000 Encounter for general adult medical examination without abnormal findings: Secondary | ICD-10-CM

## 2015-09-23 LAB — COMPREHENSIVE METABOLIC PANEL
ALT: 20 U/L (ref 0–53)
AST: 22 U/L (ref 0–37)
Albumin: 4.1 g/dL (ref 3.5–5.2)
Alkaline Phosphatase: 100 U/L (ref 39–117)
BUN: 12 mg/dL (ref 6–23)
CO2: 29 mEq/L (ref 19–32)
Calcium: 10.1 mg/dL (ref 8.4–10.5)
Chloride: 105 mEq/L (ref 96–112)
Creatinine, Ser: 1.06 mg/dL (ref 0.40–1.50)
GFR: 75.08 mL/min (ref >60.00)
Glucose, Bld: 82 mg/dL (ref 70–99)
Potassium: 4.4 mEq/L (ref 3.5–5.1)
Sodium: 143 mEq/L (ref 135–145)
Total Bilirubin: 0.5 mg/dL (ref 0.2–1.2)
Total Protein: 6.6 g/dL (ref 6.0–8.3)

## 2015-09-23 LAB — CBC WITH DIFFERENTIAL/PLATELET
Basophils Absolute: 0 10*3/uL (ref 0.0–0.1)
Basophils Relative: 0.6 % (ref 0.0–3.0)
EOS PCT: 3 % (ref 0.0–5.0)
Eosinophils Absolute: 0.2 10*3/uL (ref 0.0–0.7)
HEMATOCRIT: 44.7 % (ref 39.0–52.0)
HEMOGLOBIN: 14.9 g/dL (ref 13.0–17.0)
LYMPHS PCT: 20.7 % (ref 12.0–46.0)
Lymphs Abs: 1.6 10*3/uL (ref 0.7–4.0)
MCHC: 33.2 g/dL (ref 30.0–36.0)
MCV: 85.3 fl (ref 78.0–100.0)
MONOS PCT: 9.5 % (ref 3.0–12.0)
Monocytes Absolute: 0.7 10*3/uL (ref 0.1–1.0)
Neutro Abs: 5.2 10*3/uL (ref 1.4–7.7)
Neutrophils Relative %: 66.2 % (ref 43.0–77.0)
Platelets: 215 10*3/uL (ref 150.0–400.0)
RBC: 5.25 Mil/uL (ref 4.22–5.81)
RDW: 13.6 % (ref 11.5–15.5)
WBC: 7.8 10*3/uL (ref 4.0–10.5)

## 2015-09-23 LAB — LIPID PANEL
CHOL/HDL RATIO: 3
Cholesterol: 108 mg/dL (ref 0–200)
HDL: 34 mg/dL — AB (ref >39.00)
LDL Cholesterol: 50 mg/dL (ref 0–99)
NONHDL: 74.26
Triglycerides: 120 mg/dL (ref 0.0–149.0)
VLDL: 24 mg/dL (ref 0.0–40.0)

## 2015-09-23 LAB — PSA: PSA: 0.33 ng/mL (ref 0.10–4.00)

## 2015-09-23 MED ORDER — PHENTERMINE HCL 37.5 MG PO TABS: 37.5000 mg | ORAL_TABLET | Freq: Every morning | ORAL | Status: DC

## 2015-09-23 MED ORDER — ATORVASTATIN CALCIUM 20 MG PO TABS: 20.0000 mg | ORAL_TABLET | Freq: Every day | ORAL | Status: DC

## 2015-09-23 MED ORDER — ZOSTER VACCINE LIVE 19400 UNT/0.65ML ~~LOC~~ SOLR: 0.6500 mL | Freq: Once | SUBCUTANEOUS | Status: DC

## 2015-09-23 NOTE — Assessment & Plan Note (Signed)
No problems with statin 

## 2015-09-23 NOTE — Assessment & Plan Note (Signed)
BP Readings from Last 3 Encounters:  09/23/15 134/88  12/13/14 132/78  09/19/14 144/98   Good control No change needed

## 2015-09-23 NOTE — Addendum Note (Signed)
Addended by: Emelia Salisbury C on: 09/23/2015 10:24 AM   Modules accepted: Orders

## 2015-09-23 NOTE — Assessment & Plan Note (Signed)
Fairly healthy but needs to work on fitness Will check PSA after discussion Colon due 2019 Will send zostavax again Flu shot in fall

## 2015-09-23 NOTE — Assessment & Plan Note (Signed)
Rarely uses the colchicine

## 2015-09-23 NOTE — Patient Instructions (Signed)
Please get your flu shot in September or October.

## 2015-09-23 NOTE — Progress Notes (Signed)
Pre visit review using our clinic review tool, if applicable. No additional management support is needed unless otherwise documented below in the visit note. 

## 2015-09-23 NOTE — Progress Notes (Signed)
Subjective:    Patient ID: Sean Garcia, male    DOB: 11-04-1950, 65 y.o.   MRN: [id number]  HPI Here for physical  No new issues Dealing with dying mom in [place] (lymphoma on hospice)  Has been trying to cut back on unhealthy food Avoiding sweets 2 meals a day Has exercise bike--- uses that some  Still has occasional gout flares Colchicine is effective in aborting  Current Outpatient Prescriptions on File Prior to Visit  Medication Sig Dispense Refill  . atorvastatin (LIPITOR) 20 MG tablet TAKE 1 TABLET BY MOUTH EVERY DAY 30 tablet 0  . bisoprolol (ZEBETA) 10 MG tablet TAKE 1 TABLET BY MOUTH EVERY DAY 90 tablet 0  . colchicine (COLCRYS) 0.6 MG tablet Take 1 tablet (0.6 mg total) by mouth 2 (two) times daily. 60 tablet 1  . desoximetasone (TOPICORT) 0.25 % cream APPLY TO RASH TWICE DAILY AS NEEDED 60 g 1  . fluocinonide (LIDEX) 0.05 % external solution Apply 1 application topically 2 (two) times daily. 60 mL 0  . gentamicin ointment (GARAMYCIN) 0.1 % APPLY TO NOSE THREE TIMES DAILY FOR 7 DAYS 15 g 0  . lisinopril (PRINIVIL,ZESTRIL) 40 MG tablet TAKE 1 TABLET(40 MG) BY MOUTH DAILY 90 tablet 0  . nystatin cream (MYCOSTATIN) Apply 1 application topically 2 (two) times daily. 60 g 1  . phentermine (ADIPEX-P) 37.5 MG tablet Take 1 tablet (37.5 mg total) by mouth every morning. 30 tablet 5  . vardenafil (LEVITRA) 20 MG tablet Take 1 tablet (20 mg total) by mouth daily as needed. 10 tablet 11  . zoster vaccine live, PF, (ZOSTAVAX) 60454 UNT/0.65ML injection Inject 19,400 Units into the skin once. 1 each 0   No current facility-administered medications on file prior to visit.    Allergies  Allergen Reactions  . Codeine     Past Medical History  Diagnosis Date  . Hyperlipidemia   . Hypertension   . Osteoarthritis   . ED (erectile dysfunction)   . Obesity     Past Surgical History  Procedure Laterality Date  . Knee surgery      for bowlegs, age 75  . Total knee  arthroplasty  2007    bilateral  . Patellar tendon repair  09/2008    left, Dr. Leilani Merl in [place]  . Total shoulder replacement  05/2009    left   . Colonoscopy  2009    benign polyp, rpt 10 yrs Fuller Plan)    Family History  Problem Relation Age of Onset  . Hyperlipidemia Mother   . Cancer Mother     lymphoma  . Coronary artery disease Father   . Heart disease Father     CHF    Social History   Social History  . Marital Status: Married    Spouse Name: N/A  . Number of Children: 2  . Years of Education: N/A   Occupational History  . 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 Smoking status: Former Research scientist (life sciences)  . Smokeless tobacco: Never Used     Comment: Quit in his 20's.  . Alcohol Use: No  . Drug Use: Not on file  . Sexual Activity: Not on file   Other Topics Concern  . Not on file   Social History Narrative   Review of Systems  Constitutional: Negative for fatigue and unexpected weight change.       Wears seat belt  HENT: Negative for hearing loss and  tinnitus.        Keeps up with dentist  Eyes: Negative for visual disturbance.       No diplopia or unilateral vision loss  Respiratory: Negative for cough, chest tightness and shortness of breath.   Cardiovascular: Negative for chest pain, palpitations and leg swelling.  Gastrointestinal: Negative for nausea, vomiting, abdominal pain, constipation and blood in stool.       No heartburn  Endocrine: Negative for polydipsia and polyuria.  Genitourinary: Negative for urgency and difficulty urinating.       Satisfied with med  Musculoskeletal: Positive for arthralgias. Negative for back pain and joint swelling.       Some right hip if on his feet for many hours  Skin: Negative for rash.       No suspicious lesions--did have lesion on nose treated by dermatologist  Allergic/Immunologic: Negative for environmental allergies and immunocompromised state.  Neurological: Negative for  dizziness, syncope, weakness, light-headedness and headaches.  Psychiatric/Behavioral: Negative for sleep disturbance and dysphoric mood. The patient is not nervous/anxious.        Objective:   Physical Exam  Constitutional: He is oriented to person, place, and time. He appears well-developed and well-nourished. No distress.  HENT:  Head: Normocephalic and atraumatic.  Right Ear: External ear normal.  Left Ear: External ear normal.  Mouth/Throat: Oropharynx is clear and moist. No oropharyngeal exudate.  Eyes: Conjunctivae are normal. Pupils are equal, round, and reactive to light.  Neck: Normal range of motion. Neck supple. No thyromegaly present.  Cardiovascular: Normal rate, regular rhythm, normal heart sounds and intact distal pulses.  Exam reveals no gallop.   No murmur heard. occ skipped beats  Pulmonary/Chest: Effort normal and breath sounds normal. No respiratory distress. He has no wheezes.  Abdominal: Soft. There is no tenderness.  Musculoskeletal: He exhibits no edema or tenderness.  Lymphadenopathy:    He has no cervical adenopathy.  Neurological: He is alert and oriented to person, place, and time.  Skin: No rash noted. No erythema.  Psychiatric: He has a normal mood and affect. His behavior is normal.          Assessment & Plan:

## 2015-11-03 ENCOUNTER — Other Ambulatory Visit: Payer: Self-pay | Admitting: Internal Medicine

## 2015-11-14 ENCOUNTER — Other Ambulatory Visit: Payer: Self-pay | Admitting: Internal Medicine

## 2015-12-02 ENCOUNTER — Other Ambulatory Visit: Payer: Self-pay | Admitting: Internal Medicine

## 2016-04-12 ENCOUNTER — Other Ambulatory Visit: Payer: Self-pay

## 2016-04-12 MED ORDER — PHENTERMINE HCL 37.5 MG PO TABS: 37.5000 mg | ORAL_TABLET | Freq: Every morning | ORAL | 5 refills | Status: DC

## 2016-04-12 NOTE — Telephone Encounter (Signed)
Approved: #30 x 5 

## 2016-04-12 NOTE — Telephone Encounter (Signed)
Pt left v/m requesting refill for phentermine to [facility] [address]. Last refilled # 30 x 5 on 09/23/15. Last annual exam on 09/23/15.pt going out of town 04/15/16 for several days and pt wants to pick up med before leaving town.Please advise.

## 2016-04-12 NOTE — Telephone Encounter (Signed)
Left refill on voice mail at pharmacy  

## 2016-04-22 ENCOUNTER — Other Ambulatory Visit: Payer: Self-pay

## 2016-04-22 NOTE — Telephone Encounter (Signed)
Pt left v/m requesting refill levitra to [facility] [address]. Pt is going out of town this weekend and wants to take rx with him. Last refilled # 10 x 11 on 09/19/14. Last annual 09/23/15.

## 2016-04-23 MED ORDER — VARDENAFIL HCL 20 MG PO TABS: 20.0000 mg | ORAL_TABLET | Freq: Every day | ORAL | 11 refills | Status: DC | PRN

## 2016-04-23 NOTE — Telephone Encounter (Signed)
Approved: okay x 1 year again

## 2016-07-12 ENCOUNTER — Other Ambulatory Visit: Payer: Self-pay | Admitting: Internal Medicine

## 2016-09-16 ENCOUNTER — Other Ambulatory Visit: Payer: Self-pay | Admitting: Internal Medicine

## 2016-10-01 ENCOUNTER — Ambulatory Visit (INDEPENDENT_AMBULATORY_CARE_PROVIDER_SITE_OTHER): Payer: BLUE CROSS/BLUE SHIELD | Admitting: Family Medicine

## 2016-10-01 ENCOUNTER — Encounter: Payer: Self-pay | Admitting: Family Medicine

## 2016-10-01 ENCOUNTER — Ambulatory Visit (INDEPENDENT_AMBULATORY_CARE_PROVIDER_SITE_OTHER): Payer: BLUE CROSS/BLUE SHIELD

## 2016-10-01 DIAGNOSIS — M25512 Pain in left shoulder: Secondary | ICD-10-CM

## 2016-10-01 DIAGNOSIS — M542 Cervicalgia: Secondary | ICD-10-CM | POA: Diagnosis not present

## 2016-10-01 MED ORDER — CYCLOBENZAPRINE HCL 10 MG PO TABS: 10.0000 mg | ORAL_TABLET | Freq: Two times a day (BID) | ORAL | 0 refills | Status: DC | PRN

## 2016-10-01 NOTE — Patient Instructions (Addendum)
You can take 2 alleve twice a day for the next 3-4 days as needed, can also take tylenol  Apply ice off and on tonight, can switch to heat tomorrow  Do gentle ROM exercises of neck, shoulders, elbows and back daily for next several days.   If you develop severe headache or any visual changes, please go to ER

## 2016-10-01 NOTE — Progress Notes (Signed)
Pre visit review using our clinic review tool, if applicable. No additional management support is needed unless otherwise documented below in the visit note. 
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
## 2016-10-13 ENCOUNTER — Telehealth: Payer: Self-pay | Admitting: Internal Medicine

## 2016-10-13 NOTE — Telephone Encounter (Signed)
Patient would like notes from NP [facility] sent to the below office. He requests to note that he will be seeign Dr. Noemi Chapel for a second opinion.  The patient wouls also like his x-ray results.  [facility] Dr. Elsie Saas Fax: [phone]   Thank you,  -LL

## 2016-10-13 NOTE — Telephone Encounter (Signed)
Please redirect to medical records.

## 2016-10-15 ENCOUNTER — Other Ambulatory Visit: Payer: Self-pay | Admitting: Internal Medicine

## 2016-10-18 ENCOUNTER — Telehealth: Payer: Self-pay | Admitting: Family Medicine

## 2016-10-18 MED ORDER — LISINOPRIL 40 MG PO TABS: ORAL_TABLET | ORAL | 0 refills | Status: DC

## 2016-10-18 MED ORDER — PHENTERMINE HCL 37.5 MG PO TABS: 37.5000 mg | ORAL_TABLET | Freq: Every morning | ORAL | 1 refills | Status: DC

## 2016-10-18 NOTE — Telephone Encounter (Signed)
Patient made aware and given medical records department number. Patient verbally expressed understanding.

## 2016-10-18 NOTE — Addendum Note (Signed)
Addended by: Modena Nunnery on: 10/18/2016 09:24 AM   Modules accepted: Orders

## 2016-10-18 NOTE — Addendum Note (Signed)
Addended by: Pilar Grammes on: 10/18/2016 03:32 PM   Modules accepted: Orders

## 2016-10-18 NOTE — Telephone Encounter (Signed)
Phentermine last filled 03/2016 #30 5R. Last OV 09/2015-CPE

## 2016-10-18 NOTE — Telephone Encounter (Signed)
Lisinopril sent erx Phentermine refill on voice mail at pharmacy

## 2016-10-18 NOTE — Telephone Encounter (Signed)
Approved: okay #30 x 1 only He needs to set up for physical

## 2016-10-18 NOTE — Telephone Encounter (Signed)
Patient needs to contact medical records.

## 2016-10-18 NOTE — Telephone Encounter (Signed)
Patient called to request that medical records from his 10/01/16 visit be transferred/faxed to [facility] to Dr. Elsie Saas for a second opinion. Fax records to [phone]. [employer] phone number is [phone]. Please call patient to advise at [phone].

## 2016-10-18 NOTE — Telephone Encounter (Signed)
Pt called to ck status of refill. I sch him for a cpe 10/29/16. He is requesting a cb to confirm it was ordered.

## 2016-10-18 NOTE — Telephone Encounter (Signed)
Spoke to pt

## 2016-10-29 ENCOUNTER — Encounter: Payer: Self-pay | Admitting: Internal Medicine

## 2016-10-29 ENCOUNTER — Ambulatory Visit (INDEPENDENT_AMBULATORY_CARE_PROVIDER_SITE_OTHER): Payer: BLUE CROSS/BLUE SHIELD | Admitting: Internal Medicine

## 2016-10-29 VITALS — BP 134/84 | HR 61 | Temp 98.1°F | Wt 254.0 lb

## 2016-10-29 DIAGNOSIS — Z6831 Body mass index (BMI) 31.0-31.9, adult: Secondary | ICD-10-CM

## 2016-10-29 DIAGNOSIS — E785 Hyperlipidemia, unspecified: Secondary | ICD-10-CM | POA: Diagnosis not present

## 2016-10-29 DIAGNOSIS — Z0000 Encounter for general adult medical examination without abnormal findings: Secondary | ICD-10-CM

## 2016-10-29 DIAGNOSIS — I10 Essential (primary) hypertension: Secondary | ICD-10-CM

## 2016-10-29 DIAGNOSIS — E669 Obesity, unspecified: Secondary | ICD-10-CM | POA: Diagnosis not present

## 2016-10-29 LAB — COMPREHENSIVE METABOLIC PANEL
ALBUMIN: 4.1 g/dL (ref 3.5–5.2)
ALT: 32 U/L (ref 0–53)
AST: 28 U/L (ref 0–37)
Alkaline Phosphatase: 83 U/L (ref 39–117)
BUN: 16 mg/dL (ref 6–23)
CO2: 32 mEq/L (ref 19–32)
Calcium: 10.1 mg/dL (ref 8.4–10.5)
Chloride: 102 mEq/L (ref 96–112)
Creatinine, Ser: 1.01 mg/dL (ref 0.40–1.50)
GFR: 79.1 mL/min (ref >60.00)
Glucose, Bld: 94 mg/dL (ref 70–99)
POTASSIUM: 4.4 meq/L (ref 3.5–5.1)
SODIUM: 138 meq/L (ref 135–145)
Total Bilirubin: 0.7 mg/dL (ref 0.2–1.2)
Total Protein: 7.1 g/dL (ref 6.0–8.3)

## 2016-10-29 LAB — CBC WITH DIFFERENTIAL/PLATELET
BASOS ABS: 0 10*3/uL (ref 0.0–0.1)
Basophils Relative: 0.6 % (ref 0.0–3.0)
EOS PCT: 4.7 % (ref 0.0–5.0)
Eosinophils Absolute: 0.4 10*3/uL (ref 0.0–0.7)
HCT: 46 % (ref 39.0–52.0)
HEMOGLOBIN: 15.3 g/dL (ref 13.0–17.0)
Lymphocytes Relative: 27.6 % (ref 12.0–46.0)
Lymphs Abs: 2.1 10*3/uL (ref 0.7–4.0)
MCHC: 33.2 g/dL (ref 30.0–36.0)
MCV: 86.9 fl (ref 78.0–100.0)
MONO ABS: 0.8 10*3/uL (ref 0.1–1.0)
MONOS PCT: 10.3 % (ref 3.0–12.0)
Neutro Abs: 4.4 10*3/uL (ref 1.4–7.7)
Neutrophils Relative %: 56.8 % (ref 43.0–77.0)
Platelets: 205 10*3/uL (ref 150.0–400.0)
RBC: 5.3 Mil/uL (ref 4.22–5.81)
RDW: 13.9 % (ref 11.5–15.5)
WBC: 7.7 10*3/uL (ref 4.0–10.5)

## 2016-10-29 LAB — LIPID PANEL
CHOLESTEROL: 144 mg/dL (ref 0–200)
HDL: 40.7 mg/dL (ref >39.00)
LDL CALC: 90 mg/dL (ref 0–99)
NonHDL: 103.49
Total CHOL/HDL Ratio: 4
Triglycerides: 68 mg/dL (ref 0.0–149.0)
VLDL: 13.6 mg/dL (ref 0.0–40.0)

## 2016-10-29 MED ORDER — PHENTERMINE HCL 37.5 MG PO TABS: 37.5000 mg | ORAL_TABLET | Freq: Every morning | ORAL | 5 refills | Status: DC

## 2016-10-29 MED ORDER — DESOXIMETASONE 0.25 % EX CREA: TOPICAL_CREAM | CUTANEOUS | 5 refills | Status: DC

## 2016-10-29 MED ORDER — ATORVASTATIN CALCIUM 20 MG PO TABS
ORAL_TABLET | ORAL | 3 refills | Status: DC
Start: 2016-10-29 — End: 2017-11-10

## 2016-10-29 MED ORDER — BISOPROLOL FUMARATE 10 MG PO TABS: 10.0000 mg | ORAL_TABLET | Freq: Every day | ORAL | 3 refills | Status: DC

## 2016-10-29 MED ORDER — LISINOPRIL 40 MG PO TABS: ORAL_TABLET | ORAL | 3 refills | Status: DC

## 2016-10-29 NOTE — Assessment & Plan Note (Signed)
Healthy Does exercise some--but needs to improve with this Colon due next year Defer PSA to at least next year

## 2016-10-29 NOTE — Assessment & Plan Note (Signed)
BP Readings from Last 3 Encounters:  10/29/16 134/84  10/01/16 (!) 162/90  09/23/15 134/88   Good control

## 2016-10-29 NOTE — Assessment & Plan Note (Signed)
Will continue the phentermine--helps him control appetite

## 2016-10-29 NOTE — Assessment & Plan Note (Signed)
No problems with statin for primary prevention 

## 2016-10-29 NOTE — Progress Notes (Signed)
Pre visit review using our clinic review tool, if applicable. No additional management support is needed unless otherwise documented below in the visit note. 
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
## 2016-11-01 ENCOUNTER — Other Ambulatory Visit: Payer: Self-pay | Admitting: Internal Medicine

## 2016-11-03 ENCOUNTER — Other Ambulatory Visit: Payer: Self-pay | Admitting: Dermatology

## 2016-11-05 ENCOUNTER — Encounter: Payer: BLUE CROSS/BLUE SHIELD | Admitting: Internal Medicine

## 2016-11-15 ENCOUNTER — Other Ambulatory Visit: Payer: Self-pay | Admitting: Internal Medicine

## 2016-12-21 ENCOUNTER — Other Ambulatory Visit: Payer: Self-pay | Admitting: Internal Medicine

## 2016-12-21 NOTE — Telephone Encounter (Signed)
Last filled 11-18-16 #30 Last OV 10-29-16 Next OV 11-01-17

## 2016-12-21 NOTE — Telephone Encounter (Signed)
Left refill on voice mail at pharmacy  

## 2016-12-21 NOTE — Telephone Encounter (Signed)
Approved: Okay #30 x 5

## 2017-01-14 ENCOUNTER — Other Ambulatory Visit: Payer: Self-pay | Admitting: Internal Medicine

## 2017-05-22 ENCOUNTER — Other Ambulatory Visit: Payer: Self-pay | Admitting: Internal Medicine

## 2017-05-23 NOTE — Telephone Encounter (Signed)
Last filled 04-21-17 Last OV 10-29-16 Next OV 11-01-17

## 2017-05-23 NOTE — Telephone Encounter (Signed)
Approved:okay #30 x 5

## 2017-06-03 ENCOUNTER — Encounter: Payer: Self-pay | Admitting: Internal Medicine

## 2017-06-03 ENCOUNTER — Ambulatory Visit: Payer: BLUE CROSS/BLUE SHIELD | Admitting: Internal Medicine

## 2017-06-03 VITALS — BP 148/86 | HR 72 | Temp 98.2°F | Wt 262.0 lb

## 2017-06-03 DIAGNOSIS — Z23 Encounter for immunization: Secondary | ICD-10-CM

## 2017-06-03 DIAGNOSIS — I10 Essential (primary) hypertension: Secondary | ICD-10-CM

## 2017-06-03 NOTE — Addendum Note (Signed)
Addended by: Pilar Grammes on: 06/03/2017 11:51 AM   Modules accepted: Orders

## 2017-06-03 NOTE — Patient Instructions (Signed)
DASH Eating Plan DASH stands for "Dietary Approaches to Stop Hypertension." The DASH eating plan is a healthy eating plan that has been shown to reduce high blood pressure (hypertension). It may also reduce your risk for type 2 diabetes, heart disease, and stroke. The DASH eating plan may also help with weight loss. What are tips for following this plan? General guidelines  Avoid eating more than 2,300 mg (milligrams) of salt (sodium) a day. If you have hypertension, you may need to reduce your sodium intake to 1,500 mg a day.  Limit alcohol intake to no more than 1 drink a day for nonpregnant women and 2 drinks a day for men. One drink equals 12 oz of beer, 5 oz of wine, or 1 oz of hard liquor.  Work with your health care provider to maintain a healthy body weight or to lose weight. Ask what an ideal weight is for you.  Get at least 30 minutes of exercise that causes your heart to beat faster (aerobic exercise) most days of the week. Activities may include walking, swimming, or biking.  Work with your health care provider or diet and nutrition specialist (dietitian) to adjust your eating plan to your individual calorie needs. Reading food labels  Check food labels for the amount of sodium per serving. Choose foods with less than 5 percent of the Daily Value of sodium. Generally, foods with less than 300 mg of sodium per serving fit into this eating plan.  To find whole grains, look for the word "whole" as the first word in the ingredient list. Shopping  Buy products labeled as "low-sodium" or "no salt added."  Buy fresh foods. Avoid canned foods and premade or frozen meals. Cooking  Avoid adding salt when cooking. Use salt-free seasonings or herbs instead of table salt or sea salt. Check with your health care provider or pharmacist before using salt substitutes.  Do not fry foods. Cook foods using healthy methods such as baking, boiling, grilling, and broiling instead.  Cook with  heart-healthy oils, such as olive, canola, soybean, or sunflower oil. Meal planning   Eat a balanced diet that includes: ? 5 or more servings of fruits and vegetables each day. At each meal, try to fill half of your plate with fruits and vegetables. ? Up to 6-8 servings of whole grains each day. ? Less than 6 oz of lean meat, poultry, or fish each day. A 3-oz serving of meat is about the same size as a deck of cards. One egg equals 1 oz. ? 2 servings of low-fat dairy each day. ? A serving of nuts, seeds, or beans 5 times each week. ? Heart-healthy fats. Healthy fats called Omega-3 fatty acids are found in foods such as flaxseeds and coldwater fish, like sardines, salmon, and mackerel.  Limit how much you eat of the following: ? Canned or prepackaged foods. ? Food that is high in trans fat, such as fried foods. ? Food that is high in saturated fat, such as fatty meat. ? Sweets, desserts, sugary drinks, and other foods with added sugar. ? Full-fat dairy products.  Do not salt foods before eating.  Try to eat at least 2 vegetarian meals each week.  Eat more home-cooked food and less restaurant, buffet, and fast food.  When eating at a restaurant, ask that your food be prepared with less salt or no salt, if possible. What foods are recommended? The items listed may not be a complete list. Talk with your dietitian about what   dietary choices are best for you. Grains Whole-grain or whole-wheat bread. Whole-grain or whole-wheat pasta. Brown rice. Oatmeal. Quinoa. Bulgur. Whole-grain and low-sodium cereals. Pita bread. Low-fat, low-sodium crackers. Whole-wheat flour tortillas. Vegetables Fresh or frozen vegetables (raw, steamed, roasted, or grilled). Low-sodium or reduced-sodium tomato and vegetable juice. Low-sodium or reduced-sodium tomato sauce and tomato paste. Low-sodium or reduced-sodium canned vegetables. Fruits All fresh, dried, or frozen fruit. Canned fruit in natural juice (without  added sugar). Meat and other protein foods Skinless chicken or turkey. Ground chicken or turkey. Pork with fat trimmed off. Fish and seafood. Egg whites. Dried beans, peas, or lentils. Unsalted nuts, nut butters, and seeds. Unsalted canned beans. Lean cuts of beef with fat trimmed off. Low-sodium, lean deli meat. Dairy Low-fat (1%) or fat-free (skim) milk. Fat-free, low-fat, or reduced-fat cheeses. Nonfat, low-sodium ricotta or cottage cheese. Low-fat or nonfat yogurt. Low-fat, low-sodium cheese. Fats and oils Soft margarine without trans fats. Vegetable oil. Low-fat, reduced-fat, or light mayonnaise and salad dressings (reduced-sodium). Canola, safflower, olive, soybean, and sunflower oils. Avocado. Seasoning and other foods Herbs. Spices. Seasoning mixes without salt. Unsalted popcorn and pretzels. Fat-free sweets. What foods are not recommended? The items listed may not be a complete list. Talk with your dietitian about what dietary choices are best for you. Grains Baked goods made with fat, such as croissants, muffins, or some breads. Dry pasta or rice meal packs. Vegetables Creamed or fried vegetables. Vegetables in a cheese sauce. Regular canned vegetables (not low-sodium or reduced-sodium). Regular canned tomato sauce and paste (not low-sodium or reduced-sodium). Regular tomato and vegetable juice (not low-sodium or reduced-sodium). Pickles. Olives. Fruits Canned fruit in a light or heavy syrup. Fried fruit. Fruit in cream or butter sauce. Meat and other protein foods Fatty cuts of meat. Ribs. Fried meat. Bacon. Sausage. Bologna and other processed lunch meats. Salami. Fatback. Hotdogs. Bratwurst. Salted nuts and seeds. Canned beans with added salt. Canned or smoked fish. Whole eggs or egg yolks. Chicken or turkey with skin. Dairy Whole or 2% milk, cream, and half-and-half. Whole or full-fat cream cheese. Whole-fat or sweetened yogurt. Full-fat cheese. Nondairy creamers. Whipped toppings.  Processed cheese and cheese spreads. Fats and oils Butter. Stick margarine. Lard. Shortening. Ghee. Bacon fat. Tropical oils, such as coconut, palm kernel, or palm oil. Seasoning and other foods Salted popcorn and pretzels. Onion salt, garlic salt, seasoned salt, table salt, and sea salt. Worcestershire sauce. Tartar sauce. Barbecue sauce. Teriyaki sauce. Soy sauce, including reduced-sodium. Steak sauce. Canned and packaged gravies. Fish sauce. Oyster sauce. Cocktail sauce. Horseradish that you find on the shelf. Ketchup. Mustard. Meat flavorings and tenderizers. Bouillon cubes. Hot sauce and Tabasco sauce. Premade or packaged marinades. Premade or packaged taco seasonings. Relishes. Regular salad dressings. Where to find more information:  National Heart, Lung, and Blood Institute: www.nhlbi.nih.gov  American Heart Association: www.heart.org Summary  The DASH eating plan is a healthy eating plan that has been shown to reduce high blood pressure (hypertension). It may also reduce your risk for type 2 diabetes, heart disease, and stroke.  With the DASH eating plan, you should limit salt (sodium) intake to 2,300 mg a day. If you have hypertension, you may need to reduce your sodium intake to 1,500 mg a day.  When on the DASH eating plan, aim to eat more fresh fruits and vegetables, whole grains, lean proteins, low-fat dairy, and heart-healthy fats.  Work with your health care provider or diet and nutrition specialist (dietitian) to adjust your eating plan to your individual   calorie needs. This information is not intended to replace advice given to you by your health care provider. Make sure you discuss any questions you have with your health care provider. Document Released: 06/24/2011 Document Revised: 06/28/2016 Document Reviewed: 06/28/2016 Elsevier Interactive Patient Education  2017 Elsevier Inc.  
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
## 2017-06-03 NOTE — Assessment & Plan Note (Signed)
BP Readings from Last 3 Encounters:  06/03/17 (!) 148/86  10/29/16 134/84  10/01/16 (!) 162/90   Repeat 154/90 on right Not sure his cuff is  Discussed the phentermine---asked him to stop as it may be causing some of his problems He needs a vacation and probably to slow down somewhat

## 2017-06-13 ENCOUNTER — Ambulatory Visit: Payer: Self-pay

## 2017-06-13 NOTE — Telephone Encounter (Signed)
  Reason for Disposition . Systolic BP  >= 038 OR Diastolic >= 333  Answer Assessment - Initial Assessment Questions 1. BLOOD PRESSURE: "What is the blood pressure?" "Did you take at least two measurements 5 minutes apart?"     BP this morning 164/107: yesterday 146/104 2. ONSET: "When did you take your blood pressure?"     This morning 3. HOW: "How did you obtain the blood pressure?" (e.g., visiting nurse, automatic home BP monitor)     Home BP monitor 4. HISTORY: "Do you have a history of high blood pressure?"     Yes 5. MEDICATIONS: "Are you taking any medications for blood pressure?" "Have you missed any doses recently?"     Yes- no missed doses. 6. OTHER SYMPTOMS: "Do you have any symptoms?" (e.g., headache, chest pain, blurred vision, difficulty breathing, weakness)     "A little shortness of breath." Denies any chest pain. 7. PREGNANCY: "Is there any chance you are pregnant?" "When was your last menstrual period?"     No  Protocols used: HIGH BLOOD PRESSURE-A-AH

## 2017-06-14 ENCOUNTER — Encounter: Payer: Self-pay | Admitting: Internal Medicine

## 2017-06-14 ENCOUNTER — Ambulatory Visit: Payer: BLUE CROSS/BLUE SHIELD | Admitting: Internal Medicine

## 2017-06-14 VITALS — BP 138/88 | HR 68 | Temp 98.0°F | Wt 255.5 lb

## 2017-06-14 DIAGNOSIS — I10 Essential (primary) hypertension: Secondary | ICD-10-CM | POA: Diagnosis not present

## 2017-06-14 LAB — RENAL FUNCTION PANEL
ALBUMIN: 4.1 g/dL (ref 3.5–5.2)
BUN: 12 mg/dL (ref 6–23)
CO2: 31 meq/L (ref 19–32)
Calcium: 10.3 mg/dL (ref 8.4–10.5)
Chloride: 104 mEq/L (ref 96–112)
Creatinine, Ser: 1.14 mg/dL (ref 0.40–1.50)
GFR: 68.65 mL/min (ref >60.00)
Glucose, Bld: 104 mg/dL — ABNORMAL HIGH (ref 70–99)
POTASSIUM: 4.6 meq/L (ref 3.5–5.1)
Phosphorus: 2.9 mg/dL (ref 2.3–4.6)
SODIUM: 141 meq/L (ref 135–145)

## 2017-06-14 LAB — T4, FREE: Free T4: 1.09 ng/dL (ref 0.60–1.60)

## 2017-06-14 NOTE — Assessment & Plan Note (Signed)
BP Readings from Last 3 Encounters:  06/14/17 138/88  06/03/17 (!) 148/86  10/29/16 134/84   Better  Has really improved his lifestyle and needs to continue to protect himself at work No changes Check labs due to trigeminy

## 2017-06-14 NOTE — Progress Notes (Signed)
Subjective:    Patient ID: Sean Garcia, male    DOB: 06/08/1951, 66 y.o.   MRN: [id number]  HPI Here for follow up of HTN  Stopped the phentermine Has been much better with lifestyle--eating better, more exercise Has lost 7# Feels much better He did have sense of being "winded" a couple of times---usually in AM and better after having his coffee. Just sense of "not taking a normal breath"  Current Outpatient Medications on File Prior to Visit  Medication Sig Dispense Refill  . atorvastatin (LIPITOR) 20 MG tablet TAKE 1 TABLET(20 MG) BY MOUTH DAILY 90 tablet 3  . bisoprolol (ZEBETA) 10 MG tablet TAKE 1 TABLET BY MOUTH EVERY DAY 90 tablet 3  . colchicine (COLCRYS) 0.6 MG tablet Take 1 tablet (0.6 mg total) by mouth 2 (two) times daily. 60 tablet 1  . cyclobenzaprine (FLEXERIL) 10 MG tablet Take 1 tablet (10 mg total) by mouth 2 (two) times daily as needed for muscle spasms. 14 tablet 0  . desoximetasone (TOPICORT) 0.25 % cream APPLY EXTERNALLY TO THE AFFECTED AREA TWICE DAILY AS NEEDED FOR RASH 60 g 5  . gentamicin ointment (GARAMYCIN) 0.1 % APPLY TO NOSE THREE TIMES DAILY FOR 7 DAYS 15 g 0  . lisinopril (PRINIVIL,ZESTRIL) 40 MG tablet TAKE 1 TABLET(40 MG) BY MOUTH DAILY 90 tablet 3  . nystatin cream (MYCOSTATIN) Apply 1 application topically 2 (two) times daily. 60 g 1  . vardenafil (LEVITRA) 20 MG tablet Take 1 tablet (20 mg total) by mouth daily as needed. 10 tablet 11   No current facility-administered medications on file prior to visit.     Allergies  Allergen Reactions  . Codeine     Past Medical History:  Diagnosis Date  . ED (erectile dysfunction)   . Hyperlipidemia   . Hypertension   . Obesity   . Osteoarthritis     Past Surgical History:  Procedure Laterality Date  . COLONOSCOPY  2009   benign polyp, rpt 10 yrs [facility] Plan)  . KNEE SURGERY     for bowlegs, age 21  . PATELLAR TENDON REPAIR  09/2008   left, Dr. Leilani Merl in [place]  . TOTAL KNEE ARTHROPLASTY  2007    bilateral  . TOTAL SHOULDER REPLACEMENT  05/2009   left     Family History  Problem Relation Age of Onset  . Hyperlipidemia Mother   . Cancer Mother        lymphoma  . Coronary artery disease Father   . Heart disease Father        CHF    Social History   Socioeconomic History  . Marital status: Married    Spouse name: Not on file  . Number of children: 2  . Years of education: Not on file  . Highest education level: Not on file  Social Needs  . Financial resource strain: Not on file  . Food insecurity - worry: Not on file  . Food insecurity - inability: Not on file  . Transportation needs - medical: Not on file  . Transportation needs - non-medical: Not on file  Occupational History  . Occupation: Landscape architect of [employer]    Employer: [employer]  Tobacco Use  . Smoking status: Former Research scientist (life sciences)  . Smokeless tobacco: Never Used  . Tobacco comment: Quit in his 63's.  Substance and Sexual Activity  . Alcohol use: No  . Drug use: Not on file  . Sexual activity: Not on file  Other Topics Concern  . Not on file  Social History Narrative  . Not on file   Review of Systems  Sleeping well Has discussed his need to slow down with some church board members and folks through his denomination     Objective:   Physical Exam  Constitutional: No distress.  Neck: No thyromegaly present.  Cardiovascular: Normal rate and normal heart sounds. Exam reveals no gallop.  No murmur heard. Mostly trigeminy  Pulmonary/Chest: Effort normal and breath sounds normal. No respiratory distress. He has no wheezes. He has no rales.  Lymphadenopathy:    He has no cervical adenopathy.  Psychiatric: He has a normal mood and affect. His behavior is normal.          Assessment & Plan:

## 2017-06-16 ENCOUNTER — Other Ambulatory Visit: Payer: Self-pay | Admitting: Internal Medicine

## 2017-06-17 NOTE — Telephone Encounter (Signed)
Approved: #[id number] x 1 

## 2017-06-17 NOTE — Telephone Encounter (Signed)
Last Rx 08/2015. Last OV 05/2017

## 2017-07-07 ENCOUNTER — Ambulatory Visit: Payer: BLUE CROSS/BLUE SHIELD | Admitting: Internal Medicine

## 2017-09-28 ENCOUNTER — Other Ambulatory Visit: Payer: Self-pay | Admitting: Internal Medicine

## 2017-09-28 ENCOUNTER — Telehealth: Payer: Self-pay

## 2017-09-28 NOTE — Telephone Encounter (Signed)
Patient came by the office and dropped off information he received about his Colcrys from [facility] for Dr Silvio Pate to look at. Patient would like to be notified about the steps he needs to take or changes that will be made about this issue. CB [phone] (M)-Renesmee Raine V Arihant Pennings, RMA (form placed in the [employer] for review)

## 2017-09-29 NOTE — Telephone Encounter (Signed)
Completed PA on CMM. Advised pt what the issue was and it could take up to 14 business days to hear back.

## 2017-10-03 NOTE — Telephone Encounter (Signed)
Received a fax that states the Medication is a Tier 3 and does not require a PA.

## 2017-10-31 ENCOUNTER — Other Ambulatory Visit: Payer: Self-pay | Admitting: Internal Medicine

## 2017-11-01 ENCOUNTER — Encounter: Payer: BLUE CROSS/BLUE SHIELD | Admitting: Internal Medicine

## 2017-11-02 ENCOUNTER — Other Ambulatory Visit: Payer: Self-pay | Admitting: Internal Medicine

## 2017-11-10 ENCOUNTER — Other Ambulatory Visit: Payer: Self-pay | Admitting: Internal Medicine

## 2017-12-07 ENCOUNTER — Other Ambulatory Visit: Payer: Self-pay | Admitting: Internal Medicine

## 2017-12-30 ENCOUNTER — Telehealth: Payer: Self-pay | Admitting: Internal Medicine

## 2017-12-30 NOTE — Telephone Encounter (Signed)
Copied from [place] [id number]. Topic: Quick Communication - Rx Refill/Question >> Dec 30, 2017 10:36 AM Selinda Flavin B, NT wrote: Medication: colchicine 0.6 MG tablet  Has the patient contacted their pharmacy? Yes.   (Agent: If no, request that the patient contact the pharmacy for the refill.) (Agent: If yes, when and what did the pharmacy advise?)  Preferred Pharmacy (with phone number or street name): [id number] - [place], [place]  Agent: Please be advised that RX refills may take up to 3 business days. We ask that you follow-up with your pharmacy.

## 2017-12-30 NOTE — Telephone Encounter (Addendum)
Pharmacy states this med is not covered at all. So will either need a tier exception, or the dr needs to change to something else. Pt has one tab left and was concerned that he had left the form back in March, and this had not been addressed. Please advise on what the dr would like to do.  colchicine 0.6 MG tablet   [facility] [place], [address] [phone] (Phone) [phone] (Fax)   Pt never picked up this Rx on 12/07/17. Pt states [employer] advised him the tier request had been cancelled, and not sure who cancelled this. But pt would like a call back today and advise what he needs to do.

## 2017-12-30 NOTE — Telephone Encounter (Signed)
Spoke to pharmacist, stephanie, who states alternate covered medication is not suggested and she states a PA is not optional. Pharmacist indicates pt will need Rx for new medication. pls advise

## 2018-01-02 ENCOUNTER — Telehealth: Payer: Self-pay

## 2018-01-02 NOTE — Telephone Encounter (Signed)
Will defer to PCP, not urgent

## 2018-01-02 NOTE — Telephone Encounter (Signed)
Started PA on Cover My Meds. Can take up to 14 days for a response. I believe it may be denied. HE will have to try [place] before they will cover Colcrys. Will wait to see what the PA response is.

## 2018-01-02 NOTE — Telephone Encounter (Signed)
See previous TE I created in regards to the PA for Colcrys. They are going to make him try Mitigare before covering Colcrys or colchicine. This will have to wait for Dr Silvio Pate to return before changing the medication, per [facility].

## 2018-01-02 NOTE — Telephone Encounter (Signed)
[employer] called about the PA, contact [phone] ext [phone], ref#  [id number]

## 2018-01-02 NOTE — Telephone Encounter (Signed)
Form faxed back requesting is Rx for Colcrys or colchicine. I sent it back stating it was for Colcrys

## 2018-01-05 MED ORDER — MITIGARE 0.6 MG PO CAPS: 1.0000 | ORAL_CAPSULE | Freq: Two times a day (BID) | ORAL | 1 refills | Status: DC

## 2018-01-05 NOTE — Addendum Note (Signed)
Addended by: Pilar Grammes on: 01/05/2018 10:48 AM   Modules accepted: Orders

## 2018-01-05 NOTE — Telephone Encounter (Signed)
PA for this medication was denied and the information was faxed back.

## 2018-01-05 NOTE — Telephone Encounter (Signed)
New rx sent in for Sean Garcia.

## 2018-01-05 NOTE — Telephone Encounter (Signed)
I think that is the same exact thing---colchicine Just give the mitigare--same instructions

## 2018-01-05 NOTE — Telephone Encounter (Signed)
Insurance requires he try and fail Mitigare before covering [place].

## 2018-01-06 ENCOUNTER — Telehealth: Payer: Self-pay

## 2018-01-06 NOTE — Telephone Encounter (Signed)
Patient came by the office and dropped off a letter for Dr Silvio Pate to review please, about a medication, everything is in the letter per patient. Patient does have an appointment to come in on Wednesday 01/11/18 for follow up already that was scheduled prior, but would like to hear back from Dr Silvio Pate on his thoughts after reviewing this letter before the appointment. Placed letter in Dr Alla German inbox to review. Patient's CB is [phone].-[id number] Estell Harpin, RMA

## 2018-01-06 NOTE — Telephone Encounter (Signed)
Spoke to pt. He understands. He was not aware that Mitigare was sent in.

## 2018-01-06 NOTE — Telephone Encounter (Signed)
Please call him The colchicine has been approved--under a different name. In review, it doesn't look like we have discussed this medication ---my oversight. I assume you have gout--we will discuss this at your upcoming appt. Even if you have gout, you may not need to take the colchicine every day The allopurinol is meant to decrease levels of uric acid--that cause the gout. I can check this level with your blood work and we can consider this if necessary

## 2018-01-09 ENCOUNTER — Encounter: Payer: Self-pay | Admitting: Gastroenterology

## 2018-01-11 ENCOUNTER — Ambulatory Visit (INDEPENDENT_AMBULATORY_CARE_PROVIDER_SITE_OTHER): Payer: BLUE CROSS/BLUE SHIELD | Admitting: Internal Medicine

## 2018-01-11 ENCOUNTER — Encounter: Payer: Self-pay | Admitting: Internal Medicine

## 2018-01-11 VITALS — BP 126/90 | HR 61 | Temp 98.1°F | Ht 73.0 in | Wt 262.0 lb

## 2018-01-11 DIAGNOSIS — Z0000 Encounter for general adult medical examination without abnormal findings: Secondary | ICD-10-CM | POA: Diagnosis not present

## 2018-01-11 DIAGNOSIS — M159 Polyosteoarthritis, unspecified: Secondary | ICD-10-CM

## 2018-01-11 DIAGNOSIS — Z125 Encounter for screening for malignant neoplasm of prostate: Secondary | ICD-10-CM

## 2018-01-11 DIAGNOSIS — I10 Essential (primary) hypertension: Secondary | ICD-10-CM

## 2018-01-11 DIAGNOSIS — M1000 Idiopathic gout, unspecified site: Secondary | ICD-10-CM | POA: Diagnosis not present

## 2018-01-11 LAB — COMPREHENSIVE METABOLIC PANEL
ALBUMIN: 4.1 g/dL (ref 3.5–5.2)
ALK PHOS: 89 U/L (ref 39–117)
ALT: 19 U/L (ref 0–53)
AST: 23 U/L (ref 0–37)
BILIRUBIN TOTAL: 0.8 mg/dL (ref 0.2–1.2)
BUN: 15 mg/dL (ref 6–23)
CO2: 31 mEq/L (ref 19–32)
Calcium: 10 mg/dL (ref 8.4–10.5)
Chloride: 105 mEq/L (ref 96–112)
Creatinine, Ser: 1.08 mg/dL (ref 0.40–1.50)
GFR: 72.94 mL/min (ref >60.00)
GLUCOSE: 92 mg/dL (ref 70–99)
Potassium: 4.5 mEq/L (ref 3.5–5.1)
SODIUM: 141 meq/L (ref 135–145)
TOTAL PROTEIN: 6.7 g/dL (ref 6.0–8.3)

## 2018-01-11 LAB — LIPID PANEL
CHOLESTEROL: 117 mg/dL (ref 0–200)
HDL: 33.4 mg/dL — ABNORMAL LOW (ref >39.00)
LDL Cholesterol: 67 mg/dL (ref 0–99)
NONHDL: 83.54
Total CHOL/HDL Ratio: 4
Triglycerides: 81 mg/dL (ref 0.0–149.0)
VLDL: 16.2 mg/dL (ref 0.0–40.0)

## 2018-01-11 LAB — CBC
HCT: 43.2 % (ref 39.0–52.0)
HEMOGLOBIN: 14.6 g/dL (ref 13.0–17.0)
MCHC: 33.7 g/dL (ref 30.0–36.0)
MCV: 85.8 fl (ref 78.0–100.0)
PLATELETS: 192 10*3/uL (ref 150.0–400.0)
RBC: 5.03 Mil/uL (ref 4.22–5.81)
RDW: 13.8 % (ref 11.5–15.5)
WBC: 6.4 10*3/uL (ref 4.0–10.5)

## 2018-01-11 LAB — PSA: PSA: 0.38 ng/mL (ref 0.10–4.00)

## 2018-01-11 LAB — URIC ACID: URIC ACID, SERUM: 8.1 mg/dL — AB (ref 4.0–7.8)

## 2018-01-11 MED ORDER — MITIGARE 0.6 MG PO CAPS: 1.0000 | ORAL_CAPSULE | Freq: Two times a day (BID) | ORAL | 0 refills | Status: DC | PRN

## 2018-01-11 NOTE — Patient Instructions (Signed)
Only take the mitigare if you have a flare of your gout.  You are due for your colonoscopy in August---you should get a reminder letter.

## 2018-01-11 NOTE — Progress Notes (Signed)
Subjective:    Patient ID: Sean Garcia, male    DOB: January 19, 1951, 67 y.o.   MRN: [id number]  HPI Here for physical Goes on Medicare next month  Doing well Just some trouble with arthritis meloxicam in the past---off this now advil at times---but not often, like before exercise Discussed trying tylenol Gout doesn't seem active---uses the colchicine daily   Gained back some weight Will always be a struggle Off the phentermine  Continues work-does the typical calls, etc (clergy)  Current Outpatient Medications on File Prior to Visit  Medication Sig Dispense Refill  . atorvastatin (LIPITOR) 20 MG tablet TAKE 1 TABLET(20 MG) BY MOUTH DAILY 90 tablet 0  . bisoprolol (ZEBETA) 10 MG tablet TAKE 1 TABLET BY MOUTH EVERY DAY 90 tablet 3  . cyclobenzaprine (FLEXERIL) 10 MG tablet Take 1 tablet (10 mg total) by mouth 2 (two) times daily as needed for muscle spasms. 14 tablet 0  . desoximetasone (TOPICORT) 0.25 % cream APPLY EXTERNALLY TO THE AFFECTED AREA TWICE DAILY AS NEEDED FOR RASH 60 g 5  . fluocinolone (SYNALAR) 0.01 % external solution APP ON THE SKIN D PRN  3  . lisinopril (PRINIVIL,ZESTRIL) 40 MG tablet TAKE 1 TABLET(40 MG) BY MOUTH DAILY 90 tablet 0  . vardenafil (LEVITRA) 20 MG tablet Take 1 tablet (20 mg total) by mouth daily as needed. 10 tablet 11   No current facility-administered medications on file prior to visit.     Allergies  Allergen Reactions  . Codeine     Past Medical History:  Diagnosis Date  . ED (erectile dysfunction)   . Hyperlipidemia   . Hypertension   . Obesity   . Osteoarthritis     Past Surgical History:  Procedure Laterality Date  . COLONOSCOPY  2009   benign polyp, rpt 10 yrs [facility])  . KNEE SURGERY     for bowlegs, age 63  . PATELLAR TENDON REPAIR  09/2008   left, Dr. Leilani Merl in [place]  . TOTAL KNEE ARTHROPLASTY  2007   bilateral  . TOTAL SHOULDER REPLACEMENT  05/2009   left     Family History  Problem Relation Age of Onset  .  Hyperlipidemia Mother   . Cancer Mother        lymphoma  . Coronary artery disease Father   . Heart disease Father        CHF    Social History   Socioeconomic History  . Marital status: Married    Spouse name: Not on file  . Number of children: 2  . Years of education: Not on file  . Highest education level: Not on file  Occupational History  . Occupation: Landscape architect of [employer]    Employer: [employer]  . Financial resource strain: Not on file  . Food insecurity:    Worry: Not on file    Inability: Not on file  . Transportation needs:    Medical: Not on file    Non-medical: Not on file  Tobacco Use  . Smoking status: Former Research scientist (life sciences)  . Smokeless tobacco: Never Used  . Tobacco comment: Quit in his 62's.  Substance and Sexual Activity  . Alcohol use: No  . Drug use: Not on file  . Sexual activity: Not on file  Lifestyle  . Physical activity:    Days per week: Not on file    Minutes per session: Not on file  . Stress: Not on file  Relationships  .  Social connections:    Talks on phone: Not on file    Gets together: Not on file    Attends religious service: Not on file    Active member of club or organization: Not on file    Attends meetings of clubs or organizations: Not on file    Relationship status: Not on file  . Intimate partner violence:    Fear of current or ex partner: Not on file    Emotionally abused: Not on file    Physically abused: Not on file    Forced sexual activity: Not on file  Other Topics Concern  . Not on file  Social History Narrative  . Not on file   Review of Systems  Constitutional: Negative for fatigue.       Wears seat belt  HENT: Positive for tinnitus. Negative for dental problem, hearing loss and trouble swallowing.        Keeps up with dentist  Eyes: Negative for visual disturbance.       No diplopia or unilateral vision loss  Respiratory: Negative for cough, chest tightness and  shortness of breath.   Cardiovascular: Negative for chest pain and palpitations.       Slight leg swelling if up on feet a lot  Gastrointestinal: Negative for abdominal pain, blood in stool and constipation.       No regular heartburn--- only if eats too late  Endocrine: Negative for polydipsia and polyuria.  Genitourinary: Negative for difficulty urinating and urgency.       Levitra helps ED   Musculoskeletal: Positive for arthralgias. Negative for joint swelling.  Skin: Negative for rash.       Did have Fenton on nose---keeps up with derm  Allergic/Immunologic: Negative for environmental allergies and immunocompromised state.  Neurological: Negative for dizziness, syncope, light-headedness and headaches.  Hematological: Negative for adenopathy. Does not bruise/bleed easily.  Psychiatric/Behavioral: Negative for dysphoric mood and sleep disturbance. The patient is not nervous/anxious.        Objective:   Physical Exam  Constitutional: He is oriented to person, place, and time. He appears well-developed. No distress.  HENT:  Head: Normocephalic and atraumatic.  Right Ear: External ear normal.  Left Ear: External ear normal.  Mouth/Throat: Oropharynx is clear and moist. No oropharyngeal exudate.  Eyes: Pupils are equal, round, and reactive to light. Conjunctivae are normal.  Neck: No thyromegaly present.  Cardiovascular: Normal rate, regular rhythm, normal heart sounds and intact distal pulses. Exam reveals no gallop.  No murmur heard. Respiratory: Effort normal and breath sounds normal. No respiratory distress. He has no wheezes. He has no rales.  GI: Soft. There is no tenderness.  Musculoskeletal: He exhibits no edema or tenderness.  Lymphadenopathy:    He has no cervical adenopathy.  Neurological: He is alert and oriented to person, place, and time.  Skin: No rash noted. No erythema.  Psychiatric: He has a normal mood and affect. His behavior is normal.             Assessment & Plan:

## 2018-01-11 NOTE — Assessment & Plan Note (Signed)
Discussed tylenol  Limit NSAIDs

## 2018-01-11 NOTE — Assessment & Plan Note (Signed)
No recent problems Will change colchicine to prn Consider allopurinol if daily prevention needed

## 2018-01-11 NOTE — Assessment & Plan Note (Signed)
BP Readings from Last 3 Encounters:  01/11/18 126/90  06/14/17 138/88  06/03/17 (!) 148/86   Control okay now Will check labs

## 2018-01-11 NOTE — Assessment & Plan Note (Signed)
Healthy but needs to do better with fitness Will check PSA after discussion Colon due in August Yearly flu vaccine

## 2018-01-13 ENCOUNTER — Other Ambulatory Visit: Payer: Self-pay | Admitting: Internal Medicine

## 2018-02-01 ENCOUNTER — Encounter: Payer: Self-pay | Admitting: Gastroenterology

## 2018-02-05 ENCOUNTER — Other Ambulatory Visit: Payer: Self-pay | Admitting: Internal Medicine

## 2018-02-06 NOTE — Telephone Encounter (Signed)
Pt seen with labs 01/14/18*. Per protocol will refill lisinopril and at atorvastatin.

## 2018-02-17 ENCOUNTER — Encounter: Payer: Self-pay | Admitting: Gastroenterology

## 2018-03-16 DIAGNOSIS — Z96612 Presence of left artificial shoulder joint: Secondary | ICD-10-CM | POA: Diagnosis not present

## 2018-03-16 DIAGNOSIS — Z471 Aftercare following joint replacement surgery: Secondary | ICD-10-CM | POA: Diagnosis not present

## 2018-03-16 DIAGNOSIS — Z96653 Presence of artificial knee joint, bilateral: Secondary | ICD-10-CM | POA: Diagnosis not present

## 2018-03-16 DIAGNOSIS — T8484XA Pain due to internal orthopedic prosthetic devices, implants and grafts, initial encounter: Secondary | ICD-10-CM | POA: Diagnosis not present

## 2018-03-28 ENCOUNTER — Encounter: Payer: BLUE CROSS/BLUE SHIELD | Admitting: Gastroenterology

## 2018-04-05 ENCOUNTER — Ambulatory Visit (AMBULATORY_SURGERY_CENTER): Payer: Medicare Other | Admitting: *Deleted

## 2018-04-05 VITALS — Ht 73.0 in | Wt 270.0 lb

## 2018-04-05 DIAGNOSIS — Z1211 Encounter for screening for malignant neoplasm of colon: Secondary | ICD-10-CM

## 2018-04-05 MED ORDER — NA SULFATE-K SULFATE-MG SULF 17.5-3.13-1.6 GM/177ML PO SOLN: ORAL | 0 refills | Status: DC

## 2018-04-05 NOTE — Progress Notes (Signed)
Patient denies any allergies to eggs or soy. Patient denies any problems with anesthesia/sedation. Patient denies any oxygen use at home. Patient denies taking any diet/weight loss medications or blood thinners. EMMI education assisgned to patient on colonoscopy, this was explained and instructions given to patient. Patient may need shoulder sx on left, he is aware to call us back to reschedule 2 days prior if needed and that he must be healed and cleared from surgeon if he does have the surgery before a colonoscopy can be done.

## 2018-04-10 DIAGNOSIS — Z96619 Presence of unspecified artificial shoulder joint: Secondary | ICD-10-CM | POA: Diagnosis not present

## 2018-04-10 DIAGNOSIS — M25512 Pain in left shoulder: Secondary | ICD-10-CM | POA: Diagnosis not present

## 2018-04-10 DIAGNOSIS — T84038A Mechanical loosening of other internal prosthetic joint, initial encounter: Secondary | ICD-10-CM | POA: Diagnosis not present

## 2018-04-11 ENCOUNTER — Other Ambulatory Visit: Payer: Self-pay | Admitting: Internal Medicine

## 2018-04-19 ENCOUNTER — Ambulatory Visit (AMBULATORY_SURGERY_CENTER): Payer: Medicare Other | Admitting: Gastroenterology

## 2018-04-19 ENCOUNTER — Encounter: Payer: Self-pay | Admitting: Gastroenterology

## 2018-04-19 VITALS — BP 146/88 | HR 65 | Temp 97.7°F | Resp 20 | Ht 73.0 in | Wt 262.0 lb

## 2018-04-19 DIAGNOSIS — Z1211 Encounter for screening for malignant neoplasm of colon: Secondary | ICD-10-CM | POA: Diagnosis not present

## 2018-04-19 DIAGNOSIS — D126 Benign neoplasm of colon, unspecified: Secondary | ICD-10-CM

## 2018-04-19 DIAGNOSIS — D122 Benign neoplasm of ascending colon: Secondary | ICD-10-CM | POA: Diagnosis not present

## 2018-04-19 DIAGNOSIS — D123 Benign neoplasm of transverse colon: Secondary | ICD-10-CM | POA: Diagnosis not present

## 2018-04-19 DIAGNOSIS — K635 Polyp of colon: Secondary | ICD-10-CM

## 2018-04-19 MED ORDER — SODIUM CHLORIDE 0.9 % IV SOLN: 500.0000 mL | Freq: Once | INTRAVENOUS | Status: DC

## 2018-04-19 NOTE — Progress Notes (Signed)
Called to room to assist during endoscopic procedure.  Patient ID and intended procedure confirmed with present staff. Received instructions for my participation in the procedure from the performing physician.  

## 2018-04-19 NOTE — Patient Instructions (Signed)
YOU HAD AN ENDOSCOPIC PROCEDURE TODAY AT [facility]:   Refer to the procedure report that was given to you for any specific questions about what was found during the examination.  If the procedure report does not answer your questions, please call your gastroenterologist to clarify.  If you requested that your care partner not be given the details of your procedure findings, then the procedure report has been included in a sealed envelope for you to review at your convenience later.  YOU SHOULD EXPECT: Some feelings of bloating in the abdomen. Passage of more gas than usual.  Walking can help get rid of the air that was put into your GI tract during the procedure and reduce the bloating. If you had a lower endoscopy (such as a colonoscopy or flexible sigmoidoscopy) you may notice spotting of blood in your stool or on the toilet paper. If you underwent a bowel prep for your procedure, you may not have a normal bowel movement for a few days.  Please Note:  You might notice some irritation and congestion in your nose or some drainage.  This is from the oxygen used during your procedure.  There is no need for concern and it should clear up in a day or so.  SYMPTOMS TO REPORT IMMEDIATELY:   Following lower endoscopy (colonoscopy or flexible sigmoidoscopy):  Excessive amounts of blood in the stool  Significant tenderness or worsening of abdominal pains  Swelling of the abdomen that is new, acute  Fever of 100F or higher  For urgent or emergent issues, a gastroenterologist can be reached at any hour by calling [phone].   DIET:  We do recommend a small meal at first, but then you may proceed to your regular diet.  Drink plenty of fluids but you should avoid alcoholic beverages for 24 hours.  MEDICATIONS: Continue present medications.  Please see handouts given to you by your recovery nurse.  ACTIVITY:  You should plan to take it easy for the rest of today and you should NOT  DRIVE or use heavy machinery until tomorrow (because of the sedation medicines used during the test).    FOLLOW UP: Our staff will call the number listed on your records the next business day following your procedure to check on you and address any questions or concerns that you may have regarding the information given to you following your procedure. If we do not reach you, we will leave a message.  However, if you are feeling well and you are not experiencing any problems, there is no need to return our call.  We will assume that you have returned to your regular daily activities without incident.  If any biopsies were taken you will be contacted by phone or by letter within the next 1-3 weeks.  Please call us at [phone] if you have not heard about the biopsies in 3 weeks.    SIGNATURES/CONFIDENTIALITY: You and/or your care partner have signed paperwork which will be entered into your electronic medical record.  These signatures attest to the fact that that the information above on your After Visit Summary has been reviewed and is understood.  Full responsibility of the confidentiality of this discharge information lies with you and/or your care-partner.

## 2018-04-19 NOTE — Op Note (Signed)
[facility] Patient Name: Sean Garcia Procedure Date: 04/19/2018 10:24 AM MRN: [id number] Endoscopist: Ladene Artist , MD Age: 67 Referring MD:  Date of Birth: [birth year] Gender: Male Account #: [id number] Procedure:                Colonoscopy Indications:              Screening for colorectal malignant neoplasm Medicines:                Monitored Anesthesia Care Procedure:                Pre-Anesthesia Assessment:                           - Prior to the procedure, a History and Physical                            was performed, and patient medications and                            allergies were reviewed. The patient's tolerance of                            previous anesthesia was also reviewed. The risks                            and benefits of the procedure and the sedation                            options and risks were discussed with the patient.                            All questions were answered, and informed consent                            was obtained. Prior Anticoagulants: The patient has                            taken no previous anticoagulant or antiplatelet                            agents. ASA Grade Assessment: II - A patient with                            mild systemic disease. After reviewing the risks                            and benefits, the patient was deemed in                            satisfactory condition to undergo the procedure.                           After obtaining informed consent, the colonoscope  was passed under direct vision. Throughout the                            procedure, the patient's blood pressure, pulse, and                            oxygen saturations were monitored continuously. The                            Colonoscope was introduced through the anus and                            advanced to the the cecum, identified by                            appendiceal orifice and  ileocecal valve. The                            ileocecal valve, appendiceal orifice, and rectum                            were photographed. The quality of the bowel                            preparation was good. The colonoscopy was performed                            without difficulty. The patient tolerated the                            procedure well. Scope In: 10:35:55 AM Scope Out: 10:51:51 AM Scope Withdrawal Time: 0 hours 12 minutes 26 seconds  Total Procedure Duration: 0 hours 15 minutes 56 seconds  Findings:                 The perianal and digital rectal examinations were                            normal.                           A 8 mm polyp was found in the transverse colon. The                            polyp was semi-pedunculated. The polyp was removed                            with a cold snare. Resection and retrieval were                            complete.                           Three sessile polyps were found in the transverse  colon (1) and ascending colon (2). The polyps were                            4 to 5 mm in size.                           Internal hemorrhoids were found during                            retroflexion. The hemorrhoids were small and Grade                            I (internal hemorrhoids that do not prolapse).                           The exam was otherwise without abnormality on                            direct and retroflexion views. Complications:            No immediate complications. Estimated blood loss:                            None. Estimated Blood Loss:     Estimated blood loss: none. Impression:               - One 8 mm polyp in the transverse colon, removed                            with a cold snare. Resected and retrieved.                           - Three 4 to 5 mm polyps in the transverse colon                            and in the ascending colon.                           -  Internal hemorrhoids.                           - The examination was otherwise normal on direct                            and retroflexion views. Recommendation:           - Repeat colonoscopy in 3 - 5 years for                            surveillance if polyp(s) are adenomatous, otherwise                            10 years.                           - Patient has a contact number available for  emergencies. The signs and symptoms of potential                            delayed complications were discussed with the                            patient. Return to normal activities tomorrow.                            Written discharge instructions were provided to the                            patient.                           - Resume previous diet.                           - Continue present medications.                           - Await pathology results. Ladene Artist, MD 04/19/2018 10:55:00 AM This report has been signed electronically.

## 2018-04-19 NOTE — Progress Notes (Signed)
Pt's states no medical or surgical changes since previsit or office visit. 

## 2018-04-19 NOTE — Progress Notes (Signed)
A and O x3. Report to RN. Tolerated MAC anesthesia well.

## 2018-04-20 ENCOUNTER — Telehealth: Payer: Self-pay | Admitting: *Deleted

## 2018-04-20 ENCOUNTER — Telehealth: Payer: Self-pay

## 2018-04-20 NOTE — Telephone Encounter (Signed)
  Follow up Call-  Call back number 04/19/2018  Post procedure Call Back phone  # [phone]  Permission to leave phone message Yes  Some recent data might be hidden     Patient questions:  Do you have a fever, pain , or abdominal swelling? No. Pain Score  0 *  Have you tolerated food without any problems? Yes.    Have you been able to return to your normal activities? Yes.    Do you have any questions about your discharge instructions: Diet   No. Medications  No. Follow up visit  No.  Do you have questions or concerns about your Care? No.  Actions: * If pain score is 4 or above: No action needed, pain <4.

## 2018-04-20 NOTE — Telephone Encounter (Signed)
No answer. Name identifier. Message left to call if questions or concerns and we will attempt to call later in the day.

## 2018-04-27 DIAGNOSIS — M25512 Pain in left shoulder: Secondary | ICD-10-CM | POA: Diagnosis not present

## 2018-05-08 ENCOUNTER — Encounter: Payer: Self-pay | Admitting: Gastroenterology

## 2018-05-08 ENCOUNTER — Other Ambulatory Visit: Payer: Self-pay | Admitting: Internal Medicine

## 2018-05-08 ENCOUNTER — Telehealth: Payer: Self-pay

## 2018-05-08 MED ORDER — MITIGARE 0.6 MG PO CAPS: 1.0000 | ORAL_CAPSULE | Freq: Two times a day (BID) | ORAL | 0 refills | Status: DC | PRN

## 2018-05-08 NOTE — Telephone Encounter (Signed)
Spoke to pt. He has not checked with [facility] to see what they covered. I explained I sent in th rx for Mitigare as we did last time. Not generic colchicine. He will let me know if there are any issues.

## 2018-05-08 NOTE — Telephone Encounter (Signed)
Please check with pharmacist They will often cover a different manufacturer

## 2018-05-08 NOTE — Telephone Encounter (Signed)
Copied from [place] [id number]. Topic: General - Other >> May 08, 2018  4:32 PM Yvette Rack wrote: Reason for CRM: Pt states he was prescribed colchicine (COLCRYS) 0.6 MG tablet but now that he has Medicare that medication is not covered. Pt requests a Rx for an alternate medication. Cb# [phone]

## 2018-05-09 ENCOUNTER — Other Ambulatory Visit: Payer: Self-pay | Admitting: Internal Medicine

## 2018-05-09 ENCOUNTER — Telehealth: Payer: Self-pay

## 2018-05-09 MED ORDER — BISOPROLOL FUMARATE 10 MG PO TABS: 10.0000 mg | ORAL_TABLET | Freq: Every day | ORAL | 3 refills | Status: DC

## 2018-05-09 NOTE — Telephone Encounter (Signed)
See telephone note 05/08/18.

## 2018-05-09 NOTE — Telephone Encounter (Signed)
Sent in bisoprolol. The pharmacy sent a request for colchicine caps. I sent that this morning.

## 2018-05-09 NOTE — Telephone Encounter (Signed)
Copied from [employer] [id number]. Topic: General - Other >> May 09, 2018  9:27 AM Carolyn Stare wrote:  Pt was told Mitigare was going to be called in and he has checked with the pharmacy and they do not have   he is also requesting a refill on  bisoprolol (ZEBETA) 10 MG tablet  And is asking to pick up these meds today    Pharmacy [facility] [place]

## 2018-06-09 ENCOUNTER — Other Ambulatory Visit: Payer: Self-pay

## 2018-06-09 ENCOUNTER — Emergency Department: Payer: Medicare Other

## 2018-06-09 ENCOUNTER — Telehealth: Payer: Self-pay | Admitting: *Deleted

## 2018-06-09 ENCOUNTER — Emergency Department
Admission: EM | Admit: 2018-06-09 | Discharge: 2018-06-10 | Disposition: A | Discharge status: Home / Self Care | Payer: Medicare Other | Attending: Emergency Medicine | Admitting: Emergency Medicine

## 2018-06-09 DIAGNOSIS — Z96653 Presence of artificial knee joint, bilateral: Secondary | ICD-10-CM | POA: Diagnosis not present

## 2018-06-09 DIAGNOSIS — I10 Essential (primary) hypertension: Secondary | ICD-10-CM | POA: Diagnosis not present

## 2018-06-09 DIAGNOSIS — E669 Obesity, unspecified: Secondary | ICD-10-CM | POA: Diagnosis not present

## 2018-06-09 DIAGNOSIS — K219 Gastro-esophageal reflux disease without esophagitis: Secondary | ICD-10-CM | POA: Diagnosis not present

## 2018-06-09 DIAGNOSIS — Z87891 Personal history of nicotine dependence: Secondary | ICD-10-CM | POA: Insufficient documentation

## 2018-06-09 DIAGNOSIS — Z96612 Presence of left artificial shoulder joint: Secondary | ICD-10-CM | POA: Insufficient documentation

## 2018-06-09 DIAGNOSIS — Z79899 Other long term (current) drug therapy: Secondary | ICD-10-CM | POA: Diagnosis not present

## 2018-06-09 DIAGNOSIS — Z85828 Personal history of other malignant neoplasm of skin: Secondary | ICD-10-CM | POA: Diagnosis not present

## 2018-06-09 DIAGNOSIS — Z6834 Body mass index (BMI) 34.0-34.9, adult: Secondary | ICD-10-CM | POA: Diagnosis not present

## 2018-06-09 DIAGNOSIS — Z01812 Encounter for preprocedural laboratory examination: Secondary | ICD-10-CM | POA: Diagnosis not present

## 2018-06-09 DIAGNOSIS — R030 Elevated blood-pressure reading, without diagnosis of hypertension: Secondary | ICD-10-CM | POA: Diagnosis not present

## 2018-06-09 MED ORDER — CLONIDINE HCL 0.1 MG PO TABS
0.1000 mg | ORAL_TABLET | Freq: Once | ORAL | Status: AC
  Administered 2018-06-09: 0.1 mg via ORAL
  Filled 2018-06-09: qty 1

## 2018-06-09 NOTE — ED Provider Notes (Signed)
[facility] Emergency Department Provider Note   ____________________________________________   First MD Initiated Contact with Patient 06/09/18 2310     (approximate)  I have reviewed the triage vital signs and the nursing notes.   HISTORY  Chief Complaint Hypertension    HPI KHI MCMILLEN is a 67 y.o. male who presents to the ED from home with a chief complaint of elevated blood pressure.  Patient has a history of hypertension, takes Lisinopril 40 mg daily as well as Zebeta 10 mg daily.  States he has been taking these medicines for years.  Patient was in [place] for a preop visit with his orthopedic surgeon and his blood pressure was noted to be elevated at 199/103.  They recommended that he have a checkup with his PCP.  Patient tried to make an appointment with his PCP today but the nurse refused to make him an appointment because she was afraid he would not come to the ED for evaluation.  On the way home [facility] and the diastolic remained high around 110s.  Took his blood pressure several times at home with systolic coming down to the 761Y but his diastolic remained high.  Patient denies headache, vision changes, neck pain, chest pain, shortness of breath, abdominal pain, nausea, vomiting, or dizziness.  Reports he did have a recent chest cold last week for which he was taking Coricidin.  Also reports gout flareup last week for which he took colchicine.  Denies recent trauma.   Past Medical History:  Diagnosis Date  . Cancer (HCC)    Basal cell  . ED (erectile dysfunction)   . Gout   . Hyperlipidemia   . Hypertension   . Obesity   . Osteoarthritis     Patient Active Problem List   Diagnosis Date Noted  . Candidal intertrigo 12/13/2014  . Routine general medical examination at a health care facility 06/11/2013  . Gout 12/11/2009  . Hyperlipemia 10/05/2007  . Obesity 10/05/2007  . Essential hypertension,  benign 10/05/2007  . 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 Generalized osteoarthritis of multiple sites 10/05/2007    Past Surgical History:  Procedure Laterality Date  . COLONOSCOPY  2009   benign polyp, rpt 10 yrs Fuller Plan)  . KNEE SURGERY     for bowlegs, age 65  . PATELLAR TENDON REPAIR  09/2008   left, Dr. Leilani Merl in [place]  . TOTAL KNEE ARTHROPLASTY  2007   bilateral  . TOTAL SHOULDER REPLACEMENT  05/2009   left     Prior to Admission medications   Medication Sig Start Date End Date Taking? Authorizing Provider  atorvastatin (LIPITOR) 20 MG tablet TAKE 1 TABLET(20 MG) BY MOUTH DAILY 02/06/18   Viviana Simpler I, MD  bisoprolol (ZEBETA) 10 MG tablet Take 1 tablet (10 mg total) by mouth daily. 05/09/18   Venia Carbon, MD  COLCRYS 0.6 MG tablet Take 1 tablet (0.6 mg total) by mouth 2 (two) times daily as needed. 05/09/18   Venia Carbon, MD  desoximetasone (TOPICORT) 0.25 % cream APPLY EXTERNALLY TO THE AFFECTED AREA TWICE DAILY AS NEEDED FOR RASH 01/16/18   Venia Carbon, MD  fluocinolone (SYNALAR) 0.01 % external solution APP ON THE SKIN D PRN 12/14/17   [provider]  lisinopril (PRINIVIL,ZESTRIL) 40 MG tablet TAKE 1 TABLET(40 MG) BY MOUTH DAILY 02/06/18   Venia Carbon, MD  vardenafil (LEVITRA) 20 MG tablet Take 1 tablet (20 mg  total) by mouth daily as needed. 04/23/16   Venia Carbon, MD    Allergies Codeine  Family History  Problem Relation Age of Onset  . Hyperlipidemia Mother   . Cancer Mother        lymphoma  . Coronary artery disease Father   . Heart disease Father        CHF  . Colon cancer Neg Hx   . Colon polyps Neg Hx   . Esophageal cancer Neg Hx   . Stomach cancer Neg Hx   . Rectal cancer Neg Hx     Social History Social History   Tobacco Use  . Smoking status: Former Research scientist (life sciences)  . Smokeless tobacco: Never Used  . Tobacco comment: Quit in his 71's.  Substance Use Topics  . Alcohol use: No  . Drug use: Not Currently      Review of Systems  Constitutional: Positive for elevated blood pressure.  No fever/chills Eyes: No visual changes. ENT: No sore throat. Cardiovascular: Denies chest pain. Respiratory: Denies shortness of breath. Gastrointestinal: No abdominal pain.  No nausea, no vomiting.  No diarrhea.  No constipation. Genitourinary: Negative for dysuria. Musculoskeletal: Negative for back pain. Skin: Negative for rash. Neurological: Negative for headaches, focal weakness or numbness.   ____________________________________________   PHYSICAL EXAM:  VITAL SIGNS: ED Triage Vitals  Enc Vitals Group     BP 06/09/18 2254 (!) 172/115     Pulse Rate 06/09/18 2254 64     Resp 06/09/18 2254 18     Temp 06/09/18 2254 98.1 F (36.7 C)     Temp Source 06/09/18 2254 Oral     SpO2 06/09/18 2254 97 %     Weight 06/09/18 2256 265 lb (120.2 kg)     Height 06/09/18 2256 6\' 2"  (1.88 m)     Head Circumference --      Peak Flow --      Pain Score 06/09/18 2256 0     Pain Loc --      Pain Edu? --      Excl. in [place]? --     Constitutional: Alert and oriented. Well appearing and in no acute distress. Eyes: Conjunctivae are normal. PERRL. EOMI. Head: Atraumatic. Nose: No congestion/rhinnorhea. Mouth/Throat: Mucous membranes are moist.  Oropharynx non-erythematous. Neck: No stridor.  No carotid bruits.  Supple neck without meningismus. Cardiovascular: Normal rate, regular rhythm. Grossly normal heart sounds.  Good peripheral circulation. Respiratory: Normal respiratory effort.  No retractions. Lungs CTAB. Gastrointestinal: Soft and nontender. No distention. No abdominal bruits. No CVA tenderness. Musculoskeletal: No lower extremity tenderness nor edema.  No joint effusions. Neurologic:  Normal speech and language. No gross focal neurologic deficits are appreciated. No gait instability. Skin:  Skin is warm, dry and intact. No rash noted. Psychiatric: Mood and affect are normal. Speech and behavior are  normal.  ____________________________________________   LABS (all labs ordered are listed, but only abnormal results are displayed)  Labs Reviewed  CBC WITH DIFFERENTIAL/PLATELET  COMPREHENSIVE METABOLIC PANEL  TROPONIN I  URINALYSIS, COMPLETE (UACMP) WITH MICROSCOPIC  TSH  T4, FREE   ____________________________________________  EKG  ED ECG REPORT I, SUNG,JADE J, the attending physician, personally viewed and interpreted this ECG.   Date: 06/09/2018  EKG Time: 0120  Rate: 58  Rhythm: normal EKG, normal sinus rhythm  Axis: Normal  Intervals:none  ST&T Change: Nonspecific  ____________________________________________  RADIOLOGY  ED MD interpretation: No active cardiopulmonary process  Official radiology report(s): Dg Chest 2 View  Result  Date: 06/09/2018 CLINICAL DATA:  High blood pressure. EXAM: CHEST - 2 VIEW COMPARISON:  None. FINDINGS: Normal heart size and pulmonary vascularity. No focal airspace disease or consolidation in the lungs. No blunting of costophrenic angles. No pneumothorax. Mediastinal contours appear intact. Postoperative changes in the left shoulder. Calcified and tortuous aorta. IMPRESSION: No active cardiopulmonary disease. Electronically Signed   By: Lucienne Capers M.D.   On: 06/09/2018 23:35    ____________________________________________   PROCEDURES  Procedure(s) performed: None  Procedures  Critical Care performed: No  ____________________________________________   INITIAL IMPRESSION / ASSESSMENT AND PLAN / ED COURSE  As part of my medical decision making, I reviewed the following data within the [place] History obtained from family, Nursing notes reviewed and incorporated, Labs reviewed, EKG interpreted, Old chart reviewed, Radiograph reviewed and Notes from prior ED visits   67 year old male with hypertension who presents for evaluation of elevated blood pressure.  This is in the setting of recent cold  like symptoms as well as gout flareup.  Differential diagnosis includes but is not limited to medical noncompliance, anxiety, infectious, metabolic, cardiac etiologies, etc.  I discussed with the patient and his spouse blood pressure goals and asymptomatic hypertension.  Typically I would not treat it; however, patient's diastolic is elevated and patient is concerned he will have a stroke as he has a strong family history of strokes.  Would not lower it beyond 10%.  Will administer clonidine.  Check screening lab work including troponin, x-ray, EKG, urinalysis.  Will reassess.  Clinical Course as of Jun 10 237  Sat Jun 10, 2018  0231 Updated patient and spouse on all test results.  Diastolic blood pressure improved to 103.  Current BP 140/115.  Patient asymptomatic.  Given copy of his blood work and urinalysis.  Will discharge home with prescription for clonidine 0.1 mg with strict parameters to use.  Also advised increasing Zebeta to 15 mg daily.  Asked patient to log his blood pressures and take them to his PCP on Monday.  Strict return precautions given.  Both verbalize understanding and agree with plan of care.   [JS]    Clinical Course User Index [JS] Paulette Blanch, MD     ____________________________________________   FINAL CLINICAL IMPRESSION(S) / ED DIAGNOSES  Final diagnoses:  Essential hypertension     ED Discharge Orders    None       Note:  This document was prepared using Dragon voice recognition software and may include unintentional dictation errors.    Paulette Blanch, MD 06/10/18 [phone]

## 2018-06-09 NOTE — ED Triage Notes (Signed)
Patient presents for high blood pressure. Had preop visit today and was noted to have a high blood pressure. [facility] way home from [place] to check it and it was still elevated. The nurse from [facility] called him and told him to go the ED for evaluation.

## 2018-06-09 NOTE — Telephone Encounter (Signed)
Spoke to pt who states he was seen at surgeon's office today and his BP was 199/119 at appx 1500. He was not given any meds or advised on how to proceed, outside of scheduling a f/u with his PCP. Pt denies any SOB, chest pain, changes in vision or dizziness, or any other s/s. Pt reports his BP has a tendency to run high, but according to his chart, has not been this elevated. Pt is currently traveling from [place], back to [place] and is not wanting to stop along the way. I advised pt, per Leafy Ro, RN, he does not need to wait until Monday to be seen, but instead, will need to go to [facility] or ED as soon as possible for further eval as readings put him at increased risk for stroke.  PCP is currently out of the office at the present as it is 5 oclock on Friday and unable to provide further guidance at this time.     Pt also advised should he began to have any additional s/s while in route, he will need to seek medical attention immediately by pulling over the vehicle and calling 911.  No appt scheduled as pt states he will be seen tonight, when he arrives back in town.   Also, Lorre Munroe, consulted with Dr. Damita Dunnings, he is in agreement with plan. Patient should have bp rechecked as soon as possible.

## 2018-06-09 NOTE — Discharge Instructions (Addendum)
1.  Increase Zebeta to 15mg  daily. 2.  Take Clonidine tablet up to twice daily maximum for the following pressure parameters: Top number > 180 Bottom number > 100 3.  Keep a log of your blood pressure readings morning, noon and evening.  Please take this to your doctor on Monday.

## 2018-06-09 NOTE — Telephone Encounter (Signed)
Routed to PCP as FYI.

## 2018-06-09 NOTE — Telephone Encounter (Signed)
Thanks.  Agree with recheck tonight, sooner if any symptoms.

## 2018-06-10 DIAGNOSIS — I10 Essential (primary) hypertension: Secondary | ICD-10-CM | POA: Diagnosis not present

## 2018-06-10 LAB — COMPREHENSIVE METABOLIC PANEL
ALBUMIN: 3.9 g/dL (ref 3.5–5.0)
ALT: 22 U/L (ref 0–44)
ANION GAP: 7 (ref 5–15)
AST: 25 U/L (ref 15–41)
Alkaline Phosphatase: 89 U/L (ref 38–126)
BUN: 16 mg/dL (ref 8–23)
CHLORIDE: 105 mmol/L (ref 98–111)
CO2: 27 mmol/L (ref 22–32)
Calcium: 9.3 mg/dL (ref 8.9–10.3)
Creatinine, Ser: 0.87 mg/dL (ref 0.61–1.24)
GFR calc non Af Amer: >60 mL/min (ref >60)
GLUCOSE: 96 mg/dL (ref 70–99)
POTASSIUM: 3.5 mmol/L (ref 3.5–5.1)
SODIUM: 139 mmol/L (ref 135–145)
Total Bilirubin: 0.6 mg/dL (ref 0.3–1.2)
Total Protein: 6.8 g/dL (ref 6.5–8.1)

## 2018-06-10 LAB — URINALYSIS, COMPLETE (UACMP) WITH MICROSCOPIC
BACTERIA UA: NONE SEEN
BILIRUBIN URINE: NEGATIVE
GLUCOSE, UA: NEGATIVE mg/dL
HGB URINE DIPSTICK: NEGATIVE
Ketones, ur: NEGATIVE mg/dL
LEUKOCYTES UA: NEGATIVE
NITRITE: NEGATIVE
PROTEIN: NEGATIVE mg/dL
Specific Gravity, Urine: 1.009 (ref 1.005–1.030)
Squamous Epithelial / LPF: NONE SEEN (ref 0–5)
pH: 7 (ref 5.0–8.0)

## 2018-06-10 LAB — CBC WITH DIFFERENTIAL/PLATELET
Abs Immature Granulocytes: 0.03 10*3/uL (ref 0.00–0.07)
BASOS ABS: 0 10*3/uL (ref 0.0–0.1)
BASOS PCT: 0 %
EOS PCT: 4 %
Eosinophils Absolute: 0.4 10*3/uL (ref 0.0–0.5)
HCT: 42.9 % (ref 39.0–52.0)
Hemoglobin: 14.3 g/dL (ref 13.0–17.0)
Immature Granulocytes: 0 %
LYMPHS PCT: 33 %
Lymphs Abs: 2.7 10*3/uL (ref 0.7–4.0)
MCH: 28.9 pg (ref 26.0–34.0)
MCHC: 33.3 g/dL (ref 30.0–36.0)
MCV: 86.8 fL (ref 80.0–100.0)
MONO ABS: 0.9 10*3/uL (ref 0.1–1.0)
Monocytes Relative: 11 %
NEUTROS PCT: 52 %
NRBC: 0 % (ref 0.0–0.2)
Neutro Abs: 4.3 10*3/uL (ref 1.7–7.7)
PLATELETS: 218 10*3/uL (ref 150–400)
RBC: 4.94 MIL/uL (ref 4.22–5.81)
RDW: 12.8 % (ref 11.5–15.5)
WBC: 8.3 10*3/uL (ref 4.0–10.5)

## 2018-06-10 LAB — TSH: TSH: 2.502 u[IU]/mL (ref 0.350–4.500)

## 2018-06-10 LAB — T4, FREE: Free T4: 0.98 ng/dL (ref 0.82–1.77)

## 2018-06-10 LAB — TROPONIN I: Troponin I: <0.03 ng/mL (ref <0.03)

## 2018-06-10 MED ORDER — CLONIDINE HCL 0.1 MG PO TABS: 0.1000 mg | ORAL_TABLET | Freq: Two times a day (BID) | ORAL | 11 refills | Status: DC

## 2018-06-10 MED ORDER — CLONIDINE HCL 0.1 MG PO TABS
0.1000 mg | ORAL_TABLET | Freq: Once | ORAL | Status: AC
  Administered 2018-06-10: 0.1 mg via ORAL

## 2018-06-10 MED ORDER — CLONIDINE HCL 0.1 MG PO TABS: 0.1000 mg | ORAL_TABLET | Freq: Two times a day (BID) | ORAL | 0 refills | Status: DC | PRN

## 2018-06-10 MED ORDER — CLONIDINE HCL 0.1 MG PO TABS
ORAL_TABLET | ORAL | Status: AC
  Administered 2018-06-10: 0.1 mg via ORAL
  Filled 2018-06-10: qty 1

## 2018-06-10 NOTE — ED Notes (Signed)
Pt states that he went to pre-op and they noted that his blood pressure was high. Pt states that he just wanted it to get checked out and some medication to bring it down.

## 2018-06-10 NOTE — Telephone Encounter (Signed)
He did go to the ER, was treated and discharged Please set him up for follow up Monday or Tuesday

## 2018-06-12 ENCOUNTER — Encounter: Payer: Self-pay | Admitting: Internal Medicine

## 2018-06-12 ENCOUNTER — Ambulatory Visit (INDEPENDENT_AMBULATORY_CARE_PROVIDER_SITE_OTHER): Payer: Medicare Other | Admitting: Internal Medicine

## 2018-06-12 VITALS — BP 112/88 | HR 55 | Temp 98.0°F | Ht 73.0 in | Wt 271.0 lb

## 2018-06-12 DIAGNOSIS — Z23 Encounter for immunization: Secondary | ICD-10-CM

## 2018-06-12 DIAGNOSIS — I10 Essential (primary) hypertension: Secondary | ICD-10-CM

## 2018-06-12 MED ORDER — BISOPROLOL FUMARATE 10 MG PO TABS: 10.0000 mg | ORAL_TABLET | Freq: Every day | ORAL | 3 refills | Status: DC

## 2018-06-12 MED ORDER — COLCHICINE 0.6 MG PO TABS: 0.6000 mg | ORAL_TABLET | Freq: Two times a day (BID) | ORAL | 0 refills | Status: DC | PRN

## 2018-06-12 MED ORDER — AMLODIPINE BESYLATE 5 MG PO TABS: 5.0000 mg | ORAL_TABLET | Freq: Every day | ORAL | 3 refills | Status: DC

## 2018-06-12 NOTE — Assessment & Plan Note (Signed)
BP Readings from Last 3 Encounters:  06/12/18 112/88  06/10/18 (!) 139/96  04/19/18 (!) 146/88   Repeat after talking to me was 144/96 The extremely high measurements were likely aberrations  Discussed getting rid of the clonidine Will add amlodipine 5mg  to make sure his BP is lower

## 2018-06-12 NOTE — Telephone Encounter (Signed)
Left message to see how he is doing. He has an appt today at 2pm

## 2018-06-12 NOTE — Addendum Note (Signed)
Addended by: Pilar Grammes on: 06/12/2018 05:09 PM   Modules accepted: Orders

## 2018-06-12 NOTE — Progress Notes (Signed)
Subjective:    Patient ID: Sean Garcia, male    DOB: 07/05/1951, 67 y.o.   MRN: [id number]  HPI Here for ER follow up for elevated BP  In [facility] seeing orthopedist for left shoulder replacement planning Got BP taken-- 208/119 or something similar It stayed up after a few minutes Then got call from them checking on him---while still on the road. They wanted him checked right away  He felt fine No headaches No chest pain Slight SOB No dizziness  Bad cold the week before Was taking coricidin Then got bad gout attack  Took 1 clonidine the day after the ER visit Took another last night and this morning  Current Outpatient Medications on File Prior to Visit  Medication Sig Dispense Refill  . atorvastatin (LIPITOR) 20 MG tablet TAKE 1 TABLET(20 MG) BY MOUTH DAILY 90 tablet 30  . bisoprolol (ZEBETA) 10 MG tablet Take 1 tablet (10 mg total) by mouth daily. (Patient taking differently: Take 10 mg by mouth daily. Taking 1.5 tabs per ER) 90 tablet 3  . cloNIDine (CATAPRES) 0.1 MG tablet Take 1 tablet (0.1 mg total) by mouth 2 (two) times daily as needed. 10 tablet 0  . COLCRYS 0.6 MG tablet Take 1 tablet (0.6 mg total) by mouth 2 (two) times daily as needed. 30 tablet 0  . desoximetasone (TOPICORT) 0.25 % cream APPLY EXTERNALLY TO THE AFFECTED AREA TWICE DAILY AS NEEDED FOR RASH 60 g 0  . fluocinolone (SYNALAR) 0.01 % external solution APP ON THE SKIN D PRN  3  . lisinopril (PRINIVIL,ZESTRIL) 40 MG tablet TAKE 1 TABLET(40 MG) BY MOUTH DAILY 90 tablet 3  . vardenafil (LEVITRA) 20 MG tablet Take 1 tablet (20 mg total) by mouth daily as needed. 10 tablet 11   Current Facility-Administered Medications on File Prior to Visit  Medication Dose Route Frequency Provider Last Rate Last Dose  . 0.9 %  sodium chloride infusion  500 mL Intravenous Once Ladene Artist, MD        Allergies  Allergen Reactions  . Codeine Itching and Rash    Past Medical History:  Diagnosis Date  .  Cancer (HCC)    Basal cell  . ED (erectile dysfunction)   . Gout   . Hyperlipidemia   . Hypertension   . Obesity   . Osteoarthritis     Past Surgical History:  Procedure Laterality Date  . COLONOSCOPY  2009   benign polyp, rpt 10 yrs Fuller Plan)  . KNEE SURGERY     for bowlegs, age 61  . PATELLAR TENDON REPAIR  09/2008   left, Dr. Leilani Merl in [place]  . TOTAL KNEE ARTHROPLASTY  2007   bilateral  . TOTAL SHOULDER REPLACEMENT  05/2009   left     Family History  Problem Relation Age of Onset  . Hyperlipidemia Mother   . Cancer Mother        lymphoma  . Coronary artery disease Father   . Heart disease Father        CHF  . Colon cancer Neg Hx   . Colon polyps Neg Hx   . Esophageal cancer Neg Hx   . Stomach cancer Neg Hx   . Rectal cancer Neg Hx     Social History   Socioeconomic History  . Marital status: Married    Spouse name: Not on file  . Number of children: 2  . Years of education: Not on file  . Highest education level: Not on  file  Occupational History  . Occupation: Landscape architect of [employer]    Employer: [employer]  . Financial resource strain: Not on file  . Food insecurity:    Worry: Not on file    Inability: Not on file  . Transportation needs:    Medical: Not on file    Non-medical: Not on file  Tobacco Use  . Smoking status: Former Research scientist (life sciences)  . Smokeless tobacco: Never Used  . Tobacco comment: Quit in his 79's.  Substance and Sexual Activity  . Alcohol use: No  . Drug use: Not Currently  . Sexual activity: Not on file  Lifestyle  . Physical activity:    Days per week: Not on file    Minutes per session: Not on file  . Stress: Not on file  Relationships  . Social connections:    Talks on phone: Not on file    Gets together: Not on file    Attends religious service: Not on file    Active member of club or organization: Not on file    Attends meetings of clubs or organizations: Not on file     Relationship status: Not on file  . Intimate partner violence:    Fear of current or ex partner: Not on file    Emotionally abused: Not on file    Physically abused: Not on file    Forced sexual activity: Not on file  Other Topics Concern  . Not on file  Social History Narrative  . Not on file   Review of Systems Has a low ringing-- "like low air pressure"    Objective:   Physical Exam  Constitutional: He appears well-developed. No distress.  Neck: No thyromegaly present.  Cardiovascular: Normal rate, regular rhythm and normal heart sounds. Exam reveals no gallop.  No murmur heard. Respiratory: Effort normal and breath sounds normal. No respiratory distress. He has no wheezes. He has no rales.  Musculoskeletal: He exhibits no edema.  Lymphadenopathy:    He has no cervical adenopathy.           Assessment & Plan:

## 2018-06-30 DIAGNOSIS — T84098A Other mechanical complication of other internal joint prosthesis, initial encounter: Secondary | ICD-10-CM | POA: Diagnosis not present

## 2018-06-30 DIAGNOSIS — Z87891 Personal history of nicotine dependence: Secondary | ICD-10-CM | POA: Diagnosis not present

## 2018-06-30 DIAGNOSIS — M109 Gout, unspecified: Secondary | ICD-10-CM | POA: Diagnosis not present

## 2018-06-30 DIAGNOSIS — K219 Gastro-esophageal reflux disease without esophagitis: Secondary | ICD-10-CM | POA: Diagnosis not present

## 2018-06-30 DIAGNOSIS — Z885 Allergy status to narcotic agent status: Secondary | ICD-10-CM | POA: Diagnosis not present

## 2018-06-30 DIAGNOSIS — Z79899 Other long term (current) drug therapy: Secondary | ICD-10-CM | POA: Diagnosis not present

## 2018-06-30 DIAGNOSIS — E669 Obesity, unspecified: Secondary | ICD-10-CM | POA: Diagnosis not present

## 2018-06-30 DIAGNOSIS — M25512 Pain in left shoulder: Secondary | ICD-10-CM | POA: Diagnosis not present

## 2018-06-30 DIAGNOSIS — M19012 Primary osteoarthritis, left shoulder: Secondary | ICD-10-CM | POA: Diagnosis not present

## 2018-06-30 DIAGNOSIS — Z471 Aftercare following joint replacement surgery: Secondary | ICD-10-CM | POA: Diagnosis not present

## 2018-06-30 DIAGNOSIS — G8918 Other acute postprocedural pain: Secondary | ICD-10-CM | POA: Diagnosis not present

## 2018-06-30 DIAGNOSIS — Z96612 Presence of left artificial shoulder joint: Secondary | ICD-10-CM | POA: Diagnosis not present

## 2018-06-30 DIAGNOSIS — Z6835 Body mass index (BMI) 35.0-35.9, adult: Secondary | ICD-10-CM | POA: Diagnosis not present

## 2018-06-30 DIAGNOSIS — Z96653 Presence of artificial knee joint, bilateral: Secondary | ICD-10-CM | POA: Diagnosis not present

## 2018-06-30 DIAGNOSIS — I10 Essential (primary) hypertension: Secondary | ICD-10-CM | POA: Diagnosis not present

## 2018-06-30 DIAGNOSIS — T84038A Mechanical loosening of other internal prosthetic joint, initial encounter: Secondary | ICD-10-CM | POA: Diagnosis not present

## 2018-07-04 ENCOUNTER — Other Ambulatory Visit: Payer: Self-pay | Admitting: Internal Medicine

## 2018-07-05 DIAGNOSIS — M6281 Muscle weakness (generalized): Secondary | ICD-10-CM | POA: Diagnosis not present

## 2018-07-05 DIAGNOSIS — M25612 Stiffness of left shoulder, not elsewhere classified: Secondary | ICD-10-CM | POA: Diagnosis not present

## 2018-07-05 DIAGNOSIS — M25512 Pain in left shoulder: Secondary | ICD-10-CM | POA: Diagnosis not present

## 2018-07-06 DIAGNOSIS — M25512 Pain in left shoulder: Secondary | ICD-10-CM | POA: Diagnosis not present

## 2018-07-17 ENCOUNTER — Telehealth: Payer: Self-pay

## 2018-07-17 ENCOUNTER — Encounter: Payer: Self-pay | Admitting: Family Medicine

## 2018-07-17 ENCOUNTER — Ambulatory Visit (INDEPENDENT_AMBULATORY_CARE_PROVIDER_SITE_OTHER): Payer: Medicare Other | Admitting: Family Medicine

## 2018-07-17 VITALS — BP 120/80 | HR 71 | Temp 98.7°F | Ht 73.0 in | Wt 268.2 lb

## 2018-07-17 DIAGNOSIS — J22 Unspecified acute lower respiratory infection: Secondary | ICD-10-CM | POA: Diagnosis not present

## 2018-07-17 MED ORDER — AZITHROMYCIN 250 MG PO TABS: ORAL_TABLET | ORAL | 0 refills | Status: DC

## 2018-07-17 MED ORDER — BENZONATATE 100 MG PO CAPS: 100.0000 mg | ORAL_CAPSULE | Freq: Three times a day (TID) | ORAL | 0 refills | Status: DC | PRN

## 2018-07-17 MED ORDER — HYDROCODONE-HOMATROPINE 5-1.5 MG/5ML PO SYRP: 5.0000 mL | ORAL_SOLUTION | Freq: Every evening | ORAL | 0 refills | Status: DC | PRN

## 2018-07-17 NOTE — Progress Notes (Signed)
BP 120/80 (BP Location: Left Arm, Patient Position: Sitting, Cuff Size: Large)   Pulse 71   Temp 98.7 F (37.1 C) (Oral)   Ht 6\' 1"  (1.854 m)   Wt 268 lb 4 oz (121.7 kg)   SpO2 97%   BMI 35.39 kg/m    CC: sinus congestion Subjective:    Patient ID: Sean Garcia, male    DOB: 07-05-1951, 67 y.o.   MRN: [id number]  HPI: UGONNA KEIRSEY is a 67 y.o. male presenting on 07/17/2018 for Sinus Problem (C/o cough, nasal congestion, drainage- green and tooth pain. Tried Coricidin cold med. )   5-6d h/o sinus congestion, drainage, non productive cough. Bringing up colored mucous when blowing nose. Cough and congestion affects sleep. Feverish/chills 2 nights ago. Some intermittent tooth pain. Having coughing fits.   No ST, ear pain, dyspnea or wheezing.   Treating at home with corcedin with only temporary benefit.  No h/o asthma or COPD. No smokers at home  + sick contacts at home - children and grandchildren and wife  Recent L shoulder replacement 06/30/2018 current in brace/sling. Currently using oxycodone PRN.      Relevant past medical, surgical, family and social history reviewed and updated as indicated. Interim medical history since our last visit reviewed. Allergies and medications reviewed and updated. Outpatient Medications Prior to Visit  Medication Sig Dispense Refill  . amLODipine (NORVASC) 5 MG tablet Take 1 tablet (5 mg total) by mouth daily. 90 tablet 3  . atorvastatin (LIPITOR) 20 MG tablet TAKE 1 TABLET(20 MG) BY MOUTH DAILY 90 tablet 30  . bisoprolol (ZEBETA) 10 MG tablet Take 1 tablet (10 mg total) by mouth daily. 90 tablet 3  . COLCRYS 0.6 MG tablet TAKE 1 TABLET BY MOUTH TWICE A DAY 60 tablet 0  . desoximetasone (TOPICORT) 0.25 % cream APPLY EXTERNALLY TO THE AFFECTED AREA TWICE DAILY AS NEEDED FOR RASH 60 g 0  . fluocinolone (SYNALAR) 0.01 % external solution APP ON THE SKIN D PRN  3  . lisinopril (PRINIVIL,ZESTRIL) 40 MG tablet TAKE 1 TABLET(40 MG) BY MOUTH  DAILY 90 tablet 3  . vardenafil (LEVITRA) 20 MG tablet Take 1 tablet (20 mg total) by mouth daily as needed. 10 tablet 11   Facility-Administered Medications Prior to Visit  Medication Dose Route Frequency Provider Last Rate Last Dose  . 0.9 %  sodium chloride infusion  500 mL Intravenous Once Ladene Artist, MD         Per HPI unless specifically indicated in ROS section below Review of Systems Objective:    BP 120/80 (BP Location: Left Arm, Patient Position: Sitting, Cuff Size: Large)   Pulse 71   Temp 98.7 F (37.1 C) (Oral)   Ht 6\' 1"  (1.854 m)   Wt 268 lb 4 oz (121.7 kg)   SpO2 97%   BMI 35.39 kg/m   Wt Readings from Last 3 Encounters:  07/17/18 268 lb 4 oz (121.7 kg)  06/12/18 271 lb (122.9 kg)  06/09/18 265 lb (120.2 kg)    Physical Exam Vitals signs and nursing note reviewed.  Constitutional:      General: He is not in acute distress.    Appearance: He is well-developed.  HENT:     Head: Normocephalic and atraumatic.     Right Ear: Hearing, tympanic membrane, ear canal and external ear normal.     Left Ear: Hearing, tympanic membrane, ear canal and external ear normal.     Nose: Mucosal edema (  nasal mucosal congestion/erythema) and rhinorrhea present.     Right Sinus: No maxillary sinus tenderness or frontal sinus tenderness.     Left Sinus: No maxillary sinus tenderness or frontal sinus tenderness.     Mouth/Throat:     Pharynx: Uvula midline. No oropharyngeal exudate or posterior oropharyngeal erythema.     Tonsils: No tonsillar abscesses.  Eyes:     General: No scleral icterus.    Conjunctiva/sclera: Conjunctivae normal.     Pupils: Pupils are equal, round, and reactive to light.  Neck:     Musculoskeletal: Normal range of motion and neck supple.  Cardiovascular:     Rate and Rhythm: Normal rate and regular rhythm.     Heart sounds: Normal heart sounds. No murmur.  Pulmonary:     Effort: Pulmonary effort is normal. No respiratory distress.     Breath  sounds: Normal breath sounds. No wheezing, rhonchi or rales.     Comments: Deep cough present but lungs largely clear Lymphadenopathy:     Cervical: No cervical adenopathy.  Skin:    General: Skin is warm and dry.     Findings: No rash.       Assessment & Plan:   Problem List Items Addressed This Visit    Acute respiratory infection - Primary    Anticipate viral bronchitis and possibly developing sinusitis. Supportive care as per instructions. WASP for zpack with indications when to fill provided to patient. Hycodan cough syrup. Discussed guaifenesin use. Update if not improving with treatment  Codeine allergy. He is currently on oxycodone for shoulder pain, using very intermittently. Discussed hycodan QHS PRN to facilitate sleep, aware not to mix with oxycodone.       Relevant Medications   azithromycin (ZITHROMAX) 250 MG tablet       Meds ordered this encounter  Medications  . azithromycin (ZITHROMAX) 250 MG tablet    Sig: Take two tablets on day one followed by one tablet on days 2-5    Dispense:  6 each    Refill:  0  . HYDROcodone-homatropine (HYCODAN) 5-1.5 MG/5ML syrup    Sig: Take 5 mLs by mouth at bedtime as needed for cough (sedation precautions).    Dispense:  100 mL    Refill:  0   No orders of the defined types were placed in this encounter.  Patient Instructions  You have a respiratory infection, likely viral.  Continue corcedin. Take plain mucinex (guaifenesin) with large glass of water to help mobilize mucous.  Push fluids and plenty of rest. Try hycodan cough syrup for night time, don't mix with oxycodone.  If fever >101, or worsening productive cough or ongoing symptoms past 7-10 days, fill zpack antibiotic printed out today.  Call clinic with questions.  Good to see you today. I hope you start feeling better soon.     Follow up plan: Return if symptoms worsen or fail to improve.  Ria Bush, MD

## 2018-07-17 NOTE — Patient Instructions (Addendum)
You have a respiratory infection, likely viral.  Continue corcedin. Take plain mucinex (guaifenesin) with large glass of water to help mobilize mucous.  Push fluids and plenty of rest. Try hycodan cough syrup for night time, don't mix with oxycodone.  If fever >101, or worsening productive cough or ongoing symptoms past 7-10 days, fill zpack antibiotic printed out today.  Call clinic with questions.  Good to see you today. I hope you start feeling better soon.

## 2018-07-17 NOTE — Telephone Encounter (Signed)
Pt calls and said pts wife picked up hydrocodone homatropine and was advised by pharmacist that taking this med could cause rash and also could effect air way and shut airway down. Pt would like a different cough med sent to [facility] that will suppress the cough and help pt to rest. Pt did say that Dr Darnell Level advised not to take cough med within 6 hours of taking the pain med hydrocodone. Pt is still concerned about shutting his airway down. Pt request cb.

## 2018-07-17 NOTE — Telephone Encounter (Signed)
We spoke about this at [place]. He's already taken and oxycodone well. That's fine - doesn't have to take hycodan.  May take tessalon perls - swallow don't chew.  May use this during day and/or at night time to help suppress cough. Called and spoke with patient.

## 2018-07-17 NOTE — Assessment & Plan Note (Addendum)
Anticipate viral bronchitis and possibly developing sinusitis. Supportive care as per instructions. WASP for zpack with indications when to fill provided to patient. Hycodan cough syrup. Discussed guaifenesin use. Update if not improving with treatment  Codeine allergy. He is currently on oxycodone for shoulder pain, using very intermittently. Discussed hycodan QHS PRN to facilitate sleep, aware not to mix with oxycodone.

## 2018-07-25 DIAGNOSIS — M6281 Muscle weakness (generalized): Secondary | ICD-10-CM | POA: Diagnosis not present

## 2018-07-25 DIAGNOSIS — M25612 Stiffness of left shoulder, not elsewhere classified: Secondary | ICD-10-CM | POA: Diagnosis not present

## 2018-07-25 DIAGNOSIS — M25512 Pain in left shoulder: Secondary | ICD-10-CM | POA: Diagnosis not present

## 2018-07-27 DIAGNOSIS — M25512 Pain in left shoulder: Secondary | ICD-10-CM | POA: Diagnosis not present

## 2018-08-01 DIAGNOSIS — M6281 Muscle weakness (generalized): Secondary | ICD-10-CM | POA: Diagnosis not present

## 2018-08-01 DIAGNOSIS — M25612 Stiffness of left shoulder, not elsewhere classified: Secondary | ICD-10-CM | POA: Diagnosis not present

## 2018-08-01 DIAGNOSIS — M25512 Pain in left shoulder: Secondary | ICD-10-CM | POA: Diagnosis not present

## 2018-08-03 DIAGNOSIS — M6281 Muscle weakness (generalized): Secondary | ICD-10-CM | POA: Diagnosis not present

## 2018-08-03 DIAGNOSIS — M25612 Stiffness of left shoulder, not elsewhere classified: Secondary | ICD-10-CM | POA: Diagnosis not present

## 2018-08-03 DIAGNOSIS — M25512 Pain in left shoulder: Secondary | ICD-10-CM | POA: Diagnosis not present

## 2018-08-08 DIAGNOSIS — M6281 Muscle weakness (generalized): Secondary | ICD-10-CM | POA: Diagnosis not present

## 2018-08-08 DIAGNOSIS — M25512 Pain in left shoulder: Secondary | ICD-10-CM | POA: Diagnosis not present

## 2018-08-08 DIAGNOSIS — M25612 Stiffness of left shoulder, not elsewhere classified: Secondary | ICD-10-CM | POA: Diagnosis not present

## 2018-08-10 DIAGNOSIS — M25612 Stiffness of left shoulder, not elsewhere classified: Secondary | ICD-10-CM | POA: Diagnosis not present

## 2018-08-10 DIAGNOSIS — M6281 Muscle weakness (generalized): Secondary | ICD-10-CM | POA: Diagnosis not present

## 2018-08-10 DIAGNOSIS — M25512 Pain in left shoulder: Secondary | ICD-10-CM | POA: Diagnosis not present

## 2018-08-14 DIAGNOSIS — M25512 Pain in left shoulder: Secondary | ICD-10-CM | POA: Diagnosis not present

## 2018-08-14 DIAGNOSIS — M25612 Stiffness of left shoulder, not elsewhere classified: Secondary | ICD-10-CM | POA: Diagnosis not present

## 2018-08-14 DIAGNOSIS — M6281 Muscle weakness (generalized): Secondary | ICD-10-CM | POA: Diagnosis not present

## 2018-08-17 DIAGNOSIS — M25612 Stiffness of left shoulder, not elsewhere classified: Secondary | ICD-10-CM | POA: Diagnosis not present

## 2018-08-17 DIAGNOSIS — M25512 Pain in left shoulder: Secondary | ICD-10-CM | POA: Diagnosis not present

## 2018-08-17 DIAGNOSIS — M6281 Muscle weakness (generalized): Secondary | ICD-10-CM | POA: Diagnosis not present

## 2018-08-22 DIAGNOSIS — M25512 Pain in left shoulder: Secondary | ICD-10-CM | POA: Diagnosis not present

## 2018-08-22 DIAGNOSIS — M6281 Muscle weakness (generalized): Secondary | ICD-10-CM | POA: Diagnosis not present

## 2018-08-22 DIAGNOSIS — M25612 Stiffness of left shoulder, not elsewhere classified: Secondary | ICD-10-CM | POA: Diagnosis not present

## 2018-09-04 DIAGNOSIS — M6281 Muscle weakness (generalized): Secondary | ICD-10-CM | POA: Diagnosis not present

## 2018-09-04 DIAGNOSIS — M25512 Pain in left shoulder: Secondary | ICD-10-CM | POA: Diagnosis not present

## 2018-09-04 DIAGNOSIS — M25612 Stiffness of left shoulder, not elsewhere classified: Secondary | ICD-10-CM | POA: Diagnosis not present

## 2018-09-12 DIAGNOSIS — M25512 Pain in left shoulder: Secondary | ICD-10-CM | POA: Diagnosis not present

## 2018-09-12 DIAGNOSIS — M6281 Muscle weakness (generalized): Secondary | ICD-10-CM | POA: Diagnosis not present

## 2018-09-12 DIAGNOSIS — M25612 Stiffness of left shoulder, not elsewhere classified: Secondary | ICD-10-CM | POA: Diagnosis not present

## 2018-09-17 ENCOUNTER — Other Ambulatory Visit: Payer: Self-pay | Admitting: Internal Medicine

## 2018-09-18 NOTE — Telephone Encounter (Signed)
Spoke to patient by telephone and was advised that he is taking the medication once a day. Patient stated that he does not need a refill at this time and will call back when he needs one.

## 2018-09-18 NOTE — Telephone Encounter (Signed)
Electronic refill request [facility] Last office visit 07/17/18 Last refill 07/04/18 #60 Upcoming appointment 01/17/2019

## 2018-09-18 NOTE — Telephone Encounter (Signed)
Is he still taking colchicine?  Based off of an office visit note from November 2019 Dr. Silvio Pate mentioned discontinuing.  If he is still taking, how often is he taking? Does he actually need a refill since this was an electronic request?

## 2018-11-14 ENCOUNTER — Ambulatory Visit (INDEPENDENT_AMBULATORY_CARE_PROVIDER_SITE_OTHER): Payer: Medicare Other | Admitting: Internal Medicine

## 2018-11-14 ENCOUNTER — Encounter: Payer: Self-pay | Admitting: Internal Medicine

## 2018-11-14 VITALS — Ht 73.0 in | Wt 250.0 lb

## 2018-11-14 DIAGNOSIS — S39012A Strain of muscle, fascia and tendon of lower back, initial encounter: Secondary | ICD-10-CM | POA: Diagnosis not present

## 2018-11-14 MED ORDER — TIZANIDINE HCL 2 MG PO TABS: 2.0000 mg | ORAL_TABLET | Freq: Three times a day (TID) | ORAL | 0 refills | Status: DC | PRN

## 2018-11-14 NOTE — Progress Notes (Signed)
Subjective:    Patient ID: Sean Garcia, male    DOB: 08-Aug-1950, 68 y.o.   MRN: [id number]  HPI Virtual visit due to low back injury Identification done Reviewed billing and he gave consent He is in his home and I am in my office Wife helped him with the camera  5 days ago, he helped his wife move a piece of furniture (recliner) Trying to protect his left shoulder that was recently replaced "Tweeked my back"--felt it right away 1-2 days later, he noted pain Using heating pad and tylenol---but still severe low right back pain (points near right costal margin/CVA area) May be comfortable if still---but if he moves a certain way, it really grabs him No radiation into legs  Current Outpatient Medications on File Prior to Visit  Medication Sig Dispense Refill  . amLODipine (NORVASC) 5 MG tablet Take 1 tablet (5 mg total) by mouth daily. 90 tablet 3  . atorvastatin (LIPITOR) 20 MG tablet TAKE 1 TABLET(20 MG) BY MOUTH DAILY 90 tablet 30  . bisoprolol (ZEBETA) 10 MG tablet Take 1 tablet (10 mg total) by mouth daily. 90 tablet 3  . COLCRYS 0.6 MG tablet TAKE 1 TABLET BY MOUTH TWICE A DAY 60 tablet 0  . desoximetasone (TOPICORT) 0.25 % cream APPLY EXTERNALLY TO THE AFFECTED AREA TWICE DAILY AS NEEDED FOR RASH 60 g 0  . fluocinolone (SYNALAR) 0.01 % external solution APP ON THE SKIN D PRN  3  . lisinopril (PRINIVIL,ZESTRIL) 40 MG tablet TAKE 1 TABLET(40 MG) BY MOUTH DAILY 90 tablet 3  . vardenafil (LEVITRA) 20 MG tablet Take 1 tablet (20 mg total) by mouth daily as needed. 10 tablet 11   No current facility-administered medications on file prior to visit.     Allergies  Allergen Reactions  . Codeine Itching and Rash    Past Medical History:  Diagnosis Date  . Cancer (HCC)    Basal cell  . ED (erectile dysfunction)   . Gout   . Hyperlipidemia   . Hypertension   . Obesity   . Osteoarthritis     Past Surgical History:  Procedure Laterality Date  . COLONOSCOPY  2009   benign polyp, rpt 10 yrs Fuller Plan)  . KNEE SURGERY     for bowlegs, age 28  . PATELLAR TENDON REPAIR  09/2008   left, Dr. Leilani Merl in [place]  . TOTAL KNEE ARTHROPLASTY  2007   bilateral  . TOTAL SHOULDER REPLACEMENT  05/2009   left     Family History  Problem Relation Age of Onset  . Hyperlipidemia Mother   . Cancer Mother        lymphoma  . Coronary artery disease Father   . Heart disease Father        CHF  . Colon cancer Neg Hx   . Colon polyps Neg Hx   . Esophageal cancer Neg Hx   . Stomach cancer Neg Hx   . Rectal cancer Neg Hx     Social History   Socioeconomic History  . Marital status: Married    Spouse name: Not on file  . Number of children: 2  . Years of education: Not on file  . Highest education level: Not on file  Occupational History  . Occupation: Landscape architect of [employer]    Employer: [employer]  . Financial resource strain: Not on file  . Food insecurity:    Worry: Not on file  Inability: Not on file  . Transportation needs:    Medical: Not on file    Non-medical: Not on file  Tobacco Use  . Smoking status: Former Research scientist (life sciences)  . Smokeless tobacco: Never Used  . Tobacco comment: Quit in his 65's.  Substance and Sexual Activity  . Alcohol use: No  . Drug use: Not Currently  . Sexual activity: Not on file  Lifestyle  . Physical activity:    Days per week: Not on file    Minutes per session: Not on file  . Stress: Not on file  Relationships  . Social connections:    Talks on phone: Not on file    Gets together: Not on file    Attends religious service: Not on file    Active member of club or organization: Not on file    Attends meetings of clubs or organizations: Not on file    Relationship status: Not on file  . Intimate partner violence:    Fear of current or ex partner: Not on file    Emotionally abused: Not on file    Physically abused: Not on file    Forced sexual activity: Not on file  Other  Topics Concern  . Not on file  Social History Narrative  . Not on file   Review of Systems No leg weakness No cough or SOB No urinary symptoms or hematuria---not a kidney stone    Objective:   Physical Exam  Constitutional: He appears well-developed. No distress.  Respiratory: Effort normal. No respiratory distress.  Musculoskeletal:     Comments: Fairly normal back flexion No apparent tenderness along spine  Neurological:  Normal gait Apparent normal strength in legs           Assessment & Plan:

## 2018-11-14 NOTE — Assessment & Plan Note (Signed)
Upper right lumbar area--from twisting and lifting Fairly classic for muscle injury---which is what he thought Nothing to suggest radiculitis Reassured he should heal on his own Continue tylenol and heat Can try aleve which works well for him--for a week or so Tizanidine for prn use--warned about driving, etc

## 2018-11-23 DIAGNOSIS — Z96612 Presence of left artificial shoulder joint: Secondary | ICD-10-CM | POA: Diagnosis not present

## 2018-11-23 DIAGNOSIS — M25512 Pain in left shoulder: Secondary | ICD-10-CM | POA: Diagnosis not present

## 2018-12-16 ENCOUNTER — Other Ambulatory Visit: Payer: Self-pay | Admitting: Internal Medicine

## 2019-01-14 ENCOUNTER — Other Ambulatory Visit: Payer: Self-pay | Admitting: Internal Medicine

## 2019-01-17 ENCOUNTER — Encounter: Payer: BLUE CROSS/BLUE SHIELD | Admitting: Internal Medicine

## 2019-01-22 ENCOUNTER — Other Ambulatory Visit: Payer: Self-pay | Admitting: Internal Medicine

## 2019-02-10 ENCOUNTER — Other Ambulatory Visit: Payer: Self-pay | Admitting: Internal Medicine

## 2019-02-14 NOTE — Telephone Encounter (Signed)
Sent. Thanks.   

## 2019-02-27 ENCOUNTER — Other Ambulatory Visit: Payer: Self-pay | Admitting: Family Medicine

## 2019-02-27 NOTE — Telephone Encounter (Signed)
Pharmacy sending request to change RX to 90 day supply is this ok? This was refilled by Dr. Damita Dunnings on 02/14/2019 for 30 day supply with a refill

## 2019-02-28 NOTE — Telephone Encounter (Signed)
Rx sent electronically.  

## 2019-02-28 NOTE — Telephone Encounter (Signed)
Okay for 90 day with 3 refills

## 2019-03-02 ENCOUNTER — Telehealth: Payer: Self-pay | Admitting: Internal Medicine

## 2019-03-02 NOTE — Telephone Encounter (Signed)
Left message on VM per PDR letting him know that Dr Silvio Pate says it does not matter where he gets it from as long as he gets it and usually in September or October.

## 2019-03-02 NOTE — Telephone Encounter (Signed)
Best number [phone]  Pt wanted to know if it was ok to get flu shot at pharmacy or if it was better to get in the office   Please advise

## 2019-04-12 DIAGNOSIS — Z23 Encounter for immunization: Secondary | ICD-10-CM | POA: Diagnosis not present

## 2019-05-01 ENCOUNTER — Other Ambulatory Visit: Payer: Self-pay | Admitting: Internal Medicine

## 2019-05-02 NOTE — Telephone Encounter (Signed)
Electronic refill request Atorvastatin Last refill 02/06/18 #90/3 Upcoming appointment with Dr. Silvio Pate 05/14/19 See drug working with Colchicine

## 2019-05-08 ENCOUNTER — Encounter: Payer: Medicare Other | Admitting: Internal Medicine

## 2019-05-14 ENCOUNTER — Other Ambulatory Visit: Payer: Self-pay

## 2019-05-14 ENCOUNTER — Encounter: Payer: Self-pay | Admitting: Internal Medicine

## 2019-05-14 ENCOUNTER — Ambulatory Visit (INDEPENDENT_AMBULATORY_CARE_PROVIDER_SITE_OTHER): Payer: Medicare Other | Admitting: Internal Medicine

## 2019-05-14 VITALS — BP 132/84 | HR 62 | Temp 98.2°F | Ht 72.75 in | Wt 269.0 lb

## 2019-05-14 DIAGNOSIS — M1000 Idiopathic gout, unspecified site: Secondary | ICD-10-CM

## 2019-05-14 DIAGNOSIS — I10 Essential (primary) hypertension: Secondary | ICD-10-CM

## 2019-05-14 DIAGNOSIS — M159 Polyosteoarthritis, unspecified: Secondary | ICD-10-CM | POA: Diagnosis not present

## 2019-05-14 DIAGNOSIS — Z7189 Other specified counseling: Secondary | ICD-10-CM

## 2019-05-14 DIAGNOSIS — Z6832 Body mass index (BMI) 32.0-32.9, adult: Secondary | ICD-10-CM

## 2019-05-14 DIAGNOSIS — E785 Hyperlipidemia, unspecified: Secondary | ICD-10-CM | POA: Diagnosis not present

## 2019-05-14 DIAGNOSIS — Z0000 Encounter for general adult medical examination without abnormal findings: Secondary | ICD-10-CM

## 2019-05-14 DIAGNOSIS — Z23 Encounter for immunization: Secondary | ICD-10-CM | POA: Diagnosis not present

## 2019-05-14 DIAGNOSIS — E6609 Other obesity due to excess calories: Secondary | ICD-10-CM | POA: Diagnosis not present

## 2019-05-14 LAB — COMPREHENSIVE METABOLIC PANEL
ALT: 21 U/L (ref 0–53)
AST: 31 U/L (ref 0–37)
Albumin: 4.1 g/dL (ref 3.5–5.2)
Alkaline Phosphatase: 99 U/L (ref 39–117)
BUN: 13 mg/dL (ref 6–23)
CO2: 28 mEq/L (ref 19–32)
Calcium: 9.9 mg/dL (ref 8.4–10.5)
Chloride: 106 mEq/L (ref 96–112)
Creatinine, Ser: 0.94 mg/dL (ref 0.40–1.50)
GFR: 80.22 mL/min (ref >60.00)
Glucose, Bld: 98 mg/dL (ref 70–99)
Potassium: 4.5 mEq/L (ref 3.5–5.1)
Sodium: 140 mEq/L (ref 135–145)
Total Bilirubin: 0.7 mg/dL (ref 0.2–1.2)
Total Protein: 6.7 g/dL (ref 6.0–8.3)

## 2019-05-14 LAB — CBC
HCT: 44.1 % (ref 39.0–52.0)
Hemoglobin: 14.6 g/dL (ref 13.0–17.0)
MCHC: 33.2 g/dL (ref 30.0–36.0)
MCV: 87.1 fl (ref 78.0–100.0)
Platelets: 194 10*3/uL (ref 150.0–400.0)
RBC: 5.06 Mil/uL (ref 4.22–5.81)
RDW: 13.8 % (ref 11.5–15.5)
WBC: 8.1 10*3/uL (ref 4.0–10.5)

## 2019-05-14 LAB — LIPID PANEL
Cholesterol: 137 mg/dL (ref 0–200)
HDL: 37.3 mg/dL — ABNORMAL LOW (ref >39.00)
LDL Cholesterol: 82 mg/dL (ref 0–99)
NonHDL: 99.31
Total CHOL/HDL Ratio: 4
Triglycerides: 89 mg/dL (ref 0.0–149.0)
VLDL: 17.8 mg/dL (ref 0.0–40.0)

## 2019-05-14 NOTE — Assessment & Plan Note (Signed)
Mostly hands Tylenol works

## 2019-05-14 NOTE — Assessment & Plan Note (Signed)
I have personally reviewed the Medicare Annual Wellness questionnaire and have noted 1. The patient's medical and social history 2. Their use of alcohol, tobacco or illicit drugs 3. Their current medications and supplements 4. The patient's functional ability including ADL's, fall risks, home safety risks and hearing or visual             impairment. 5. Diet and physical activities 6. Evidence for depression or mood disorders  The patients weight, height, BMI and visual acuity have been recorded in the chart I have made referrals, counseling and provided education to the patient based review of the above and I have provided the pt with a written personalized care plan for preventive services.  I have provided you with a copy of your personalized plan for preventive services. Please take the time to review along with your updated medication list.  Colon due again in 2024 Will defer PSA to next year Discussed fitness Got shingrix #1---#2 in December Had flu vaccine prevnar today

## 2019-05-14 NOTE — Assessment & Plan Note (Signed)
See social history 

## 2019-05-14 NOTE — Progress Notes (Signed)
Subjective:    Patient ID: Sean Garcia, male    DOB: Apr 24, 1951, 68 y.o.   MRN: [id number]  HPI Here for initial Medicare wellness visit and follow up of chronic health condtions Reviewed advanced directives Reviewed other doctors---Dr Emmaline Kluver dental [facility] Donning), Dr Shelda Altes doctor, [facility] dermatology practice Did have left shoulder replacement 12/19. Doing okay with this. 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 amLODipine (NORVASC) 5 MG tablet Take 1 tablet (5 mg total) by mouth daily. 90 tablet 3  . atorvastatin (LIPITOR) 20 MG tablet TAKE 1 TABLET BY MOUTH EVERY DAY 90 tablet 0  . bisoprolol (ZEBETA) 10 MG tablet TAKE 1 TABLET BY MOUTH EVERY DAY 90 tablet 0  . colchicine 0.6 MG tablet TAKE 1 TABLET (0.6 MG TOTAL) BY MOUTH 2 (TWO) TIMES DAILY AS NEEDED. 180 tablet 1  . desoximetasone (TOPICORT) 0.25 % cream APPLY EXTERNALLY TO THE AFFECTED AREA TWICE DAILY AS NEEDED FOR RASH 60 g 0  . fluocinolone (SYNALAR) 0.01 % external solution APP ON THE SKIN D PRN  3  . lisinopril (ZESTRIL) 40 MG tablet TAKE 1 TABLET  BY MOUTH EVERY DAY 90 tablet 3  . vardenafil (LEVITRA) 20 MG tablet Take 1 tablet (20 mg total) by mouth daily as needed. 10 tablet 11   No current facility-administered medications on file prior to visit.     Allergies  Allergen Reactions  . Codeine Itching and Rash    Past Medical History:  Diagnosis Date  . Cancer (HCC)    Basal cell  . ED (erectile dysfunction)   . Gout   . Hyperlipidemia   . Hypertension   . Obesity   . Osteoarthritis     Past Surgical History:  Procedure Laterality Date  . COLONOSCOPY  2009   benign polyp, rpt 10 yrs Fuller Plan)  . KNEE SURGERY     for bowlegs, age 45  . PATELLAR TENDON REPAIR  09/2008   left, Dr. Leilani Merl in [place]  . TOTAL KNEE ARTHROPLASTY  2007   bilateral  . TOTAL SHOULDER REPLACEMENT  05/2009   left     Family History  Problem Relation Age of Onset  . Hyperlipidemia Mother   . Cancer Mother        lymphoma  . Coronary artery disease Father   . Heart disease Father        CHF  . Colon cancer Neg Hx   . Colon polyps Neg Hx   . Esophageal cancer Neg Hx   . Stomach cancer Neg Hx   . Rectal cancer Neg Hx     Social History   Socioeconomic  History  . Marital status: Married    Spouse name: Not on file  . Number of children: 2  . Years of education: Not on file  . Highest education level: Not on file  Occupational History  . Occupation: Landscape architect of [employer]    Employer: [employer]  . Financial resource strain: Not on file  . Food insecurity    Worry: Not on file    Inability: Not on file  . Transportation needs    Medical: Not on file    Non-medical: Not on file  Tobacco Use  . Smoking status: Former Research scientist (life sciences)  . Smokeless tobacco: Never Used  . Tobacco comment: Quit in his 53's.  Substance and Sexual Activity  . Alcohol use: No  . Drug use: Not Currently  . Sexual activity: Not on file  Lifestyle  . Physical activity    Days per week: Not on file    Minutes  per session: Not on file  . Stress: Not on file  Relationships  . Social Herbalist on phone: Not on file    Gets together: Not on file    Attends religious service: Not on file    Active member of club or organization: Not on file    Attends meetings of clubs or organizations: Not on file    Relationship status: Not on file  . Intimate partner violence    Fear of current or ex partner: Not on file    Emotionally abused: Not on file    Physically abused: Not on file    Forced sexual activity: Not on file  Other Topics Concern  . Not on file  Social History Narrative   Has living will   Wife, then daughter Lenna Sciara, would be health care POA   Would accept resuscitation   No long term life support (like ventilator or tube feeds)   Review of Systems Appetite is fine Weight fairly stable Usually sleeps okay--occasionally up at night (no daytime issues) Wears seat belt Voids okay---stream seems fine Rarely uses the vardenafil--but it still works Bowels are fine--no blood Rare heartburn---will take a pepcid. No dysphagia No other sig joint pains    Objective:   Physical Exam  Constitutional: He is oriented to person, place, and time. He appears well-developed. No distress.  HENT:  Mouth/Throat: Oropharynx is clear and moist. No oropharyngeal exudate.  No oral lesions  Neck: No thyromegaly present.  Cardiovascular: Normal rate, regular rhythm, normal heart sounds and intact distal pulses. Exam reveals no gallop.  No murmur heard. Respiratory: Effort normal and breath sounds normal. No respiratory distress. He has no wheezes. He has no rales.  GI: Soft. There is no abdominal tenderness.  Musculoskeletal:        General: No tenderness or edema.  Lymphadenopathy:    He has no cervical adenopathy.  Neurological: He is alert and oriented to person, place, and time.  President--- "Eber Jones Trump, [address] [place]" [phone] D-l-r-o-w Recall  3/3  Skin: No rash noted. No erythema.  Psychiatric: He has a normal mood and affect. His behavior is normal.           Assessment & Plan:

## 2019-05-14 NOTE — Assessment & Plan Note (Signed)
Uses the colchicine prn 

## 2019-05-14 NOTE — Assessment & Plan Note (Signed)
BP Readings from Last 3 Encounters:  05/14/19 132/84  07/17/18 120/80  06/12/18 112/88   Good control

## 2019-05-14 NOTE — Patient Instructions (Signed)

## 2019-05-14 NOTE — Progress Notes (Signed)
Hearing Screening   Method: Audiometry   125Hz 250Hz 500Hz 1000Hz 2000Hz 3000Hz 4000Hz 6000Hz 8000Hz  Right ear:   20 20 20  20    Left ear:   20 20 20  20      Visual Acuity Screening   Right eye Left eye Both eyes  Without correction:     With correction: 20/20 20/20 20/15    

## 2019-05-14 NOTE — Assessment & Plan Note (Signed)
Phentermine didn't really help Discussed liraglutide--he doesn't want shots Counseled on watching intake

## 2019-05-14 NOTE — Assessment & Plan Note (Signed)
No problems with primary prevention 

## 2019-05-14 NOTE — Addendum Note (Signed)
Addended by: Pilar Grammes on: 05/14/2019 04:43 PM   Modules accepted: Orders

## 2019-05-24 DIAGNOSIS — M25512 Pain in left shoulder: Secondary | ICD-10-CM | POA: Diagnosis not present

## 2019-05-27 ENCOUNTER — Other Ambulatory Visit: Payer: Self-pay | Admitting: Internal Medicine

## 2019-07-05 ENCOUNTER — Other Ambulatory Visit: Payer: Self-pay | Admitting: Internal Medicine

## 2019-07-13 ENCOUNTER — Other Ambulatory Visit: Payer: Self-pay | Admitting: Internal Medicine

## 2019-07-23 DIAGNOSIS — L280 Lichen simplex chronicus: Secondary | ICD-10-CM | POA: Diagnosis not present

## 2019-07-23 DIAGNOSIS — L218 Other seborrheic dermatitis: Secondary | ICD-10-CM | POA: Diagnosis not present

## 2019-07-23 DIAGNOSIS — L570 Actinic keratosis: Secondary | ICD-10-CM | POA: Diagnosis not present

## 2019-09-28 ENCOUNTER — Telehealth: Payer: Self-pay | Admitting: Internal Medicine

## 2019-09-28 MED ORDER — VARDENAFIL HCL 20 MG PO TABS: 20.0000 mg | ORAL_TABLET | Freq: Every day | ORAL | 11 refills | Status: DC | PRN

## 2019-09-28 NOTE — Telephone Encounter (Signed)
Spoke to pt. Suggested he download the GoodRx app to his phone so he can have a card. [facility]

## 2019-09-28 NOTE — Telephone Encounter (Signed)
Patient contacted [facility] for a refill on generic Levitra 20 mg.  Patient said he's going out of town this weekend and is requesting a refill sent in before tomorrow.  Patient said pharmacy faxed request to our office yesterday.  Patient wanted to know if we have any discount cards for the medication.

## 2019-11-07 DIAGNOSIS — H2513 Age-related nuclear cataract, bilateral: Secondary | ICD-10-CM | POA: Diagnosis not present

## 2019-11-17 ENCOUNTER — Other Ambulatory Visit: Payer: Self-pay | Admitting: Internal Medicine

## 2019-12-03 DIAGNOSIS — M25512 Pain in left shoulder: Secondary | ICD-10-CM | POA: Diagnosis not present

## 2019-12-26 ENCOUNTER — Other Ambulatory Visit: Payer: Self-pay | Admitting: Internal Medicine

## 2019-12-28 ENCOUNTER — Other Ambulatory Visit: Payer: Self-pay | Admitting: Internal Medicine

## 2020-05-14 ENCOUNTER — Encounter: Payer: Medicare Other | Admitting: Internal Medicine

## 2020-06-17 ENCOUNTER — Other Ambulatory Visit: Payer: Self-pay | Admitting: Internal Medicine

## 2020-06-19 ENCOUNTER — Encounter: Payer: Self-pay | Admitting: Internal Medicine

## 2020-06-19 ENCOUNTER — Ambulatory Visit (INDEPENDENT_AMBULATORY_CARE_PROVIDER_SITE_OTHER): Payer: Medicare Other | Admitting: Internal Medicine

## 2020-06-19 ENCOUNTER — Other Ambulatory Visit: Payer: Self-pay

## 2020-06-19 VITALS — BP 114/84 | HR 66 | Temp 98.1°F | Ht 72.25 in | Wt 268.0 lb

## 2020-06-19 DIAGNOSIS — I10 Essential (primary) hypertension: Secondary | ICD-10-CM

## 2020-06-19 DIAGNOSIS — Z0000 Encounter for general adult medical examination without abnormal findings: Secondary | ICD-10-CM

## 2020-06-19 DIAGNOSIS — Z125 Encounter for screening for malignant neoplasm of prostate: Secondary | ICD-10-CM

## 2020-06-19 DIAGNOSIS — E785 Hyperlipidemia, unspecified: Secondary | ICD-10-CM

## 2020-06-19 DIAGNOSIS — M1000 Idiopathic gout, unspecified site: Secondary | ICD-10-CM

## 2020-06-19 DIAGNOSIS — Z23 Encounter for immunization: Secondary | ICD-10-CM

## 2020-06-19 DIAGNOSIS — Z7189 Other specified counseling: Secondary | ICD-10-CM

## 2020-06-19 NOTE — Assessment & Plan Note (Signed)
Uses the colchicine as prophylaxis

## 2020-06-19 NOTE — Patient Instructions (Signed)
You can try over the counter diclofenac gel on your left shoulder

## 2020-06-19 NOTE — Assessment & Plan Note (Signed)
BP Readings from Last 3 Encounters:  06/19/20 114/84  05/14/19 132/84  07/17/18 120/80   Good control on amlodipine, lisinopril and bisoprolol

## 2020-06-19 NOTE — Addendum Note (Signed)
Addended by: Pilar Grammes on: 06/19/2020 04:05 PM   Modules accepted: Orders

## 2020-06-19 NOTE — Assessment & Plan Note (Signed)
Doing well with primary prevention

## 2020-06-19 NOTE — Assessment & Plan Note (Signed)
I have personally reviewed the Medicare Annual Wellness questionnaire and have noted 1. The patient's medical and social history 2. Their use of alcohol, tobacco or illicit drugs 3. Their current medications and supplements 4. The patient's functional ability including ADL's, fall risks, home safety risks and hearing or visual             impairment. 5. Diet and physical activities 6. Evidence for depression or mood disorders  The patients weight, height, BMI and visual acuity have been recorded in the chart I have made referrals, counseling and provided education to the patient based review of the above and I have provided the pt with a written personalized care plan for preventive services.  I have provided you with a copy of your personalized plan for preventive services. Please take the time to review along with your updated medication list.  Colon due 2024 (may be 2026 with new guidelines) Will check PSA after discussion Urged him to consider COVID vaccine Pneumovax and flu vaccine today Discussed increasing exercise--aerobic and resistance
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
## 2020-06-19 NOTE — Progress Notes (Signed)
Hearing Screening   Method: Audiometry   125Hz  250Hz  500Hz  1000Hz  2000Hz  3000Hz  4000Hz  6000Hz  8000Hz   Right ear:   20 20 20  20     Left ear:   20 20 20  20     Vision Screening Comments: April 2021

## 2020-06-19 NOTE — Assessment & Plan Note (Signed)
See social history 

## 2020-06-20 ENCOUNTER — Other Ambulatory Visit: Payer: Self-pay | Admitting: Internal Medicine

## 2020-06-20 LAB — CBC
HCT: 46.3 % (ref 39.0–52.0)
Hemoglobin: 15.5 g/dL (ref 13.0–17.0)
MCHC: 33.4 g/dL (ref 30.0–36.0)
MCV: 88.2 fl (ref 78.0–100.0)
Platelets: 215 10*3/uL (ref 150.0–400.0)
RBC: 5.25 Mil/uL (ref 4.22–5.81)
RDW: 13.8 % (ref 11.5–15.5)
WBC: 9.8 10*3/uL (ref 4.0–10.5)

## 2020-06-20 LAB — COMPREHENSIVE METABOLIC PANEL
ALT: 17 U/L (ref 0–53)
AST: 20 U/L (ref 0–37)
Albumin: 4.3 g/dL (ref 3.5–5.2)
Alkaline Phosphatase: 101 U/L (ref 39–117)
BUN: 12 mg/dL (ref 6–23)
CO2: 29 mEq/L (ref 19–32)
Calcium: 10.4 mg/dL (ref 8.4–10.5)
Chloride: 103 mEq/L (ref 96–112)
Creatinine, Ser: 1.03 mg/dL (ref 0.40–1.50)
GFR: 75.22 mL/min (ref >60.00)
Glucose, Bld: 90 mg/dL (ref 70–99)
Potassium: 5 mEq/L (ref 3.5–5.1)
Sodium: 140 mEq/L (ref 135–145)
Total Bilirubin: 0.7 mg/dL (ref 0.2–1.2)
Total Protein: 7 g/dL (ref 6.0–8.3)

## 2020-06-20 LAB — PSA, MEDICARE: PSA: 0.44 ng/ml (ref 0.10–4.00)

## 2020-06-20 LAB — LIPID PANEL
Cholesterol: 138 mg/dL (ref 0–200)
HDL: 32.3 mg/dL — ABNORMAL LOW (ref >39.00)
LDL Cholesterol: 85 mg/dL (ref 0–99)
NonHDL: 105.23
Total CHOL/HDL Ratio: 4
Triglycerides: 102 mg/dL (ref 0.0–149.0)
VLDL: 20.4 mg/dL (ref 0.0–40.0)

## 2020-07-23 ENCOUNTER — Other Ambulatory Visit: Payer: Self-pay | Admitting: Internal Medicine

## 2020-08-09 ENCOUNTER — Telehealth (INDEPENDENT_AMBULATORY_CARE_PROVIDER_SITE_OTHER): Payer: Medicare Other | Admitting: Internal Medicine

## 2020-08-09 ENCOUNTER — Other Ambulatory Visit: Payer: Self-pay

## 2020-08-09 ENCOUNTER — Encounter: Payer: Self-pay | Admitting: Internal Medicine

## 2020-08-09 DIAGNOSIS — B9789 Other viral agents as the cause of diseases classified elsewhere: Secondary | ICD-10-CM | POA: Diagnosis not present

## 2020-08-09 DIAGNOSIS — J988 Other specified respiratory disorders: Secondary | ICD-10-CM | POA: Diagnosis not present

## 2020-08-09 MED ORDER — BENZONATATE 200 MG PO CAPS: 200.0000 mg | ORAL_CAPSULE | Freq: Three times a day (TID) | ORAL | 0 refills | Status: DC | PRN

## 2020-08-09 NOTE — Progress Notes (Signed)
Subjective:    Patient ID: Sean Garcia, male    DOB: 01/01/1951, 70 y.o.   MRN: [id number]  HPI Video virtual visit for persistent respiratory symptoms Identification done Reviewed billing and limitations and he gave consent Participants----patient in his home and I am in my office  Started with symptoms 1/8----woke with sore throat and earache Persisted for the day--then eased off Then got tightness in his head and some chest tightness with dry persistent cough Minor fatigue Very slight feeling of SOB--when doing something Did have some sweats at night Feeling of losing voice tends to progress as the day goes on (and fatigue) May have fever at night--not persistent  Taking coricidin flu--not much help  Wife has head cold Not immunized for COVID  Current Outpatient Medications on File Prior to Visit  Medication Sig Dispense Refill  . amLODipine (NORVASC) 5 MG tablet TAKE 1 TABLET BY MOUTH EVERY DAY 90 tablet 3  . atorvastatin (LIPITOR) 20 MG tablet TAKE 1 TABLET BY MOUTH EVERY DAY 90 tablet 3  . bisoprolol (ZEBETA) 10 MG tablet TAKE 1 TABLET BY MOUTH EVERY DAY 90 tablet 3  . colchicine 0.6 MG tablet TAKE 1 TABLET (0.6 MG TOTAL) BY MOUTH 2 (TWO) TIMES DAILY AS NEEDED. 180 tablet 1  . desoximetasone (TOPICORT) 0.25 % cream APPLY EXTERNALLY TO THE AFFECTED AREA TWICE DAILY AS NEEDED FOR RASH 60 g 0  . fluocinolone (SYNALAR) 0.01 % external solution APP ON THE SKIN D PRN  3  . lisinopril (ZESTRIL) 40 MG tablet TAKE 1 TABLET BY MOUTH EVERY DAY 90 tablet 3  . vardenafil (LEVITRA) 20 MG tablet Take 1 tablet (20 mg total) by mouth daily as needed. 10 tablet 11   No current facility-administered medications on file prior to visit.    Allergies  Allergen Reactions  . Codeine Itching and Rash    Past Medical History:  Diagnosis Date  . Cancer (HCC)    Basal cell  . ED (erectile dysfunction)   . Gout   . Hyperlipidemia   . Hypertension   . Obesity   . Osteoarthritis      Past Surgical History:  Procedure Laterality Date  . COLONOSCOPY  2009   benign polyp, rpt 10 yrs [facility] Plan)  . KNEE SURGERY     for bowlegs, age 89  . PATELLAR TENDON REPAIR  09/2008   left, Dr. Leilani Merl in [place]  . TOTAL KNEE ARTHROPLASTY  2007   bilateral  . TOTAL SHOULDER REPLACEMENT  05/2009   left     Family History  Problem Relation Age of Onset  . Hyperlipidemia Mother   . Cancer Mother        lymphoma  . Coronary artery disease Father   . Heart disease Father        CHF  . Colon cancer Neg Hx   . Colon polyps Neg Hx   . Esophageal cancer Neg Hx   . Stomach cancer Neg Hx   . Rectal cancer Neg Hx     Social History   Socioeconomic History  . Marital status: Married    Spouse name: Not on file  . Number of children: 2  . Years of education: Not on file  . Highest education level: Not on file  Occupational History  . Occupation: Landscape architect of [employer]    Employer: [employer]: Retired  Tobacco Use  . Smoking status: Former Research scientist (life sciences)  . Smokeless tobacco: Never  Used  . Tobacco comment: Quit in his 53's.  Vaping Use  . Vaping Use: Never used  Substance and Sexual Activity  . Alcohol use: No  . Drug use: Not Currently  . Sexual activity: Not on file  Other Topics Concern  . Not on file  Social History Narrative   Has living will   Wife, then daughter Lenna Sciara, would be health care POA   Would accept resuscitation   No long term life support (like ventilator or tube feeds)   Social Determinants of Health   Financial Resource Strain: Not on file  Food Insecurity: Not on file  Transportation Needs: Not on file  Physical Activity: Not on file  Stress: Not on file  Social Connections: Not on file  Intimate Partner Violence: Not on file   Review of Systems No loss of taste or smell No N/V/diarrhea Eating okay    Objective:   Physical Exam Constitutional:      Appearance: Normal appearance.  Pulmonary:      Effort: Pulmonary effort is normal. No respiratory distress.  Neurological:     Mental Status: He is alert.            Assessment & Plan:

## 2020-08-09 NOTE — Assessment & Plan Note (Addendum)
His symptoms are almost certainly COVID and I discussed this Too far out to consider Rx---so no need to test Will give benzonatate for cough--can use OTC guaifenisin/DM as well Tylenol may help Discussed---no indication for antibiotic Advised getting vaccine when symptoms abate

## 2020-09-30 ENCOUNTER — Other Ambulatory Visit: Payer: Self-pay | Admitting: Internal Medicine

## 2020-10-16 IMAGING — CR DG CHEST 2V
1 series · 3 of 3 positions shown · non-contrast
Comparison: None.

CLINICAL DATA: High blood pressure.

EXAM:
CHEST - 2 VIEW

[Series 1: w chest pa · 0.14mm/px · 3 of 3 slices shown]
[im 1/3]
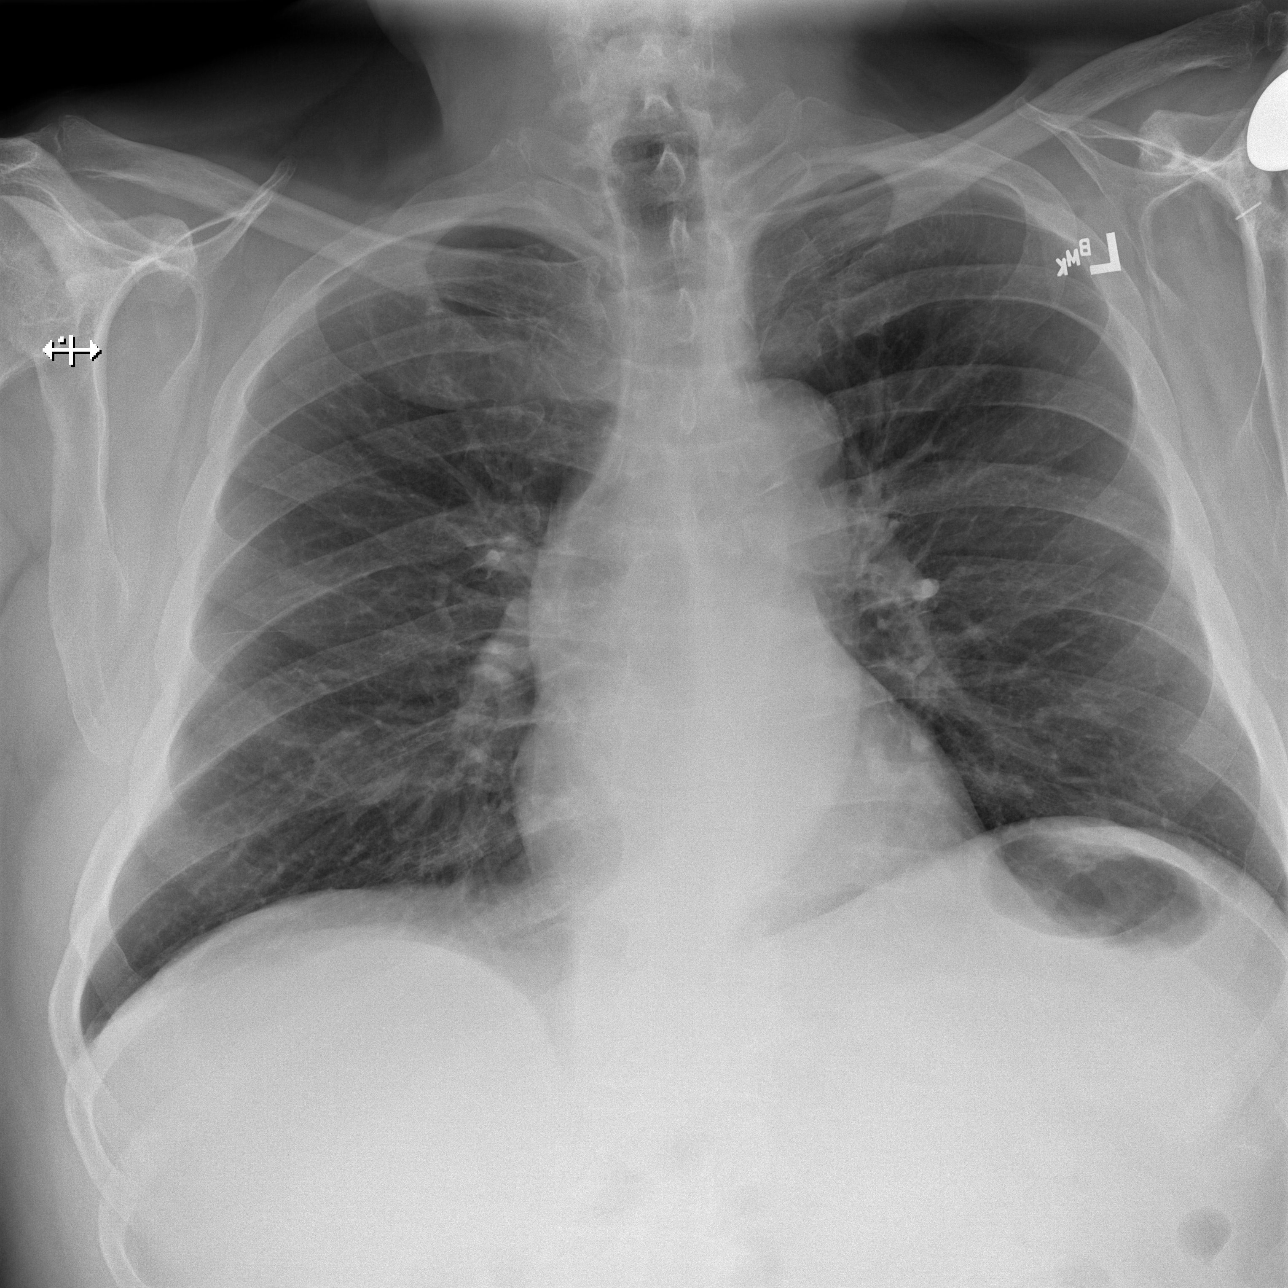
[im 2/3]
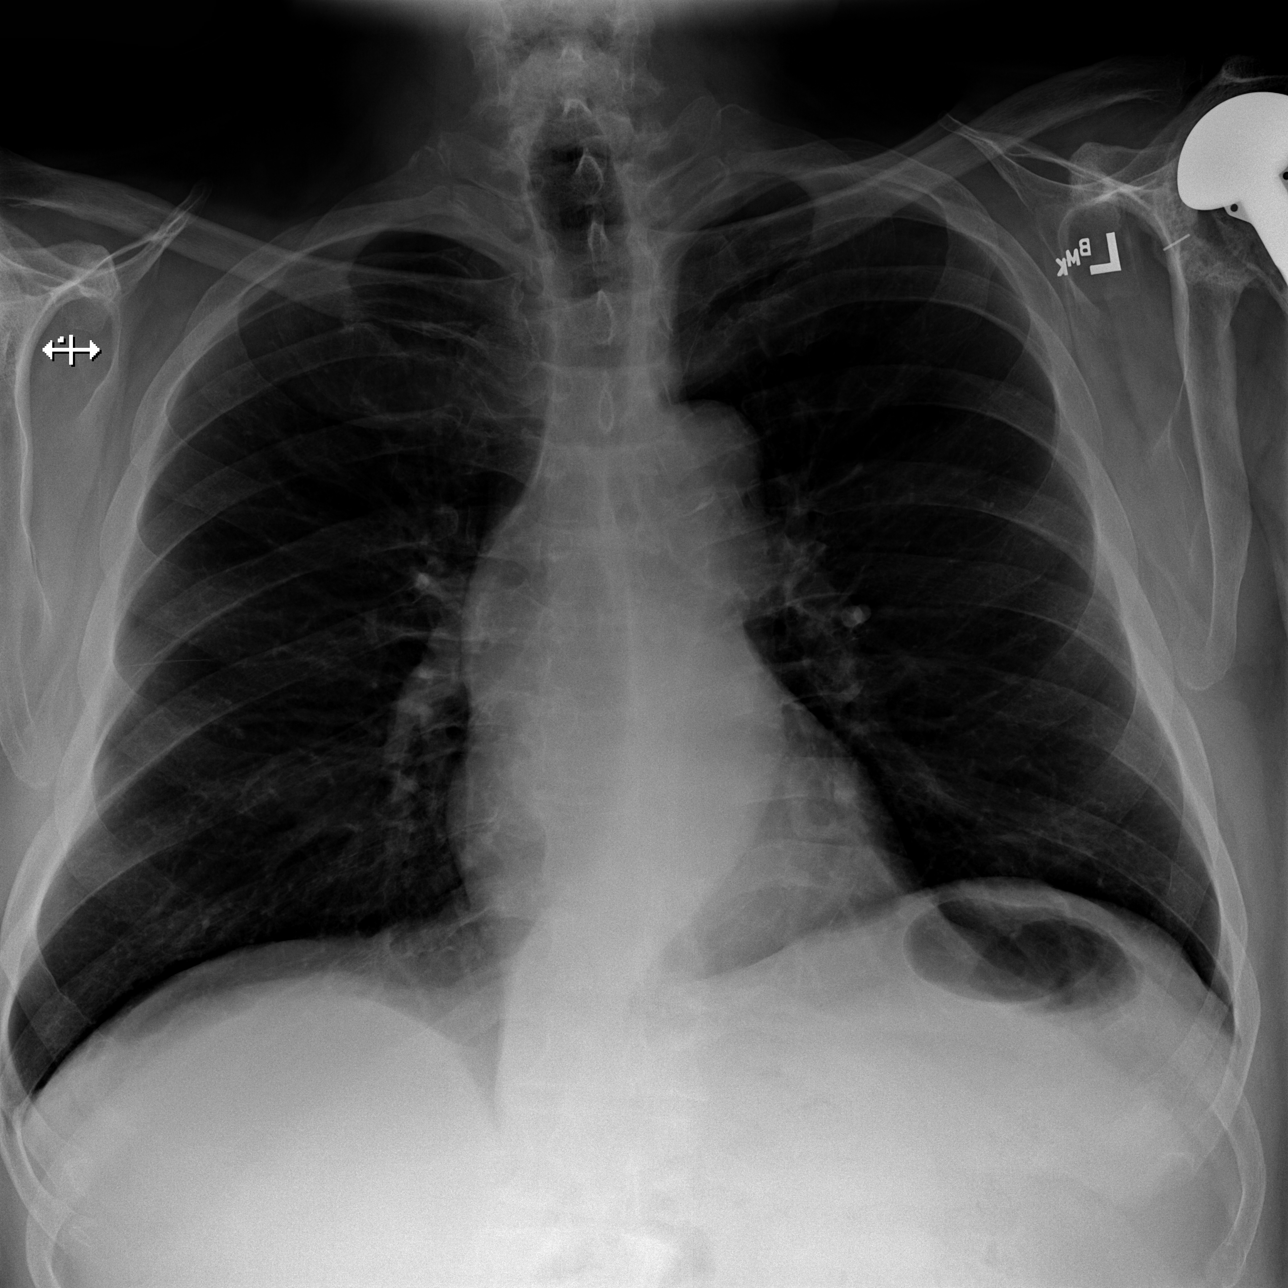
[im 3/3]
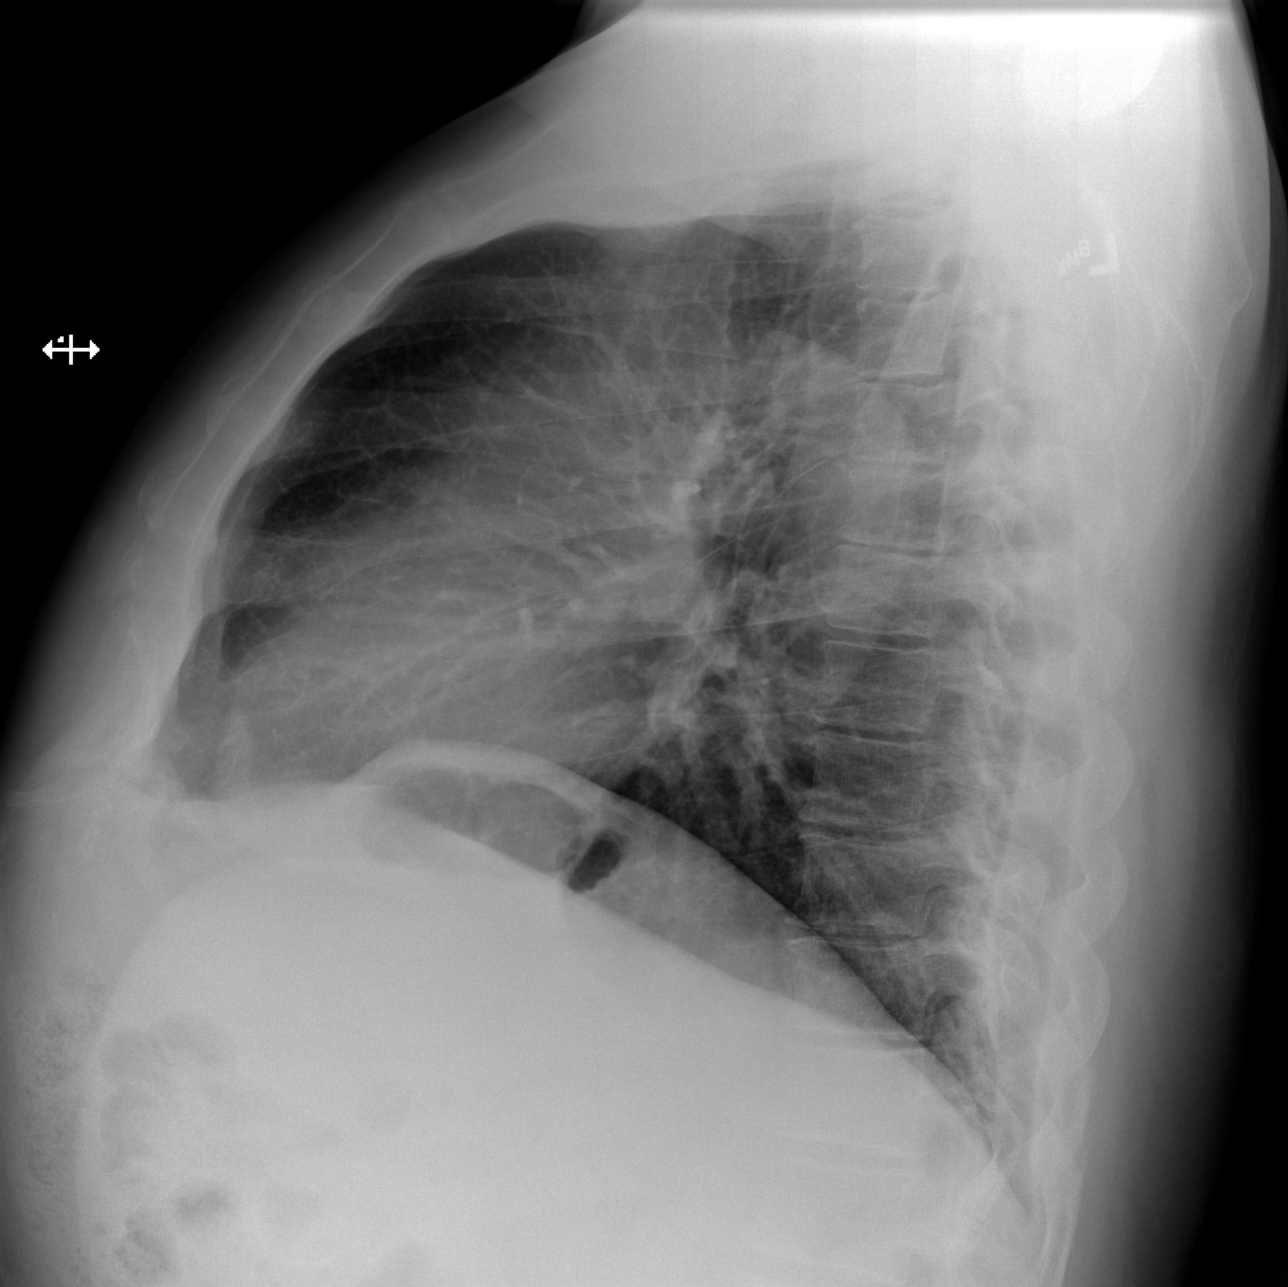

[3 of 3 positions shown; findings below may reference images not displayed]

FINDINGS: Normal heart size and pulmonary vascularity. No focal airspace
disease or consolidation in the lungs. No blunting of costophrenic
angles. No pneumothorax. Mediastinal contours appear intact.
Postoperative changes in the left shoulder. Calcified and tortuous
aorta.
IMPRESSION: No active cardiopulmonary disease.

## 2020-10-21 ENCOUNTER — Other Ambulatory Visit: Payer: Self-pay | Admitting: Internal Medicine

## 2020-11-13 ENCOUNTER — Other Ambulatory Visit: Payer: Self-pay | Admitting: Internal Medicine

## 2020-11-25 ENCOUNTER — Telehealth: Payer: Self-pay

## 2020-11-25 NOTE — Telephone Encounter (Signed)
Pt left v/m that he is requesting phentermine to jump start weight loss. Pt last medicare wellness was 06/19/20 and at that time Ht 6'0.25" and Wt 268 lbs.and BMI was 36.10kg/m2. Does Dr Silvio Pate see pts before starting phentermine. Sending note to [facility] CMA. Next appt scheduled is 06/24/21 for CPX.

## 2020-11-26 NOTE — Telephone Encounter (Signed)
Spoke to pt. Made appt 12-08-20.

## 2020-12-08 ENCOUNTER — Ambulatory Visit (INDEPENDENT_AMBULATORY_CARE_PROVIDER_SITE_OTHER): Payer: Medicare Other | Admitting: Internal Medicine

## 2020-12-08 ENCOUNTER — Encounter: Payer: Self-pay | Admitting: Internal Medicine

## 2020-12-08 ENCOUNTER — Other Ambulatory Visit: Payer: Self-pay

## 2020-12-08 MED ORDER — SEMAGLUTIDE(0.25 OR 0.5MG/DOS) 2 MG/1.5ML ~~LOC~~ SOPN: 0.2500 mg | PEN_INJECTOR | SUBCUTANEOUS | 5 refills | Status: DC

## 2020-12-08 NOTE — Progress Notes (Signed)
Subjective:    Patient ID: Sean Garcia, male    DOB: 10-27-1950, 70 y.o.   MRN: [id number]  HPI Here due to concerns about his weight This visit occurred during the SARS-CoV-2 public health emergency.  Safety protocols were in place, including screening questions prior to the visit, additional usage of staff PPE, and extensive cleaning of exam room while observing appropriate contact time as indicated for disinfecting solutions.   Ongoing concerns about his weight BMI 36 Within reason--he is careful with his eating Feels his weight is affecting his motivation Having trouble with exercise  8 days ago After moving bowels---and went to wipe--he felt a swollen lump (in perineum--not right by rectum) Seems to go away then reappear after moving bowels No pain No bleeding  Current Outpatient Medications on File Prior to Visit  Medication Sig Dispense Refill  . amLODipine (NORVASC) 5 MG tablet TAKE 1 TABLET BY MOUTH EVERY DAY 90 tablet 3  . atorvastatin (LIPITOR) 20 MG tablet TAKE 1 TABLET BY MOUTH EVERY DAY 90 tablet 3  . bisoprolol (ZEBETA) 10 MG tablet TAKE 1 TABLET BY MOUTH EVERY DAY 90 tablet 3  . colchicine 0.6 MG tablet TAKE 1 TABLET (0.6 MG TOTAL) BY MOUTH 2 (TWO) TIMES DAILY AS NEEDED. 180 tablet 1  . desoximetasone (TOPICORT) 0.25 % cream APPLY EXTERNALLY TO THE AFFECTED AREA TWICE DAILY AS NEEDED FOR RASH 60 g 0  . fluocinolone (SYNALAR) 0.01 % external solution APP ON THE SKIN D PRN  3  . lisinopril (ZESTRIL) 40 MG tablet TAKE 1 TABLET BY MOUTH EVERY DAY 90 tablet 3  . vardenafil (LEVITRA) 20 MG tablet Take 1 tablet (20 mg total) by mouth daily as needed. 10 tablet 11   No current facility-administered medications on file prior to visit.    Allergies  Allergen Reactions  . Codeine Itching and Rash    Past Medical History:  Diagnosis Date  . Cancer (HCC)    Basal cell  . ED (erectile dysfunction)   . Gout   . Hyperlipidemia   . Hypertension   . Obesity   .  Osteoarthritis     Past Surgical History:  Procedure Laterality Date  . COLONOSCOPY  2009   benign polyp, rpt 10 yrs [facility] Plan)  . KNEE SURGERY     for bowlegs, age 57  . PATELLAR TENDON REPAIR  09/2008   left, Dr. Leilani Merl in [place]  . TOTAL KNEE ARTHROPLASTY  2007   bilateral  . TOTAL SHOULDER REPLACEMENT  05/2009   left     Family History  Problem Relation Age of Onset  . Hyperlipidemia Mother   . Cancer Mother        lymphoma  . Coronary artery disease Father   . Heart disease Father        CHF  . Colon cancer Neg Hx   . Colon polyps Neg Hx   . Esophageal cancer Neg Hx   . Stomach cancer Neg Hx   . Rectal cancer Neg Hx     Social History   Socioeconomic History  . Marital status: Married    Spouse name: Not on file  . Number of children: 2  . Years of education: Not on file  . Highest education level: Not on file  Occupational History  . Occupation: Landscape architect of [employer]    Employer: [employer]: Retired  Tobacco Use  . Smoking status: Former Research scientist (life sciences)  . Smokeless  tobacco: Never Used  . Tobacco comment: Quit in his 51's.  Vaping Use  . Vaping Use: Never used  Substance and Sexual Activity  . Alcohol use: No  . Drug use: Not Currently  . Sexual activity: Not on file  Other Topics Concern  . Not on file  Social History Narrative   Has living will   Wife, then daughter Lenna Sciara, would be health care POA   Would accept resuscitation   No long term life support (like ventilator or tube feeds)   Social Determinants of Health   Financial Resource Strain: Not on file  Food Insecurity: Not on file  Transportation Needs: Not on file  Physical Activity: Not on file  Stress: Not on file  Social Connections: Not on file  Intimate Partner Violence: Not on file   Review of Systems Still has pain in left shoulder despite the surgery a few years ago Pain in right hip after a brief walk recently---surgeon told him it might  be nerve related    Objective:   Physical Exam Constitutional:      Appearance: Normal appearance.  Genitourinary:    Comments: Points to mid perineum as spot of mass but no mass, fistula, etc---normal exam (reassured--?perhaps bulge in muscle) Neurological:     Mental Status: He is alert.            Assessment & Plan:

## 2020-12-08 NOTE — Patient Instructions (Signed)
Please try the semaglutide injections---0.25mg  weekly for a month, then 0.5mg  weekly for a month. If you do well with that, let me know and I can increase the dose further.

## 2020-12-08 NOTE — Assessment & Plan Note (Signed)
BMI is 36 with HTN, HLD, gout Never got response from phentermine Discussed alternatives Will try semaglutide injections

## 2020-12-12 ENCOUNTER — Telehealth: Payer: Self-pay

## 2020-12-12 NOTE — Telephone Encounter (Signed)
PA completed on CoverMyMeds. Can take up to 72 hours for a response.

## 2020-12-24 NOTE — Telephone Encounter (Signed)
Covered for pts with Diabetes. Not covered for weight loss.

## 2021-01-27 DIAGNOSIS — L820 Inflamed seborrheic keratosis: Secondary | ICD-10-CM | POA: Diagnosis not present

## 2021-01-27 DIAGNOSIS — L570 Actinic keratosis: Secondary | ICD-10-CM | POA: Diagnosis not present

## 2021-01-27 DIAGNOSIS — L812 Freckles: Secondary | ICD-10-CM | POA: Diagnosis not present

## 2021-01-27 DIAGNOSIS — D225 Melanocytic nevi of trunk: Secondary | ICD-10-CM | POA: Diagnosis not present

## 2021-01-27 DIAGNOSIS — L308 Other specified dermatitis: Secondary | ICD-10-CM | POA: Diagnosis not present

## 2021-01-27 DIAGNOSIS — D224 Melanocytic nevi of scalp and neck: Secondary | ICD-10-CM | POA: Diagnosis not present

## 2021-01-27 DIAGNOSIS — L218 Other seborrheic dermatitis: Secondary | ICD-10-CM | POA: Diagnosis not present

## 2021-01-27 DIAGNOSIS — Z85828 Personal history of other malignant neoplasm of skin: Secondary | ICD-10-CM | POA: Diagnosis not present

## 2021-01-27 DIAGNOSIS — L821 Other seborrheic keratosis: Secondary | ICD-10-CM | POA: Diagnosis not present

## 2021-02-20 DIAGNOSIS — Z4889 Encounter for other specified surgical aftercare: Secondary | ICD-10-CM | POA: Diagnosis not present

## 2021-04-29 ENCOUNTER — Telehealth: Payer: Self-pay | Admitting: Internal Medicine

## 2021-04-29 MED ORDER — DESOXIMETASONE 0.25 % EX CREA: TOPICAL_CREAM | Freq: Two times a day (BID) | CUTANEOUS | 1 refills | Status: DC | PRN

## 2021-04-29 NOTE — Telephone Encounter (Signed)
Please let him know I sent the Rx

## 2021-04-29 NOTE — Telephone Encounter (Signed)
  Encourage patient to contact the pharmacy for refills or they can request refills through [place]:  Please schedule appointment if longer than 1 year  NEXT APPOINTMENT DATE:06/24/21  MEDICATION:desoximetasone  Is the patient out of medication? yes  PH[facility]ACY: [facility] [place]  Let patient know to contact pharmacy at the end of the day to make sure medication is ready.  Please notify patient to allow 48-72 hours to process  CLINICAL FILLS OUT ALL BELOW:   LAST REFILL:  QTY:  REFILL DATE:    OTHER COMMENTS:    Okay for refill?  Please advise

## 2021-04-29 NOTE — Telephone Encounter (Signed)
Last filled in 2019. Will forward to Dr Silvio Pate to approve.

## 2021-05-22 ENCOUNTER — Other Ambulatory Visit: Payer: Self-pay | Admitting: Internal Medicine

## 2021-06-19 ENCOUNTER — Other Ambulatory Visit: Payer: Self-pay | Admitting: Orthopedic Surgery

## 2021-06-19 DIAGNOSIS — M25511 Pain in right shoulder: Secondary | ICD-10-CM | POA: Diagnosis not present

## 2021-06-19 DIAGNOSIS — M25512 Pain in left shoulder: Secondary | ICD-10-CM

## 2021-06-19 DIAGNOSIS — M19012 Primary osteoarthritis, left shoulder: Secondary | ICD-10-CM | POA: Diagnosis not present

## 2021-06-19 DIAGNOSIS — M19011 Primary osteoarthritis, right shoulder: Secondary | ICD-10-CM | POA: Diagnosis not present

## 2021-06-22 DIAGNOSIS — M25412 Effusion, left shoulder: Secondary | ICD-10-CM | POA: Diagnosis not present

## 2021-06-22 DIAGNOSIS — M25512 Pain in left shoulder: Secondary | ICD-10-CM | POA: Diagnosis not present

## 2021-06-24 ENCOUNTER — Encounter: Payer: Medicare Other | Admitting: Internal Medicine

## 2021-06-30 DIAGNOSIS — M25512 Pain in left shoulder: Secondary | ICD-10-CM | POA: Diagnosis not present

## 2021-07-09 ENCOUNTER — Ambulatory Visit
Admission: RE | Admit: 2021-07-09 | Discharge: 2021-07-09 | Disposition: A | Discharge status: Home / Self Care | Payer: Medicare Other | Source: Ambulatory Visit | Attending: Orthopedic Surgery | Admitting: Orthopedic Surgery

## 2021-07-09 DIAGNOSIS — M25512 Pain in left shoulder: Secondary | ICD-10-CM

## 2021-07-09 DIAGNOSIS — M62512 Muscle wasting and atrophy, not elsewhere classified, left shoulder: Secondary | ICD-10-CM | POA: Diagnosis not present

## 2021-07-16 DIAGNOSIS — M25512 Pain in left shoulder: Secondary | ICD-10-CM | POA: Diagnosis not present

## 2021-08-04 ENCOUNTER — Other Ambulatory Visit: Payer: Self-pay

## 2021-08-04 ENCOUNTER — Encounter (HOSPITAL_BASED_OUTPATIENT_CLINIC_OR_DEPARTMENT_OTHER): Payer: Self-pay | Admitting: Orthopaedic Surgery

## 2021-08-10 ENCOUNTER — Encounter (HOSPITAL_BASED_OUTPATIENT_CLINIC_OR_DEPARTMENT_OTHER)
Admission: RE | Admit: 2021-08-10 | Discharge: 2021-08-10 | Disposition: A | Discharge status: Home / Self Care | Payer: Medicare Other | Source: Ambulatory Visit | Attending: Orthopaedic Surgery | Admitting: Orthopaedic Surgery

## 2021-08-10 DIAGNOSIS — Z01810 Encounter for preprocedural cardiovascular examination: Secondary | ICD-10-CM | POA: Diagnosis not present

## 2021-08-10 NOTE — Progress Notes (Signed)
Surgical soap given with instructions, pt verbalized understanding. Enhanced Recovery after Surgery  Enhanced Recovery after Surgery is a protocol used to improve the stress on your body and your recovery after surgery.  Patient Instructions  The night before surgery:  No food after midnight. ONLY clear liquids after midnight  The day of surgery (if you do NOT have diabetes):  Drink ONE (1) Pre-Surgery Clear Ensure as directed.   This drink was given to you during your hospital  pre-op appointment visit. The pre-op nurse will instruct you on the time to drink the  Pre-Surgery Ensure depending on your surgery time. Finish the drink at the designated time by the pre-op nurse.  Nothing else to drink after completing the  Pre-Surgery Clear Ensure.  The day of surgery (if you have diabetes): Drink ONE (1) Gatorade 2 (G2) as directed. This drink was given to you during your hospital  pre-op appointment visit.  The pre-op nurse will instruct you on the time to drink the   Gatorade 2 (G2) depending on your surgery time. Color of the Gatorade may vary. Red is not allowed. Nothing else to drink after completing the  Gatorade 2 (G2).         If office.you have questions, please contact your surgeons office  Benzoyl Peroxide gel given with instructions, pt verbalized understanding.

## 2021-08-12 ENCOUNTER — Encounter (HOSPITAL_BASED_OUTPATIENT_CLINIC_OR_DEPARTMENT_OTHER): Payer: Self-pay | Admitting: Orthopaedic Surgery
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
## 2021-08-12 NOTE — Discharge Instructions (Addendum)
Ophelia Charter MD, MPH Noemi Chapel, PA-C [place] [address] [phone] (tel)   [phone] (fax)   POST-OPERATIVE INSTRUCTIONS - SHOULDER ARTHROSCOPY  WOUND CARE You may remove the Operative Dressing on Post-Op Day #3 (72hrs after surgery).   Alternatively if you would like you can leave dressing on until follow-up if within 7-8 days but keep it dry. Leave steri-strips in place until they fall off on their own, usually 2 weeks postop. There may be a small amount of fluid/bleeding leaking at the surgical site.  This is normal; the shoulder is filled with fluid during the procedure and can leak for 24-48hrs after surgery.  You may change/reinforce the bandage as needed.  Use the Cryocuff or Ice as often as possible for the first 7 days, then as needed for pain relief. Always keep a towel, ACE wrap or other barrier between the cooling unit and your skin.  You may shower on Post-Op Day #3. Gently pat the area dry. Do not soak the shoulder in water or submerge it. Keep dry incisions as dry as possible. Do not go swimming in the pool or ocean until 4 weeks after surgery or when otherwise instructed.    EXERCISES/BRACING Sling to be used until your nerve block wears off After that you may use your sling for comfort You can remove sling for hygiene.    Please continue to ambulate and do not stay sitting or lying for too long. Perform foot and wrist pumps to assist in circulation.  POST-OP MEDICATIONS- Multimodal approach to pain control In general your pain will be controlled with a combination of substances.  Prescriptions unless otherwise discussed are electronically sent to your pharmacy.  This is a carefully made plan we use to minimize narcotic use.     Meloxicam - Anti-inflammatory medication taken on a scheduled basis Acetaminophen - Non-narcotic pain medicine taken on a scheduled basis  Tramadol - This is a strong narcotic, to be used only on  an as needed basis for SEVERE pain. Zofran - take as needed for nausea Doxycycline - this is an antibiotic, take as instructed   FOLLOW-UP If you develop a Fever (?101.5), Redness or Drainage from the surgical incision site, please call our office to arrange for an evaluation. Please call the office to schedule a follow-up appointment for your suture removal, 10-14 days post-operatively.    HELPFUL INFORMATION  If you had a block, it will wear off between 8-24 hrs postop typically.  This is period when your pain may go from nearly zero to the pain you would have had postop without the block.  This is an abrupt transition but nothing dangerous is happening.  You may take an extra dose of narcotic when this happens.  You may be more comfortable sleeping in a semi-seated position the first few nights following surgery.  Keep a pillow propped under the elbow and forearm for comfort.  If you have a recliner type of chair it might be beneficial.  If not that is fine too, but it would be helpful to sleep propped up with pillows behind your operated shoulder as well under your elbow and forearm.  This will reduce pulling on the suture lines.  When dressing, put your operative arm in the sleeve first.  When getting undressed, take your operative arm out last.  Loose fitting, button-down shirts are recommended.  Often in the first days after surgery you may be more comfortable keeping your operative arm  under your shirt and not through the sleeve.  You may return to work/school in the next couple of days when you feel up to it.  Desk work and typing in the sling is fine.  We suggest you use the pain medication the first night prior to going to bed, in order to ease any pain when the anesthesia wears off. You should avoid taking pain medications on an empty stomach as it will make you nauseous.  You should wean off your narcotic medicines as soon as you are able.  Most patients will be off or using  minimal narcotics before their first postop appointment.   Do not drink alcoholic beverages or take illicit drugs when taking pain medications.  It is against the law to drive while taking narcotics.  In some states it is against the law to drive while your arm is in a sling.   Pain medication may make you constipated.  Below are a few solutions to try in this order: Decrease the amount of pain medication if you aren't having pain. Drink lots of decaffeinated fluids. Drink prune juice and/or eat dried prunes  If the first 3 don't work start with additional solutions Take Colace - an over-the-counter stool softener Take Senokot - an over-the-counter laxative Take Miralax - a stronger over-the-counter laxative  For more information including helpful videos and documents visit our website:   [email or web address]        Post Anesthesia Home Care Instructions  Activity: Get plenty of rest for the remainder of the day. A responsible individual must stay with you for 24 hours following the procedure.  For the next 24 hours, DO NOT: -Drive a car -Paediatric nurse -Drink alcoholic beverages -Take any medication unless instructed by your physician -Make any legal decisions or sign important papers.  Meals: Start with liquid foods such as gelatin or soup. Progress to regular foods as tolerated. Avoid greasy, spicy, heavy foods. If nausea and/or vomiting occur, drink only clear liquids until the nausea and/or vomiting subsides. Call your physician if vomiting continues.  Special Instructions/Symptoms: Your throat may feel dry or sore from the anesthesia or the breathing tube placed in your throat during surgery. If this causes discomfort, gargle with warm salt water. The discomfort should disappear within 24 hours.  If you had a scopolamine patch placed behind your ear for the management of post- operative nausea and/or vomiting:  1. The medication in  the patch is effective for 72 hours, after which it should be removed.  Wrap patch in a tissue and discard in the trash. Wash hands thoroughly with soap and water. 2. You may remove the patch earlier than 72 hours if you experience unpleasant side effects which may include dry mouth, dizziness or visual disturbances. 3. Avoid touching the patch. Wash your hands with soap and water after contact with the patch.        Regional Anesthesia Blocks  1. Numbness or the inability to move the "blocked" extremity may last from 3-48 hours after placement. The length of time depends on the medication injected and your individual response to the medication. If the numbness is not going away after 48 hours, call your surgeon.  2. The extremity that is blocked will need to be protected until the numbness is gone and the  Strength has returned. Because you cannot feel it, you will need to take extra care to avoid injury. Because it may be weak, you may have difficulty moving it or using it.  You may not know what position it is in without looking at it while the block is in effect.  3. For blocks in the legs and feet, returning to weight bearing and walking needs to be done carefully. You will need to wait until the numbness is entirely gone and the strength has returned. You should be able to move your leg and foot normally before you try and bear weight or walk. You will need someone to be with you when you first try to ensure you do not fall and possibly risk injury.  4. Bruising and tenderness at the needle site are common side effects and will resolve in a few days.  5. Persistent numbness or new problems with movement should be communicated to the surgeon or the [place] [phone] [place] [phone]).     Information for Discharge Teaching: EXPAREL (bupivacaine liposome injectable suspension)   Your surgeon or anesthesiologist gave you EXPAREL(bupivacaine) to  help control your pain after surgery.  EXPAREL is a local anesthetic that provides pain relief by numbing the tissue around the surgical site. EXPAREL is designed to release pain medication over time and can control pain for up to 72 hours. Depending on how you respond to EXPAREL, you may require less pain medication during your recovery.  Possible side effects: Temporary loss of sensation or ability to move in the area where bupivacaine was injected. Nausea, vomiting, constipation Rarely, numbness and tingling in your mouth or lips, lightheadedness, or anxiety may occur. Call your doctor right away if you think you may be experiencing any of these sensations, or if you have other questions regarding possible side effects.  Follow all other discharge instructions given to you by your surgeon or nurse. Eat a healthy diet and drink plenty of water or other fluids.  If you return to the hospital for any reason within 96 hours following the administration of EXPAREL, it is important for health care providers to know that you have received this anesthetic. A teal colored band has been placed on your arm with the date, time and amount of EXPAREL you have received in order to alert and inform your health care providers. Please leave this armband in place for the full 96 hours following administration, and then you may remove the band.

## 2021-08-12 NOTE — Anesthesia Preprocedure Evaluation (Addendum)
Anesthesia Evaluation  Patient identified by MRN, date of birth, ID band Patient awake    Reviewed: Allergy & Precautions, NPO status , Patient's Chart, lab work & pertinent test results, reviewed documented beta blocker date and time   Airway Mallampati: II  TM Distance: >3 FB Neck ROM: Full    Dental no notable dental hx. (+) Teeth Intact   Pulmonary former smoker,    Pulmonary exam normal breath sounds clear to auscultation       Cardiovascular hypertension, Pt. on medications Normal cardiovascular exam Rhythm:Regular Rate:Normal     Neuro/Psych negative neurological ROS  negative psych ROS   GI/Hepatic GERD  Medicated and Controlled,  Endo/Other  Morbid obesityHyperlipidemia Gout  Renal/GU   negative genitourinary   Musculoskeletal  (+) Arthritis , Osteoarthritis,  Left shoulder pain   Abdominal (+) + obese,   Peds  Hematology negative hematology ROS (+)   Anesthesia Other Findings   Reproductive/Obstetrics ED                            Anesthesia Physical Anesthesia Plan  ASA: 3  Anesthesia Plan: General   Post-op Pain Management: Regional block   Induction: Intravenous  PONV Risk Score and Plan: 3 and Treatment may vary due to age or medical condition, Ondansetron and Dexamethasone  Airway Management Planned: Oral ETT  Additional Equipment: None  Intra-op Plan:   Post-operative Plan: Extubation in OR  Informed Consent: I have reviewed the patients History and Physical, chart, labs and discussed the procedure including the risks, benefits and alternatives for the proposed anesthesia with the patient or authorized representative who has indicated his/her understanding and acceptance.     Dental advisory given  Plan Discussed with: CRNA and Anesthesiologist  Anesthesia Plan Comments:        Anesthesia Quick Evaluation

## 2021-08-13 ENCOUNTER — Ambulatory Visit (HOSPITAL_BASED_OUTPATIENT_CLINIC_OR_DEPARTMENT_OTHER): Payer: Medicare Other | Admitting: Anesthesiology

## 2021-08-13 ENCOUNTER — Encounter (HOSPITAL_BASED_OUTPATIENT_CLINIC_OR_DEPARTMENT_OTHER)
Admission: RE | Disposition: A | Discharge status: Home / Self Care | Payer: Self-pay | Source: Home / Self Care | Attending: Orthopaedic Surgery

## 2021-08-13 ENCOUNTER — Other Ambulatory Visit: Payer: Self-pay

## 2021-08-13 ENCOUNTER — Encounter (HOSPITAL_BASED_OUTPATIENT_CLINIC_OR_DEPARTMENT_OTHER): Payer: Self-pay | Admitting: Orthopaedic Surgery

## 2021-08-13 ENCOUNTER — Ambulatory Visit (HOSPITAL_BASED_OUTPATIENT_CLINIC_OR_DEPARTMENT_OTHER)
Admission: RE | Admit: 2021-08-13 | Discharge: 2021-08-13 | Disposition: A | Discharge status: Home / Self Care | Payer: Medicare Other | Attending: Orthopaedic Surgery | Admitting: Orthopaedic Surgery

## 2021-08-13 DIAGNOSIS — T8459XA Infection and inflammatory reaction due to other internal joint prosthesis, initial encounter: Secondary | ICD-10-CM | POA: Diagnosis not present

## 2021-08-13 DIAGNOSIS — G8918 Other acute postprocedural pain: Secondary | ICD-10-CM | POA: Diagnosis not present

## 2021-08-13 DIAGNOSIS — E785 Hyperlipidemia, unspecified: Secondary | ICD-10-CM | POA: Insufficient documentation

## 2021-08-13 DIAGNOSIS — Z6834 Body mass index (BMI) 34.0-34.9, adult: Secondary | ICD-10-CM | POA: Insufficient documentation

## 2021-08-13 DIAGNOSIS — X58XXXA Exposure to other specified factors, initial encounter: Secondary | ICD-10-CM | POA: Insufficient documentation

## 2021-08-13 DIAGNOSIS — Z87891 Personal history of nicotine dependence: Secondary | ICD-10-CM | POA: Diagnosis not present

## 2021-08-13 DIAGNOSIS — M1990 Unspecified osteoarthritis, unspecified site: Secondary | ICD-10-CM | POA: Insufficient documentation

## 2021-08-13 DIAGNOSIS — M24112 Other articular cartilage disorders, left shoulder: Secondary | ICD-10-CM | POA: Diagnosis not present

## 2021-08-13 DIAGNOSIS — K219 Gastro-esophageal reflux disease without esophagitis: Secondary | ICD-10-CM | POA: Diagnosis not present

## 2021-08-13 DIAGNOSIS — T84018A Broken internal joint prosthesis, other site, initial encounter: Secondary | ICD-10-CM | POA: Diagnosis not present

## 2021-08-13 DIAGNOSIS — M109 Gout, unspecified: Secondary | ICD-10-CM | POA: Diagnosis not present

## 2021-08-13 DIAGNOSIS — I10 Essential (primary) hypertension: Secondary | ICD-10-CM | POA: Insufficient documentation

## 2021-08-13 DIAGNOSIS — E669 Obesity, unspecified: Secondary | ICD-10-CM | POA: Insufficient documentation

## 2021-08-13 DIAGNOSIS — Z79899 Other long term (current) drug therapy: Secondary | ICD-10-CM | POA: Insufficient documentation

## 2021-08-13 DIAGNOSIS — T84038A Mechanical loosening of other internal prosthetic joint, initial encounter: Secondary | ICD-10-CM | POA: Insufficient documentation

## 2021-08-13 HISTORY — DX: Gastro-esophageal reflux disease without esophagitis: K21.9

## 2021-08-13 HISTORY — PX: SYNOVIAL BIOPSY: SHX5041

## 2021-08-13 HISTORY — PX: SHOULDER ARTHROSCOPY: SHX128

## 2021-08-13 SURGERY — ARTHROSCOPY, SHOULDER
Anesthesia: General | Site: Shoulder | Laterality: Left

## 2021-08-13 MED ORDER — BUPIVACAINE HCL (PF) 0.5 % IJ SOLN
INTRAMUSCULAR | Status: DC | PRN
Start: 2021-08-13 — End: 2021-08-13
  Administered 2021-08-13: 15 mL via PERINEURAL

## 2021-08-13 MED ORDER — FENTANYL CITRATE (PF) 100 MCG/2ML IJ SOLN
INTRAMUSCULAR | Status: AC
  Filled 2021-08-13: qty 2

## 2021-08-13 MED ORDER — ONDANSETRON HCL 4 MG/2ML IJ SOLN
INTRAMUSCULAR | Status: DC | PRN
  Administered 2021-08-13: 4 mg via INTRAVENOUS

## 2021-08-13 MED ORDER — OXYCODONE HCL 5 MG PO TABS: ORAL_TABLET | ORAL | 0 refills | Status: DC

## 2021-08-13 MED ORDER — SODIUM CHLORIDE 0.9 % IR SOLN
Status: DC | PRN
  Administered 2021-08-13: 3000 mL

## 2021-08-13 MED ORDER — FENTANYL CITRATE (PF) 100 MCG/2ML IJ SOLN
INTRAMUSCULAR | Status: DC | PRN
  Administered 2021-08-13: 100 ug via INTRAVENOUS

## 2021-08-13 MED ORDER — MIDAZOLAM HCL 2 MG/2ML IJ SOLN
1.0000 mg | Freq: Once | INTRAMUSCULAR | Status: AC
  Administered 2021-08-13: 1 mg via INTRAVENOUS

## 2021-08-13 MED ORDER — ACETAMINOPHEN 500 MG PO TABS: 1000.0000 mg | ORAL_TABLET | Freq: Three times a day (TID) | ORAL | 0 refills | Status: DC

## 2021-08-13 MED ORDER — FENTANYL CITRATE (PF) 100 MCG/2ML IJ SOLN
50.0000 ug | Freq: Once | INTRAMUSCULAR | Status: AC
  Administered 2021-08-13: 50 ug via INTRAVENOUS

## 2021-08-13 MED ORDER — MIDAZOLAM HCL 2 MG/2ML IJ SOLN
INTRAMUSCULAR | Status: AC
  Filled 2021-08-13: qty 2

## 2021-08-13 MED ORDER — DOXYCYCLINE HYCLATE 100 MG PO CAPS: 100.0000 mg | ORAL_CAPSULE | Freq: Two times a day (BID) | ORAL | 0 refills | Status: DC

## 2021-08-13 MED ORDER — ATROPINE SULFATE 0.4 MG/ML IV SOLN
INTRAVENOUS | Status: AC
  Filled 2021-08-13: qty 1

## 2021-08-13 MED ORDER — ONDANSETRON HCL 4 MG/2ML IJ SOLN
INTRAMUSCULAR | Status: AC
  Filled 2021-08-13: qty 2

## 2021-08-13 MED ORDER — ONDANSETRON HCL 4 MG/2ML IJ SOLN: 4.0000 mg | Freq: Once | INTRAMUSCULAR | Status: DC | PRN

## 2021-08-13 MED ORDER — EPINEPHRINE PF 1 MG/ML IJ SOLN
INTRAMUSCULAR | Status: AC
  Filled 2021-08-13: qty 4

## 2021-08-13 MED ORDER — DEXAMETHASONE SODIUM PHOSPHATE 10 MG/ML IJ SOLN
INTRAMUSCULAR | Status: AC
  Filled 2021-08-13: qty 1

## 2021-08-13 MED ORDER — CEFAZOLIN SODIUM-DEXTROSE 2-4 GM/100ML-% IV SOLN
2.0000 g | INTRAVENOUS | Status: AC
  Administered 2021-08-13: 3 g via INTRAVENOUS

## 2021-08-13 MED ORDER — OXYCODONE HCL 5 MG/5ML PO SOLN: 5.0000 mg | Freq: Once | ORAL | Status: DC | PRN

## 2021-08-13 MED ORDER — ONDANSETRON HCL 4 MG PO TABS: 4.0000 mg | ORAL_TABLET | Freq: Three times a day (TID) | ORAL | 0 refills | Status: AC | PRN

## 2021-08-13 MED ORDER — VANCOMYCIN HCL 1000 MG IV SOLR
INTRAVENOUS | Status: AC
  Filled 2021-08-13: qty 20

## 2021-08-13 MED ORDER — SUGAMMADEX SODIUM 200 MG/2ML IV SOLN
INTRAVENOUS | Status: DC | PRN
  Administered 2021-08-13: 400 mg via INTRAVENOUS

## 2021-08-13 MED ORDER — DEXAMETHASONE SODIUM PHOSPHATE 4 MG/ML IJ SOLN
INTRAMUSCULAR | Status: DC | PRN
  Administered 2021-08-13: 5 mg via INTRAVENOUS

## 2021-08-13 MED ORDER — BUPIVACAINE LIPOSOME 1.3 % IJ SUSP
INTRAMUSCULAR | Status: DC | PRN
  Administered 2021-08-13: 10 mL via PERINEURAL

## 2021-08-13 MED ORDER — MELOXICAM 15 MG PO TABS: 15.0000 mg | ORAL_TABLET | Freq: Every day | ORAL | 0 refills | Status: DC

## 2021-08-13 MED ORDER — ROCURONIUM BROMIDE 100 MG/10ML IV SOLN
INTRAVENOUS | Status: DC | PRN
  Administered 2021-08-13: 70 mg via INTRAVENOUS

## 2021-08-13 MED ORDER — LIDOCAINE HCL (CARDIAC) PF 100 MG/5ML IV SOSY
PREFILLED_SYRINGE | INTRAVENOUS | Status: DC | PRN
  Administered 2021-08-13: 100 mg via INTRAVENOUS

## 2021-08-13 MED ORDER — PROPOFOL 10 MG/ML IV BOLUS
INTRAVENOUS | Status: AC
  Filled 2021-08-13: qty 20

## 2021-08-13 MED ORDER — CEFAZOLIN SODIUM-DEXTROSE 2-4 GM/100ML-% IV SOLN
INTRAVENOUS | Status: AC
  Filled 2021-08-13: qty 100

## 2021-08-13 MED ORDER — FENTANYL CITRATE (PF) 100 MCG/2ML IJ SOLN: 25.0000 ug | INTRAMUSCULAR | Status: DC | PRN

## 2021-08-13 MED ORDER — TRAMADOL HCL 50 MG PO TABS
50.0000 mg | ORAL_TABLET | Freq: Four times a day (QID) | ORAL | 0 refills | Status: DC | PRN
Start: 2021-08-13 — End: 2021-08-24

## 2021-08-13 MED ORDER — LACTATED RINGERS IV SOLN: INTRAVENOUS | Status: DC

## 2021-08-13 MED ORDER — CEFAZOLIN IN SODIUM CHLORIDE 3-0.9 GM/100ML-% IV SOLN: 3.0000 g | INTRAVENOUS | Status: DC

## 2021-08-13 MED ORDER — EPINEPHRINE PF 1 MG/ML IJ SOLN
INTRAMUSCULAR | Status: AC
  Filled 2021-08-13: qty 2

## 2021-08-13 MED ORDER — ROCURONIUM BROMIDE 10 MG/ML (PF) SYRINGE
PREFILLED_SYRINGE | INTRAVENOUS | Status: AC
  Filled 2021-08-13: qty 10

## 2021-08-13 MED ORDER — PROPOFOL 10 MG/ML IV BOLUS
INTRAVENOUS | Status: DC | PRN
  Administered 2021-08-13: 150 mg via INTRAVENOUS

## 2021-08-13 MED ORDER — OXYCODONE HCL 5 MG PO TABS: 5.0000 mg | ORAL_TABLET | Freq: Once | ORAL | Status: DC | PRN

## 2021-08-13 SURGICAL SUPPLY — 59 items
AID PSTN UNV HD RSTRNT DISP (MISCELLANEOUS) ×1
APL PRP STRL LF DISP 70% ISPRP (MISCELLANEOUS) ×1
BLADE EXCALIBUR 4.0X13 (MISCELLANEOUS) ×2 IMPLANT
BURR OVAL 8 FLU 4.0X13 (MISCELLANEOUS) IMPLANT
CANNULA 5.75X71 LONG (CANNULA) IMPLANT
CANNULA PASSPORT 5 (CANNULA) IMPLANT
CANNULA PASSPORT BUTTON 10-40 (CANNULA) IMPLANT
CANNULA TWIST IN 8.25X7CM (CANNULA) IMPLANT
CHLORAPREP W/TINT 26 (MISCELLANEOUS) ×2 IMPLANT
CNTNR URN SCR LID CUP LEK RST (MISCELLANEOUS) IMPLANT
CONT SPEC 4OZ STRL OR WHT (MISCELLANEOUS) ×6
COOLER ICEMAN CLASSIC (MISCELLANEOUS) ×2 IMPLANT
DRAPE IMP U-DRAPE 54X76 (DRAPES) ×2 IMPLANT
DRAPE INCISE IOBAN 66X45 STRL (DRAPES) IMPLANT
DRAPE SHOULDER BEACH CHAIR (DRAPES) ×2 IMPLANT
DRSG PAD ABDOMINAL 8X10 ST (GAUZE/BANDAGES/DRESSINGS) ×2 IMPLANT
DW OUTFLOW CASSETTE/TUBE SET (MISCELLANEOUS) ×1 IMPLANT
GAUZE SPONGE 4X4 12PLY STRL (GAUZE/BANDAGES/DRESSINGS) ×2 IMPLANT
GLOVE SRG 8 PF TXTR STRL LF DI (GLOVE) ×1 IMPLANT
GLOVE SURG ENC MOIS LTX SZ6.5 (GLOVE) ×2 IMPLANT
GLOVE SURG LTX SZ8 (GLOVE) ×2 IMPLANT
GLOVE SURG UNDER POLY LF SZ6.5 (GLOVE) ×2 IMPLANT
GLOVE SURG UNDER POLY LF SZ8 (GLOVE) ×2
GOWN STRL REUS W/ TWL LRG LVL3 (GOWN DISPOSABLE) ×2 IMPLANT
GOWN STRL REUS W/TWL LRG LVL3 (GOWN DISPOSABLE) ×4
GOWN STRL REUS W/TWL XL LVL3 (GOWN DISPOSABLE) ×2 IMPLANT
KIT SHOULDER STAB MARCO (KITS) ×2 IMPLANT
KIT STR SPEAR 1.8 FBRTK DISP (KITS) IMPLANT
LASSO 90 CVE QUICKPAS (DISPOSABLE) IMPLANT
LASSO CRESCENT QUICKPASS (SUTURE) IMPLANT
MANIFOLD NEPTUNE II (INSTRUMENTS) ×2 IMPLANT
NDL SAFETY ECLIPSE 18X1.5 (NEEDLE) ×1 IMPLANT
NDL SCORPION MULTI FIRE (NEEDLE) IMPLANT
NEEDLE HYPO 18GX1.5 SHARP (NEEDLE) ×2
NEEDLE SCORPION MULTI FIRE (NEEDLE) IMPLANT
PACK ARTHROSCOPY DSU (CUSTOM PROCEDURE TRAY) ×2 IMPLANT
PACK BASIN DAY SURGERY FS (CUSTOM PROCEDURE TRAY) ×2 IMPLANT
PAD COLD SHLDR WRAP-ON (PAD) ×2 IMPLANT
PAD ORTHO SHOULDER 7X19 LRG (SOFTGOODS) ×1 IMPLANT
PORT APPOLLO RF 90DEGREE MULTI (SURGICAL WAND) ×2 IMPLANT
RESTRAINT HEAD UNIVERSAL NS (MISCELLANEOUS) ×2 IMPLANT
SHEET MEDIUM DRAPE 40X70 STRL (DRAPES) ×2 IMPLANT
SLEEVE SCD COMPRESS KNEE MED (STOCKING) ×2 IMPLANT
SLING ARM FOAM STRAP LRG (SOFTGOODS) ×1 IMPLANT
STRIP CLOSURE SKIN 1/2X4 (GAUZE/BANDAGES/DRESSINGS) ×2 IMPLANT
SUT FIBERWIRE #2 38 T-5 BLUE (SUTURE)
SUT MNCRL AB 4-0 PS2 18 (SUTURE) ×2 IMPLANT
SUT PDS AB 0 CT 36 (SUTURE) IMPLANT
SUT PDS AB 1 CT  36 (SUTURE)
SUT PDS AB 1 CT 36 (SUTURE) IMPLANT
SUT TIGER TAPE 7 IN WHITE (SUTURE) IMPLANT
SUTURE FIBERWR #2 38 T-5 BLUE (SUTURE) IMPLANT
SUTURE TAPE TIGERLINK 1.3MM BL (SUTURE) IMPLANT
SUTURETAPE TIGERLINK 1.3MM BL (SUTURE)
SYR 5ML LL (SYRINGE) ×2 IMPLANT
TAPE FIBER 2MM 7IN #2 BLUE (SUTURE) IMPLANT
TOWEL GREEN STERILE FF (TOWEL DISPOSABLE) ×4 IMPLANT
TUBE CONNECTING 20X1/4 (TUBING) ×2 IMPLANT
TUBING ARTHROSCOPY IRRIG 16FT (MISCELLANEOUS) ×2 IMPLANT

## 2021-08-13 NOTE — Progress Notes (Signed)
Assisted Dr. Royce Macadamia with left, ultrasound guided, interscalene  block. Side rails up, monitors on throughout procedure. See vital signs in flow sheet. Tolerated Procedure well.

## 2021-08-13 NOTE — Anesthesia Procedure Notes (Signed)
Anesthesia Regional Block: Interscalene brachial plexus block   Pre-Anesthetic Checklist: , timeout performed,  Correct Patient, Correct Site, Correct Laterality,  Correct Procedure, Correct Position, site marked,  Risks and benefits discussed,  Surgical consent,  Pre-op evaluation,  At surgeon's request and post-op pain management  Laterality: Left  Prep: chloraprep       Needles:  Injection technique: Single-shot  Needle Type: Echogenic Stimulator Needle     Needle Length: 10cm  Needle Gauge: 21   Needle insertion depth: 6 cm   Additional Needles:   Procedures:,,,, ultrasound used (permanent image in chart),,   Motor weakness within 5 minutes.  Narrative:  Start time: 08/13/2021 8:00 AM End time: 08/13/2021 8:05 AM Injection made incrementally with aspirations every 5 mL.  Performed by: Personally  Anesthesiologist: Josephine Igo, MD  Additional Notes: Timeout performed. Patient sedated. Relevant anatomy ID'd using [place]. Incremental 2-70ml injection of LA with frequent aspiration. Patient tolerated procedure well.    Left Interscalene Block

## 2021-08-13 NOTE — Anesthesia Postprocedure Evaluation (Signed)
Anesthesia Post Note  Patient: OMARRI EICH  Procedure(s) Performed: ARTHROSCOPY SHOULDER (Left: Shoulder) SYNOVIAL BIOPSY (Left: Shoulder)     Patient location during evaluation: PACU Anesthesia Type: General Level of consciousness: awake and alert and oriented Pain management: pain level controlled Vital Signs Assessment: post-procedure vital signs reviewed and stable Respiratory status: spontaneous breathing, nonlabored ventilation and respiratory function stable Cardiovascular status: blood pressure returned to baseline and stable Postop Assessment: no apparent nausea or vomiting Anesthetic complications: no   No notable events documented.  Last Vitals:  Vitals:   08/13/21 1015 08/13/21 1030  BP: (!) 144/104 (!) 135/99  Pulse: 69 69  Resp: 10 16  Temp:    SpO2: 94% 95%    Last Pain:  Vitals:   08/13/21 1039  TempSrc:   PainSc: 0-No pain                 Verniece Encarnacion A.

## 2021-08-13 NOTE — Op Note (Signed)
Orthopaedic Surgery Operative Note (CSN: [id number])  IVO [name]  [birth year] Date of Surgery: 08/13/2021   DIAGNOSES: Left shoulder,  failure of hardware and suspected infection .  POST-OPERATIVE DIAGNOSIS: same  PROCEDURE: Arthroscopic extensive debridement - 29823 Subdeltoid Bursa and Supraspinatus Tendon, synovium Arthroscopic biopsy [id number]   OPERATIVE FINDING: Patient's exam anesthesia was extremely limited with only about 90 degrees of forward flexion.  It is clear that his implants it lost fixation were angled posteriorly.  Our scope was relatively routine.  There was gross purulence within the joint and considerable synovitis consistent with an infection as we had suspected.  We took multiple samples from around the implants themselves.  We will plan for explantation with possible bone grafting and antibiotic spacer placement with revision to custom implants at a later date likely.   Post-operative plan: The patient will be non-weightbearing in a sling until block wears off and then can return to activities to tolerance.  The patient will be discharged home on antibiotics with the suspected infection.  DVT prophylaxis not indicated in ambulatory upper extremity patient without known risk factors.   Pain control with PRN pain medication preferring oral medicines.  Follow up plan will be scheduled in approximately 7 days for incision check and XR.  We will plan his definitive surgery in the upcoming weeks.  Surgeons:Primary: Sean Gash, MD Assistants:Sean McBane PA-C Location: [facility] OR ROOM 5 Anesthesia: General with Exparel interscalene block Antibiotics: Ancef 2 g Tourniquet time: None Estimated Blood Loss: Minimal Complications: None Specimens: 3 for culture hold for 3 weeks to rule out C acnes Implants: * No implants in log *  Indications for Surgery:   Sean Garcia is a 71 y.o. 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  There is suspicion of infection and we felt that a biopsy would be appropriate before considering custom implantation.  Risk benefits alternatives were all discussed and the patient like to proceed.  Procedure:   Patient was correctly identified in the preoperative holding area and operative site marked.  Patient brought to OR and positioned beachchair on an Tennyson table ensuring that all bony prominences were padded and the head was in an appropriate location.  Anesthesia was induced and the operative shoulder was prepped and draped in the usual sterile fashion.  Timeout was called preincision.  A standard posterior viewing portal was made after localizing the portal with a spinal needle.  An anterior accessory portal was also made.  After clearing the articular space the camera was positioned in the subacromial space.  Findings above.    We used a shaver as well as a basket to perform extensive debridement of the joint including the capsule, cuff, synovium and bone.  We identified the implants and performed multiple synovial biopsies from the capsule of and synovium.  These were passed off for culture being held for 3 weeks.     The incisions were closed with absorbable monocryl and steri strips.  A sterile dressing was placed along with a sling. The patient was awoken from general anesthesia and taken to the PACU in stable condition without complication.   Sean Chapel, PA-C, present and scrubbed throughout the case, critical for completion in a timely fashion, and for retraction, instrumentation, closure.

## 2021-08-13 NOTE — Interval H&P Note (Signed)
All questions answered

## 2021-08-13 NOTE — Transfer of Care (Signed)
Immediate Anesthesia Transfer of Care Note  Patient: Sean Garcia  Procedure(s) Performed: ARTHROSCOPY SHOULDER (Left: Shoulder) SYNOVIAL BIOPSY (Left: Shoulder)  Patient Location: PACU  Anesthesia Type:General and regional  Level of Consciousness: awake, drowsy and patient cooperative  Airway & Oxygen Therapy: Patient Spontanous Breathing and Patient connected to face mask oxygen  Post-op Assessment: Report given to RN and Post -op Vital signs reviewed and stable  Post vital signs: Reviewed and stable  Last Vitals:  Vitals Value Taken Time  BP    Temp    Pulse    Resp    SpO2      Last Pain:  Vitals:   08/13/21 0658  TempSrc: Oral  PainSc: 0-No pain         Complications: No notable events documented.

## 2021-08-14 ENCOUNTER — Encounter (HOSPITAL_BASED_OUTPATIENT_CLINIC_OR_DEPARTMENT_OTHER): Payer: Self-pay | Admitting: Orthopaedic Surgery

## 2021-08-14 NOTE — Addendum Note (Signed)
Addendum  created 08/14/21 0746 by Maryella Shivers, CRNA   Charge Capture section accepted

## 2021-08-17 ENCOUNTER — Other Ambulatory Visit: Payer: Self-pay | Admitting: Internal Medicine

## 2021-08-17 NOTE — Progress Notes (Signed)
Surgery orders requested via Epic inbox. °

## 2021-08-18 NOTE — Progress Notes (Addendum)
For Short Stay: COVID SWAB appointment date: 08/24/2021 Date of COVID positive in last 90 days: N/A   For Anesthesia: PCP - Venia Carbon, MD in epic Cardiologist - N/A  Chest x-ray - greater than 1 year in epic EKG - 08/10/21 in epic Stress Test - N/A ECHO - N/A Cardiac Cath - N/A Pacemaker/ICD device last checked:N/A Pacemaker orders received:N/A Device Rep notified:N/A  Spinal Cord Stimulator:N/A  Sleep Study - N/A CPAP - N/A  Fasting Blood Sugar - N/A Checks Blood Sugar __N/A___ times a day Date and result of last Hgb A1c-N/A  Blood Thinner Instructions:N/A Aspirin Instructions:N/A Last Dose:N/A  Activity level: Can go up a flight of stairs and activities of daily living without stopping and without chest pain and/or shortness of breath      Anesthesia review: N/A  Patient denies shortness of breath, fever, cough and chest pain at PAT appointment   Patient verbalized understanding of instructions that were given to them at the PAT appointment. Patient was also instructed that they will need to review over the PAT instructions again at home before surgery.

## 2021-08-19 ENCOUNTER — Other Ambulatory Visit: Payer: Self-pay

## 2021-08-19 ENCOUNTER — Encounter (HOSPITAL_COMMUNITY): Payer: Self-pay | Admitting: Orthopaedic Surgery

## 2021-08-19 NOTE — Patient Instructions (Addendum)
DUE TO COVID-19 ONLY ONE VISITOR IS ALLOWED TO COME WITH YOU AND STAY IN THE WAITING ROOM ONLY DURING PRE OP AND PROCEDURE.   **NO VISITORS ARE ALLOWED IN THE SHORT STAY AREA OR RECOVERY ROOM!!**  IF YOU WILL BE ADMITTED INTO THE HOSPITAL YOU ARE ALLOWED ONLY TWO SUPPORT PEOPLE DURING VISITATION HOURS ONLY (7 AM -8PM)   The support person(s) must pass our screening, gel in and out, and wear a mask at all times, including in the patients room. Patients must also wear a mask when staff or their support person are in the room. Visitors GUEST BADGE MUST BE WORN VISIBLY  One adult visitor may remain with you overnight and MUST be in the room by 8 P.M.  No visitors under the age of 96. Any visitor under the age of 3 must be accompanied by an adult.    COVID SWAB TESTING MUST BE COMPLETED ON:  Monday, Feb. 6, 2023 at 9:15AM   Site: [facility] [place] Enter: Main Entrance have a seat in the waiting area to the right of main entrance (DO NOT [place]!!!!!) Dial: [phone] to alert staff you have arrived  You are not required to quarantine, however you are required to wear a well-fitted mask when you are out and around people not in your household.  Hand Hygiene often Do NOT share personal items Notify your provider if you are in close contact with someone who has COVID or you develop fever 100.4 or greater, new onset of sneezing, cough, sore throat, shortness of breath or body aches.       Your procedure is scheduled on: Wednesday, Feb. 8, 2023   Report to [facility] Main Entrance    Report to admitting at 12:00 PM   Call this number if you have problems the morning of surgery [phone]   Do not eat food :After Midnight.   May have liquids until  11:45 AM day of surgery  CLEAR LIQUID DIET  Foods Allowed                                                                     Foods Excluded  Water, Black Coffee and tea,  regular and decaf                             liquids that you cannot  Plain Jell-O in any flavor  (No red)                                           see through such as: Fruit ices (not with fruit pulp)                                     milk, soups, orange juice              Iced Popsicles (No red)  All solid food                                   Apple juices Sports drinks like Gatorade (No red) Lightly seasoned clear broth or consume(fat free) Sugar  Sample Menu Breakfast                                Lunch                                     Supper Cranberry juice                    Beef broth                            Chicken broth Jell-O                                     Grape juice                           Apple juice Coffee or tea                        Jell-O                                      Popsicle                                                Coffee or tea                        Coffee or tea         The day of surgery:  Drink ONE (1) Pre-Surgery Clear Ensure at 11:45 AM the morning of surgery. Drink in one sitting. Do not sip.  This drink was given to you during your hospital  pre-op appointment visit. Nothing else to drink after completing the  Pre-Surgery Clear Ensure.          If you have questions, please contact your surgeons office.  FOLLOW BOWEL PREP INSTRUCTIONS YOU RECEIVED FROM YOUR SURGEON'S OFFICE!!!     Oral Hygiene is also important to reduce your risk of infection.                                    Remember - BRUSH YOUR TEETH THE MORNING OF SURGERY WITH YOUR REGULAR TOOTHPASTE   Do NOT smoke after Midnight   Take these medicines the morning of surgery with A SIP OF WATER: Tylenol, Amlodipine, Bisoprolol, Colchicine             You may not have any metal on your body including jewelry, and body piercing             Do  not wear lotions, powders, perfumes/cologne, or deodorant              Men may shave  face and neck.   Do not bring valuables to the hospital. [place].   Contacts, dentures or bridgework may not be worn into surgery.   Bring small overnight bag day of surgery.    Patients discharged on the day of surgery will not be allowed to drive home.  Someone needs to stay with you for the first 24 hours after anesthesia.   Special Instructions: Bring a copy of your healthcare power of attorney and living will documents         the day of surgery if you haven't scanned them before.              Please read over the following fact sheets you were given: IF YOU HAVE QUESTIONS ABOUT YOUR PRE-OP INSTRUCTIONS PLEASE CALL [place]- Preparing for Total Shoulder Arthroplasty    Before surgery, you can play an important role. Because skin is not sterile, your skin needs to be as free of germs as possible. You can reduce the number of germs on your skin by using the following products. Benzoyl Peroxide Gel Reduces the number of germs present on the skin Applied twice a day to shoulder area starting two days before surgery    ==================================================================  Please follow these instructions carefully:  BENZOYL PEROXIDE 5% GEL  Please do not use if you have an allergy to benzoyl peroxide.   If your skin becomes reddened/irritated stop using the benzoyl peroxide.  Starting two days before surgery, apply as follows: Monday and Tuesday Apply benzoyl peroxide in the morning and at night. Apply after taking a shower. If you are not taking a shower clean entire shoulder front, back, and side along with the armpit with a clean wet washcloth.  Place a quarter-sized dollop on your shoulder and rub in thoroughly, making sure to cover the front, back, and side of your shoulder, along with the armpit.   2 days before ____ AM   ____ PM              1 day before ____ AM   ____ PM                          Do this twice a day for two days.  (Last application is the night before surgery, AFTER using the CHG soap as described below).  Do NOT apply benzoyl peroxide gel on the day of surgery.  ______________________________________________________________   [facility] - Preparing for Surgery Before surgery, you can play an important role.  Because skin is not sterile, your skin needs to be as free of germs as possible.  You can reduce the number of germs on your skin by washing with CHG (chlorahexidine gluconate) soap before surgery.  CHG is an antiseptic cleaner which kills germs and bonds with the skin to continue killing germs even after washing. Please DO NOT use if you have an allergy to CHG or antibacterial soaps.  If your skin becomes reddened/irritated stop using the CHG and inform your nurse when you arrive at Short Stay. Do not shave (including legs and underarms) for at least 48 hours prior to the first CHG shower.  You may shave your face/neck.  Please follow these instructions carefully:  1.  Shower with CHG Soap the night before surgery and the  morning of surgery.  2.  If you choose to wash your hair, wash your hair first as usual with your normal  shampoo.  3.  After you shampoo, rinse your hair and body thoroughly to remove the shampoo.                             4.  Use CHG as you would any other liquid soap.  You can apply chg directly to the skin and wash.  Gently with a scrungie or clean washcloth.  5.  Apply the CHG Soap to your body ONLY FROM THE NECK DOWN.   Do   not use on face/ open                           Wound or open sores. Avoid contact with eyes, ears mouth and   genitals (private parts).                       Wash face,  Genitals (private parts) with your normal soap.             6.  Wash thoroughly, paying special attention to the area where your    surgery  will be performed.  7.  Thoroughly rinse your body with warm water from the neck down.  8.  DO NOT  shower/wash with your normal soap after using and rinsing off the CHG Soap.                9.  Pat yourself dry with a clean towel.            10.  Wear clean pajamas.            11.  Place clean sheets on your bed the night of your first shower and do not  sleep with pets. Day of Surgery : Do not apply any lotions/deodorants the morning of surgery.  Please wear clean clothes to the hospital/surgery center.  FAILURE TO FOLLOW THESE INSTRUCTIONS MAY RESULT IN THE CANCELLATION OF YOUR SURGERY  PATIENT SIGNATURE_________________________________  NURSE SIGNATURE__________________________________  ________________________________________________________________________

## 2021-08-19 NOTE — Progress Notes (Signed)
Attempted to obtain medical history via telephone, unable to reach at this time. I left a voicemail to return pre surgical testing department's phone call.  

## 2021-08-21 DIAGNOSIS — M24112 Other articular cartilage disorders, left shoulder: Secondary | ICD-10-CM | POA: Diagnosis not present

## 2021-08-24 ENCOUNTER — Ambulatory Visit (INDEPENDENT_AMBULATORY_CARE_PROVIDER_SITE_OTHER): Payer: Medicare Other | Admitting: Internal Medicine

## 2021-08-24 ENCOUNTER — Other Ambulatory Visit: Payer: Self-pay

## 2021-08-24 ENCOUNTER — Encounter (HOSPITAL_COMMUNITY)
Admission: RE | Admit: 2021-08-24 | Discharge: 2021-08-24 | Disposition: A | Discharge status: Home / Self Care | Payer: Medicare Other | Source: Ambulatory Visit

## 2021-08-24 ENCOUNTER — Encounter (HOSPITAL_COMMUNITY)
Admission: RE | Admit: 2021-08-24 | Discharge: 2021-08-24 | Disposition: A | Discharge status: Home / Self Care | Payer: Medicare Other | Source: Ambulatory Visit | Attending: Orthopaedic Surgery | Admitting: Orthopaedic Surgery

## 2021-08-24 ENCOUNTER — Encounter: Payer: Self-pay | Admitting: Internal Medicine

## 2021-08-24 DIAGNOSIS — N529 Male erectile dysfunction, unspecified: Secondary | ICD-10-CM | POA: Diagnosis not present

## 2021-08-24 DIAGNOSIS — Z01818 Encounter for other preprocedural examination: Secondary | ICD-10-CM | POA: Insufficient documentation

## 2021-08-24 DIAGNOSIS — Z20822 Contact with and (suspected) exposure to covid-19: Secondary | ICD-10-CM | POA: Diagnosis not present

## 2021-08-24 DIAGNOSIS — J9811 Atelectasis: Secondary | ICD-10-CM | POA: Diagnosis not present

## 2021-08-24 DIAGNOSIS — Z471 Aftercare following joint replacement surgery: Secondary | ICD-10-CM | POA: Diagnosis not present

## 2021-08-24 DIAGNOSIS — I10 Essential (primary) hypertension: Secondary | ICD-10-CM

## 2021-08-24 DIAGNOSIS — Z83438 Family history of other disorder of lipoprotein metabolism and other lipidemia: Secondary | ICD-10-CM | POA: Diagnosis not present

## 2021-08-24 DIAGNOSIS — Z96642 Presence of left artificial hip joint: Secondary | ICD-10-CM | POA: Diagnosis not present

## 2021-08-24 DIAGNOSIS — E785 Hyperlipidemia, unspecified: Secondary | ICD-10-CM | POA: Diagnosis not present

## 2021-08-24 DIAGNOSIS — Z8249 Family history of ischemic heart disease and other diseases of the circulatory system: Secondary | ICD-10-CM | POA: Diagnosis not present

## 2021-08-24 DIAGNOSIS — Z96612 Presence of left artificial shoulder joint: Secondary | ICD-10-CM | POA: Diagnosis not present

## 2021-08-24 DIAGNOSIS — Y831 Surgical operation with implant of artificial internal device as the cause of abnormal reaction of the patient, or of later complication, without mention of misadventure at the time of the procedure: Secondary | ICD-10-CM | POA: Diagnosis present

## 2021-08-24 DIAGNOSIS — Z01812 Encounter for preprocedural laboratory examination: Secondary | ICD-10-CM | POA: Insufficient documentation

## 2021-08-24 DIAGNOSIS — G8918 Other acute postprocedural pain: Secondary | ICD-10-CM | POA: Diagnosis not present

## 2021-08-24 DIAGNOSIS — T8459XA Infection and inflammatory reaction due to other internal joint prosthesis, initial encounter: Secondary | ICD-10-CM | POA: Diagnosis not present

## 2021-08-24 DIAGNOSIS — T797XXA Traumatic subcutaneous emphysema, initial encounter: Secondary | ICD-10-CM | POA: Diagnosis not present

## 2021-08-24 DIAGNOSIS — M25412 Effusion, left shoulder: Secondary | ICD-10-CM | POA: Diagnosis not present

## 2021-08-24 DIAGNOSIS — I2510 Atherosclerotic heart disease of native coronary artery without angina pectoris: Secondary | ICD-10-CM

## 2021-08-24 DIAGNOSIS — T84018A Broken internal joint prosthesis, other site, initial encounter: Secondary | ICD-10-CM | POA: Diagnosis not present

## 2021-08-24 DIAGNOSIS — M109 Gout, unspecified: Secondary | ICD-10-CM | POA: Diagnosis not present

## 2021-08-24 DIAGNOSIS — Z87891 Personal history of nicotine dependence: Secondary | ICD-10-CM | POA: Diagnosis not present

## 2021-08-24 DIAGNOSIS — Z807 Family history of other malignant neoplasms of lymphoid, hematopoietic and related tissues: Secondary | ICD-10-CM | POA: Diagnosis not present

## 2021-08-24 LAB — BASIC METABOLIC PANEL
Anion gap: 8 (ref 5–15)
BUN: 12 mg/dL (ref 8–23)
CO2: 24 mmol/L (ref 22–32)
Calcium: 9.9 mg/dL (ref 8.9–10.3)
Chloride: 105 mmol/L (ref 98–111)
Creatinine, Ser: 0.74 mg/dL (ref 0.61–1.24)
GFR, Estimated: >60 mL/min (ref >60)
Glucose, Bld: 101 mg/dL — ABNORMAL HIGH (ref 70–99)
Potassium: 4.3 mmol/L (ref 3.5–5.1)
Sodium: 137 mmol/L (ref 135–145)

## 2021-08-24 LAB — CBC
HCT: 44.2 % (ref 39.0–52.0)
Hemoglobin: 14.5 g/dL (ref 13.0–17.0)
MCH: 29.2 pg (ref 26.0–34.0)
MCHC: 32.8 g/dL (ref 30.0–36.0)
MCV: 88.9 fL (ref 80.0–100.0)
Platelets: 133 10*3/uL — ABNORMAL LOW (ref 150–400)
RBC: 4.97 MIL/uL (ref 4.22–5.81)
RDW: 13.2 % (ref 11.5–15.5)
WBC: 6.9 10*3/uL (ref 4.0–10.5)
nRBC: 0 % (ref 0.0–0.2)

## 2021-08-24 LAB — SURGICAL PCR SCREEN
MRSA, PCR: NEGATIVE
Staphylococcus aureus: POSITIVE — AB

## 2021-08-24 LAB — SARS CORONAVIRUS 2 (TAT 6-24 HRS): SARS Coronavirus 2: NEGATIVE
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
## 2021-08-24 NOTE — Assessment & Plan Note (Signed)
Has infected shoulder with hardware not articulating correctly  Has been otherwise healthy Recent EKG in January is benign No chest pain, SOB, respiratory infection, etc  No medical contraindications to proposed low risk procedure Okay to proceed

## 2021-08-24 NOTE — Progress Notes (Signed)
PCR results sent to Dr. Griffin Basil to review.

## 2021-08-24 NOTE — Assessment & Plan Note (Signed)
BP Readings from Last 3 Encounters:  08/24/21 140/88  08/24/21 (!) 150/103  08/13/21 (!) 144/96   Good control on amlodipine 5mg , lisinopril 40mg , bisoprolol 10mg  daily

## 2021-08-24 NOTE — Assessment & Plan Note (Signed)
Decided against the semaglutide injections Has lost some weight No Rx for this for now

## 2021-08-24 NOTE — Progress Notes (Signed)
Subjective:    Patient ID: Sean Garcia, male    DOB: [birth year], 71 y.o.   MRN: [id number]  HPI Here for medical clearance before left shoulder surgery Had infection in joint so needs removal of hardware, synovectomy, antibiotic spacer placement,hemiarthroplasty with donor bone  Had shoulder replacement 2010 then 2019 it was replaced Never did do well  Always had limited mobility---multiple repeat visits with surgeon Tried topical diclofenac--not really helpful Finally had CT scan--referred to Dr Griffin Basil after Dr Mardelle Matte  Had arthroscopy 1/26 Concern for infection (gram negative rods but culture negative) Hardware was all loose  Has been feeling okay other than shoulders No chest pain or SOB No fever No infections other than mild colds No palpitations No dizziness or syncope No cough  Current Outpatient Medications on File Prior to Visit  Medication Sig Dispense Refill   acetaminophen (TYLENOL) 500 MG tablet Take 2 tablets (1,000 mg total) by mouth every 8 (eight) hours for 14 days. 84 tablet 0   amLODipine (NORVASC) 5 MG tablet TAKE 1 TABLET BY MOUTH EVERY DAY 90 tablet 3   atorvastatin (LIPITOR) 20 MG tablet TAKE 1 TABLET BY MOUTH EVERY DAY (Patient taking differently: every evening. TAKE 1 TABLET BY MOUTH EVERY DAY) 90 tablet 0   bisoprolol (ZEBETA) 10 MG tablet TAKE 1 TABLET BY MOUTH EVERY DAY 90 tablet 3   colchicine 0.6 MG tablet TAKE 1 TABLET (0.6 MG TOTAL) BY MOUTH 2 (TWO) TIMES DAILY AS NEEDED. 180 tablet 1   desoximetasone (TOPICORT) 0.25 % cream Apply topically 2 (two) times daily as needed. 60 g 1   doxycycline (VIBRAMYCIN) 100 MG capsule Take 1 capsule (100 mg total) by mouth 2 (two) times daily for 21 days. 42 capsule 0   lisinopril (ZESTRIL) 40 MG tablet TAKE 1 TABLET BY MOUTH EVERY DAY 90 tablet 3   oxyCODONE (OXY IR/ROXICODONE) 5 MG immediate release tablet Take 5 mg by mouth every 6 (six) hours as needed for pain.     vardenafil (LEVITRA) 20 MG tablet Take 1  tablet (20 mg total) by mouth daily as needed. 10 tablet 11   No current facility-administered medications on file prior to visit.    Allergies  Allergen Reactions   Meloxicam     Stomach ulcer was developed   Codeine Itching and Rash    Past Medical History:  Diagnosis Date   Cancer ([place])    Basal cell   ED (erectile dysfunction)    GERD (gastroesophageal reflux disease)    Gout    Hyperlipidemia    Hypertension    Obesity    Osteoarthritis     Past Surgical History:  Procedure Laterality Date   COLONOSCOPY  2009   benign polyp, rpt 10 yrs [facility])   KNEE SURGERY     for bowlegs, age 76   [place]  09/2008   left, Dr. Leilani Merl in [place] ARTHROSCOPY Left 08/13/2021   Procedure: ARTHROSCOPY SHOULDER;  Surgeon: Hiram Gash, MD;  Location: [place];  Service: Orthopedics;  Laterality: Left;   SYNOVIAL BIOPSY Left 08/13/2021   Procedure: SYNOVIAL BIOPSY;  Surgeon: Hiram Gash, MD;  Location: [place];  Service: Orthopedics;  Laterality: Left;   TOTAL KNEE ARTHROPLASTY  2007   bilateral   TOTAL SHOULDER REPLACEMENT  05/2009   left     Family History  Problem Relation Age of Onset   Hyperlipidemia Mother    Cancer Mother  lymphoma   Coronary artery disease Father    Heart disease Father        CHF   Colon cancer Neg Hx    Colon polyps Neg Hx    Esophageal cancer Neg Hx    Stomach cancer Neg Hx    Rectal cancer Neg Hx     Social History   Socioeconomic History   Marital status: Married    Spouse name: Not on file   Number of children: 2   Years of education: Not on file   Highest education level: Not on file  Occupational History   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  Vaping Use   Vaping Use: Never used   Substance and Sexual Activity   Alcohol use: No   Drug use: Not Currently   Sexual activity: Yes  Other Topics Concern   Not on file  Social History Narrative   Has living will   Wife, then daughter Lenna Sciara, would be health care POA   Would accept resuscitation   No long term life support (like ventilator or tube feeds)   Social Determinants of Health   Financial Resource Strain: Not on file  Food Insecurity: Not on file  Transportation Needs: Not on file  Physical Activity: Not on file  Stress: Not on file  Social Connections: Not on file  Intimate Partner Violence: Not on file   Review of Systems Never started the semaglutide injections Weight is down 6# since May     Objective:   Physical Exam Constitutional:      Appearance: Normal appearance.  Cardiovascular:     Rate and Rhythm: Normal rate and regular rhythm.     Heart sounds: No murmur heard.   No gallop.  Pulmonary:     Effort: Pulmonary effort is normal.     Breath sounds: Normal breath sounds. No wheezing or rales.  Abdominal:     Palpations: Abdomen is soft.     Tenderness: There is no abdominal tenderness.  Musculoskeletal:     Cervical back: Neck supple.  Lymphadenopathy:     Cervical: No cervical adenopathy.  Neurological:     Mental Status: He is alert.  Psychiatric:        Mood and Affect: Mood normal.        Behavior: Behavior normal.           Assessment & Plan:

## 2021-08-26 ENCOUNTER — Observation Stay (HOSPITAL_COMMUNITY): Payer: Medicare Other

## 2021-08-26 ENCOUNTER — Other Ambulatory Visit: Payer: Self-pay

## 2021-08-26 ENCOUNTER — Encounter (HOSPITAL_COMMUNITY)
Admission: RE | Disposition: A | Discharge status: Home / Self Care | Payer: Self-pay | Source: Home / Self Care | Attending: Orthopaedic Surgery

## 2021-08-26 ENCOUNTER — Ambulatory Visit (HOSPITAL_COMMUNITY): Payer: Medicare Other | Admitting: Certified Registered Nurse Anesthetist

## 2021-08-26 ENCOUNTER — Encounter (HOSPITAL_COMMUNITY): Payer: Self-pay | Admitting: Orthopaedic Surgery

## 2021-08-26 ENCOUNTER — Inpatient Hospital Stay (HOSPITAL_COMMUNITY)
Admission: RE | Admit: 2021-08-26 | Discharge: 2021-08-27 | DRG: 483 | Disposition: A | Discharge status: Home / Self Care | Payer: Medicare Other | Attending: Orthopaedic Surgery | Admitting: Orthopaedic Surgery

## 2021-08-26 DIAGNOSIS — Z83438 Family history of other disorder of lipoprotein metabolism and other lipidemia: Secondary | ICD-10-CM

## 2021-08-26 DIAGNOSIS — M109 Gout, unspecified: Secondary | ICD-10-CM | POA: Diagnosis not present

## 2021-08-26 DIAGNOSIS — E785 Hyperlipidemia, unspecified: Secondary | ICD-10-CM | POA: Diagnosis present

## 2021-08-26 DIAGNOSIS — Z87891 Personal history of nicotine dependence: Secondary | ICD-10-CM | POA: Diagnosis not present

## 2021-08-26 DIAGNOSIS — Z807 Family history of other malignant neoplasms of lymphoid, hematopoietic and related tissues: Secondary | ICD-10-CM

## 2021-08-26 DIAGNOSIS — N529 Male erectile dysfunction, unspecified: Secondary | ICD-10-CM | POA: Diagnosis not present

## 2021-08-26 DIAGNOSIS — I10 Essential (primary) hypertension: Secondary | ICD-10-CM | POA: Diagnosis not present

## 2021-08-26 DIAGNOSIS — Z20822 Contact with and (suspected) exposure to covid-19: Secondary | ICD-10-CM | POA: Diagnosis present

## 2021-08-26 DIAGNOSIS — Z09 Encounter for follow-up examination after completed treatment for conditions other than malignant neoplasm: Secondary | ICD-10-CM

## 2021-08-26 DIAGNOSIS — Z471 Aftercare following joint replacement surgery: Secondary | ICD-10-CM | POA: Diagnosis not present

## 2021-08-26 DIAGNOSIS — Z96612 Presence of left artificial shoulder joint: Secondary | ICD-10-CM

## 2021-08-26 DIAGNOSIS — G8918 Other acute postprocedural pain: Secondary | ICD-10-CM | POA: Diagnosis not present

## 2021-08-26 DIAGNOSIS — Y831 Surgical operation with implant of artificial internal device as the cause of abnormal reaction of the patient, or of later complication, without mention of misadventure at the time of the procedure: Secondary | ICD-10-CM | POA: Diagnosis present

## 2021-08-26 DIAGNOSIS — T8459XA Infection and inflammatory reaction due to other internal joint prosthesis, initial encounter: Principal | ICD-10-CM | POA: Diagnosis present

## 2021-08-26 DIAGNOSIS — Z01818 Encounter for other preprocedural examination: Secondary | ICD-10-CM

## 2021-08-26 DIAGNOSIS — Z8249 Family history of ischemic heart disease and other diseases of the circulatory system: Secondary | ICD-10-CM | POA: Diagnosis not present

## 2021-08-26 DIAGNOSIS — Z452 Encounter for adjustment and management of vascular access device: Secondary | ICD-10-CM

## 2021-08-26 DIAGNOSIS — I2510 Atherosclerotic heart disease of native coronary artery without angina pectoris: Secondary | ICD-10-CM

## 2021-08-26 DIAGNOSIS — Z96642 Presence of left artificial hip joint: Secondary | ICD-10-CM | POA: Diagnosis not present

## 2021-08-26 DIAGNOSIS — T84018A Broken internal joint prosthesis, other site, initial encounter: Secondary | ICD-10-CM | POA: Diagnosis not present

## 2021-08-26 HISTORY — PX: TOTAL SHOULDER ARTHROPLASTY: SHX126

## 2021-08-26 HISTORY — PX: HARDWARE REMOVAL: SHX979

## 2021-08-26 HISTORY — PX: SYNOVECTOMY: SHX5180

## 2021-08-26 LAB — C-REACTIVE PROTEIN: CRP: 0.6 mg/dL (ref <1.0)

## 2021-08-26 LAB — SEDIMENTATION RATE: Sed Rate: 5 mm/hr (ref 0–16)

## 2021-08-26 SURGERY — ARTHROPLASTY, SHOULDER, TOTAL
Anesthesia: General | Site: Shoulder | Laterality: Left

## 2021-08-26 MED ORDER — METOCLOPRAMIDE HCL 5 MG PO TABS: 5.0000 mg | ORAL_TABLET | Freq: Three times a day (TID) | ORAL | Status: DC | PRN

## 2021-08-26 MED ORDER — PHENYLEPHRINE HCL-NACL 20-0.9 MG/250ML-% IV SOLN
INTRAVENOUS | Status: DC | PRN
  Administered 2021-08-26: 40 ug/min via INTRAVENOUS

## 2021-08-26 MED ORDER — BUPIVACAINE LIPOSOME 1.3 % IJ SUSP
INTRAMUSCULAR | Status: DC | PRN
Start: 2021-08-26 — End: 2021-08-26
  Administered 2021-08-26: 10 mL via PERINEURAL

## 2021-08-26 MED ORDER — PHENYLEPHRINE 40 MCG/ML (10ML) SYRINGE FOR IV PUSH (FOR BLOOD PRESSURE SUPPORT)
PREFILLED_SYRINGE | INTRAVENOUS | Status: DC | PRN
  Administered 2021-08-26: 80 ug via INTRAVENOUS
  Administered 2021-08-26: 120 ug via INTRAVENOUS
  Administered 2021-08-26: 80 ug via INTRAVENOUS

## 2021-08-26 MED ORDER — FENTANYL CITRATE PF 50 MCG/ML IJ SOSY: 25.0000 ug | PREFILLED_SYRINGE | INTRAMUSCULAR | Status: DC | PRN

## 2021-08-26 MED ORDER — DEXAMETHASONE SODIUM PHOSPHATE 10 MG/ML IJ SOLN
INTRAMUSCULAR | Status: AC
  Filled 2021-08-26: qty 1

## 2021-08-26 MED ORDER — PROPOFOL 10 MG/ML IV BOLUS
INTRAVENOUS | Status: AC
  Filled 2021-08-26: qty 20

## 2021-08-26 MED ORDER — ONDANSETRON HCL 4 MG PO TABS: 4.0000 mg | ORAL_TABLET | Freq: Four times a day (QID) | ORAL | Status: DC | PRN

## 2021-08-26 MED ORDER — OXYCODONE HCL 5 MG/5ML PO SOLN: 5.0000 mg | Freq: Once | ORAL | Status: DC | PRN

## 2021-08-26 MED ORDER — METHOCARBAMOL 500 MG PO TABS
500.0000 mg | ORAL_TABLET | Freq: Four times a day (QID) | ORAL | Status: DC | PRN
Start: 2021-08-26 — End: 2021-08-27
  Administered 2021-08-26: 500 mg via ORAL
  Filled 2021-08-26: qty 1

## 2021-08-26 MED ORDER — PHENYLEPHRINE HCL (PRESSORS) 10 MG/ML IV SOLN
INTRAVENOUS | Status: AC
  Filled 2021-08-26: qty 1

## 2021-08-26 MED ORDER — TRANEXAMIC ACID-NACL 1000-0.7 MG/100ML-% IV SOLN
1000.0000 mg | INTRAVENOUS | Status: AC
  Administered 2021-08-26: 1000 mg via INTRAVENOUS
  Filled 2021-08-26: qty 100

## 2021-08-26 MED ORDER — TOBRAMYCIN SULFATE 1.2 G IJ SOLR
INTRAMUSCULAR | Status: DC | PRN
  Administered 2021-08-26: 1.2 g

## 2021-08-26 MED ORDER — AMLODIPINE BESYLATE 5 MG PO TABS
5.0000 mg | ORAL_TABLET | Freq: Every day | ORAL | Status: DC
  Administered 2021-08-27: 5 mg via ORAL
  Filled 2021-08-26: qty 1

## 2021-08-26 MED ORDER — ATORVASTATIN CALCIUM 20 MG PO TABS
20.0000 mg | ORAL_TABLET | Freq: Every evening | ORAL | Status: DC
  Administered 2021-08-26: 20 mg via ORAL
  Filled 2021-08-26: qty 1

## 2021-08-26 MED ORDER — ZOLPIDEM TARTRATE 5 MG PO TABS: 5.0000 mg | ORAL_TABLET | Freq: Every evening | ORAL | Status: DC | PRN

## 2021-08-26 MED ORDER — GABAPENTIN 300 MG PO CAPS
300.0000 mg | ORAL_CAPSULE | Freq: Once | ORAL | Status: AC
  Administered 2021-08-26: 300 mg via ORAL
  Filled 2021-08-26: qty 1

## 2021-08-26 MED ORDER — SUGAMMADEX SODIUM 200 MG/2ML IV SOLN
INTRAVENOUS | Status: DC | PRN
  Administered 2021-08-26: 200 mg via INTRAVENOUS

## 2021-08-26 MED ORDER — DOCUSATE SODIUM 100 MG PO CAPS
100.0000 mg | ORAL_CAPSULE | Freq: Two times a day (BID) | ORAL | Status: DC
  Administered 2021-08-26 – 2021-08-27 (×2): 100 mg via ORAL
  Filled 2021-08-26 (×3): qty 1

## 2021-08-26 MED ORDER — HYDROMORPHONE HCL 1 MG/ML IJ SOLN
0.5000 mg | INTRAMUSCULAR | Status: DC | PRN
  Filled 2021-08-26: qty 1

## 2021-08-26 MED ORDER — FENTANYL CITRATE PF 50 MCG/ML IJ SOSY: 50.0000 ug | PREFILLED_SYRINGE | INTRAMUSCULAR | Status: DC

## 2021-08-26 MED ORDER — ROCURONIUM BROMIDE 10 MG/ML (PF) SYRINGE
PREFILLED_SYRINGE | INTRAVENOUS | Status: DC | PRN
  Administered 2021-08-26: 60 mg via INTRAVENOUS

## 2021-08-26 MED ORDER — ONDANSETRON HCL 4 MG/2ML IJ SOLN
INTRAMUSCULAR | Status: AC
  Filled 2021-08-26: qty 2

## 2021-08-26 MED ORDER — BISOPROLOL FUMARATE 5 MG PO TABS
10.0000 mg | ORAL_TABLET | Freq: Every day | ORAL | Status: DC
  Administered 2021-08-27: 10 mg via ORAL
  Filled 2021-08-26: qty 2

## 2021-08-26 MED ORDER — METHOCARBAMOL 1000 MG/10ML IJ SOLN
500.0000 mg | Freq: Four times a day (QID) | INTRAVENOUS | Status: DC | PRN
  Filled 2021-08-26: qty 5

## 2021-08-26 MED ORDER — DIPHENHYDRAMINE HCL 12.5 MG/5ML PO ELIX: 12.5000 mg | ORAL_SOLUTION | ORAL | Status: DC | PRN

## 2021-08-26 MED ORDER — ACETAMINOPHEN 500 MG PO TABS
1000.0000 mg | ORAL_TABLET | Freq: Four times a day (QID) | ORAL | Status: DC
  Administered 2021-08-26 – 2021-08-27 (×3): 1000 mg via ORAL
  Filled 2021-08-26 (×3): qty 2

## 2021-08-26 MED ORDER — PHENOL 1.4 % MT LIQD: 1.0000 | OROMUCOSAL | Status: DC | PRN

## 2021-08-26 MED ORDER — ACETAMINOPHEN 500 MG PO TABS: 1000.0000 mg | ORAL_TABLET | Freq: Once | ORAL | Status: DC

## 2021-08-26 MED ORDER — CHLORHEXIDINE GLUCONATE 0.12 % MT SOLN
15.0000 mL | Freq: Once | OROMUCOSAL | Status: AC
  Administered 2021-08-26: 15 mL via OROMUCOSAL

## 2021-08-26 MED ORDER — LIDOCAINE 2% (20 MG/ML) 5 ML SYRINGE
INTRAMUSCULAR | Status: DC | PRN
  Administered 2021-08-26: 40 mg via INTRAVENOUS

## 2021-08-26 MED ORDER — SODIUM CHLORIDE 0.9 % IR SOLN
Status: DC | PRN
  Administered 2021-08-26: 3000 mL

## 2021-08-26 MED ORDER — VANCOMYCIN HCL 1000 MG IV SOLR
INTRAVENOUS | Status: AC
  Filled 2021-08-26: qty 20

## 2021-08-26 MED ORDER — ONDANSETRON HCL 4 MG/2ML IJ SOLN: 4.0000 mg | Freq: Once | INTRAMUSCULAR | Status: DC | PRN

## 2021-08-26 MED ORDER — VANCOMYCIN HCL 1500 MG/300ML IV SOLN
1500.0000 mg | INTRAVENOUS | Status: AC
  Administered 2021-08-26: 1500 mg via INTRAVENOUS
  Filled 2021-08-26: qty 300

## 2021-08-26 MED ORDER — SODIUM CHLORIDE 0.9 % IV SOLN
2.0000 g | INTRAVENOUS | Status: DC
  Administered 2021-08-26: 2 g via INTRAVENOUS
  Filled 2021-08-26 (×3): qty 20

## 2021-08-26 MED ORDER — MIDAZOLAM HCL 2 MG/2ML IJ SOLN: 1.0000 mg | INTRAMUSCULAR | Status: DC

## 2021-08-26 MED ORDER — LACTATED RINGERS IV SOLN: INTRAVENOUS | Status: DC

## 2021-08-26 MED ORDER — METOCLOPRAMIDE HCL 5 MG/ML IJ SOLN: 5.0000 mg | Freq: Three times a day (TID) | INTRAMUSCULAR | Status: DC | PRN

## 2021-08-26 MED ORDER — BUPIVACAINE HCL (PF) 0.5 % IJ SOLN
INTRAMUSCULAR | Status: DC | PRN
Start: 2021-08-26 — End: 2021-08-26
  Administered 2021-08-26: 15 mL via PERINEURAL

## 2021-08-26 MED ORDER — 0.9 % SODIUM CHLORIDE (POUR BTL) OPTIME
TOPICAL | Status: DC | PRN
  Administered 2021-08-26: 1000 mL

## 2021-08-26 MED ORDER — ONDANSETRON HCL 4 MG/2ML IJ SOLN: 4.0000 mg | Freq: Four times a day (QID) | INTRAMUSCULAR | Status: DC | PRN

## 2021-08-26 MED ORDER — VANCOMYCIN HCL IN DEXTROSE 1-5 GM/200ML-% IV SOLN
1000.0000 mg | Freq: Two times a day (BID) | INTRAVENOUS | Status: AC
  Administered 2021-08-27: 1000 mg via INTRAVENOUS
  Filled 2021-08-26: qty 200

## 2021-08-26 MED ORDER — FENTANYL CITRATE (PF) 100 MCG/2ML IJ SOLN
INTRAMUSCULAR | Status: DC | PRN
  Administered 2021-08-26: 100 ug via INTRAVENOUS

## 2021-08-26 MED ORDER — VANCOMYCIN HCL 1 G IV SOLR
INTRAVENOUS | Status: DC | PRN
  Administered 2021-08-26: 1000 mg

## 2021-08-26 MED ORDER — OXYCODONE HCL 5 MG PO TABS: 5.0000 mg | ORAL_TABLET | Freq: Once | ORAL | Status: DC | PRN

## 2021-08-26 MED ORDER — ORAL CARE MOUTH RINSE: 15.0000 mL | Freq: Once | OROMUCOSAL | Status: AC

## 2021-08-26 MED ORDER — ONDANSETRON HCL 4 MG/2ML IJ SOLN
INTRAMUSCULAR | Status: DC | PRN
  Administered 2021-08-26: 4 mg via INTRAVENOUS

## 2021-08-26 MED ORDER — PROPOFOL 10 MG/ML IV BOLUS
INTRAVENOUS | Status: DC | PRN
  Administered 2021-08-26: 150 mg via INTRAVENOUS

## 2021-08-26 MED ORDER — OXYCODONE HCL 5 MG PO TABS
10.0000 mg | ORAL_TABLET | ORAL | Status: DC | PRN
  Filled 2021-08-26: qty 3

## 2021-08-26 MED ORDER — EPHEDRINE SULFATE-NACL 50-0.9 MG/10ML-% IV SOSY
PREFILLED_SYRINGE | INTRAVENOUS | Status: DC | PRN
Start: 2021-08-26 — End: 2021-08-26
  Administered 2021-08-26: 5 mg via INTRAVENOUS
  Administered 2021-08-26: 10 mg via INTRAVENOUS

## 2021-08-26 MED ORDER — BISACODYL 10 MG RE SUPP: 10.0000 mg | Freq: Every day | RECTAL | Status: DC | PRN

## 2021-08-26 MED ORDER — DEXAMETHASONE SODIUM PHOSPHATE 10 MG/ML IJ SOLN
INTRAMUSCULAR | Status: DC | PRN
  Administered 2021-08-26: 10 mg via INTRAVENOUS

## 2021-08-26 MED ORDER — MIDAZOLAM HCL 2 MG/2ML IJ SOLN
INTRAMUSCULAR | Status: AC
  Administered 2021-08-26: 1 mg via INTRAVENOUS
  Filled 2021-08-26: qty 2

## 2021-08-26 MED ORDER — MIDAZOLAM HCL 2 MG/2ML IJ SOLN
INTRAMUSCULAR | Status: AC
  Filled 2021-08-26: qty 2

## 2021-08-26 MED ORDER — POLYETHYLENE GLYCOL 3350 17 G PO PACK: 17.0000 g | PACK | Freq: Every day | ORAL | Status: DC | PRN

## 2021-08-26 MED ORDER — ROCURONIUM BROMIDE 10 MG/ML (PF) SYRINGE
PREFILLED_SYRINGE | INTRAVENOUS | Status: AC
  Filled 2021-08-26: qty 10

## 2021-08-26 MED ORDER — FENTANYL CITRATE (PF) 100 MCG/2ML IJ SOLN
INTRAMUSCULAR | Status: AC
  Filled 2021-08-26: qty 2

## 2021-08-26 MED ORDER — FENTANYL CITRATE PF 50 MCG/ML IJ SOSY
PREFILLED_SYRINGE | INTRAMUSCULAR | Status: AC
  Administered 2021-08-26: 50 ug via INTRAVENOUS
  Filled 2021-08-26: qty 2

## 2021-08-26 MED ORDER — OXYCODONE HCL 5 MG PO TABS
5.0000 mg | ORAL_TABLET | ORAL | Status: DC | PRN
  Administered 2021-08-26 – 2021-08-27 (×4): 10 mg via ORAL
  Filled 2021-08-26 (×3): qty 2

## 2021-08-26 MED ORDER — ACETAMINOPHEN 500 MG PO TABS
1000.0000 mg | ORAL_TABLET | Freq: Once | ORAL | Status: DC
  Filled 2021-08-26: qty 2

## 2021-08-26 MED ORDER — STERILE WATER FOR IRRIGATION IR SOLN
Status: DC | PRN
  Administered 2021-08-26: 2000 mL

## 2021-08-26 MED ORDER — LISINOPRIL 20 MG PO TABS
40.0000 mg | ORAL_TABLET | Freq: Every day | ORAL | Status: DC
  Administered 2021-08-26 – 2021-08-27 (×2): 40 mg via ORAL
  Filled 2021-08-26 (×2): qty 2

## 2021-08-26 MED ORDER — MENTHOL 3 MG MT LOZG: 1.0000 | LOZENGE | OROMUCOSAL | Status: DC | PRN

## 2021-08-26 SURGICAL SUPPLY — 95 items
BAG COUNTER SPONGE SURGICOUNT (BAG) ×2 IMPLANT
BLADE EXTENDED COATED 6.5IN (ELECTRODE) ×1 IMPLANT
BLADE SAW SAG 73X25 THK (BLADE) ×1
BLADE SAW SGTL 73X25 THK (BLADE) ×1 IMPLANT
BLADE SURG SZ10 CARB STEEL (BLADE) ×2 IMPLANT
BNDG ELASTIC 4X5.8 VLCR STR LF (GAUZE/BANDAGES/DRESSINGS) ×2 IMPLANT
BNDG ELASTIC 6X5.8 VLCR STR LF (GAUZE/BANDAGES/DRESSINGS) ×2 IMPLANT
BNDG ESMARK 4X9 LF (GAUZE/BANDAGES/DRESSINGS) IMPLANT
BONE CEMENT GENTAMICIN (Cement) ×2 IMPLANT
CEMENT BONE GENTAMICIN 40 (Cement) IMPLANT
CHLORAPREP W/TINT 26 (MISCELLANEOUS) ×4 IMPLANT
CLSR STERI-STRIP ANTIMIC 1/2X4 (GAUZE/BANDAGES/DRESSINGS) ×2 IMPLANT
CNTNR URN SCR LID CUP LEK RST (MISCELLANEOUS) IMPLANT
CONT SPEC 4OZ STRL OR WHT (MISCELLANEOUS) ×1
COOLER ICEMAN CLASSIC (MISCELLANEOUS) IMPLANT
COVER BACK TABLE 60X90IN (DRAPES) ×2 IMPLANT
COVER SURGICAL LIGHT HANDLE (MISCELLANEOUS) ×2 IMPLANT
CUBES CANC 30CC PCANCUBE30 (Bone Implant) ×2 IMPLANT
CUFF TOURN SGL QUICK 24 (TOURNIQUET CUFF)
CUFF TOURN SGL QUICK 34 (TOURNIQUET CUFF)
CUFF TRNQT CYL 24X4X16.5-23 (TOURNIQUET CUFF) IMPLANT
CUFF TRNQT CYL 34X4.125X (TOURNIQUET CUFF) IMPLANT
DRAPE C-ARM 42X120 X-RAY (DRAPES) ×1 IMPLANT
DRAPE INCISE IOBAN 66X45 STRL (DRAPES) ×2 IMPLANT
DRAPE ORTHO SPLIT 77X108 STRL (DRAPES) ×2
DRAPE POUCH INSTRU U-SHP 10X18 (DRAPES) IMPLANT
DRAPE SHEET LG 3/4 BI-LAMINATE (DRAPES) ×2 IMPLANT
DRAPE SURG ORHT 6 SPLT 77X108 (DRAPES) ×2 IMPLANT
DRAPE TOP 10253 STERILE (DRAPES) ×2 IMPLANT
DRESSING PEEL AND PLAC PRVNA20 (GAUZE/BANDAGES/DRESSINGS) IMPLANT
DRSG AQUACEL AG ADV 3.5X 4 (GAUZE/BANDAGES/DRESSINGS) ×2 IMPLANT
DRSG AQUACEL AG ADV 3.5X 6 (GAUZE/BANDAGES/DRESSINGS) ×2 IMPLANT
DRSG PEEL AND PLACE PREVENA 20 (GAUZE/BANDAGES/DRESSINGS) ×2
ELECT BLADE TIP CTD 4 INCH (ELECTRODE) ×2 IMPLANT
ELECT REM PT RETURN 15FT ADLT (MISCELLANEOUS) ×2 IMPLANT
FACESHIELD WRAPAROUND (MASK) ×2 IMPLANT
FACESHIELD WRAPAROUND OR TEAM (MASK) ×1 IMPLANT
GLOVE SRG 8 PF TXTR STRL LF DI (GLOVE) ×1 IMPLANT
GLOVE SURG ENC MOIS LTX SZ6.5 (GLOVE) ×4 IMPLANT
GLOVE SURG NEOPR MICRO LF SZ8 (GLOVE) ×4 IMPLANT
GLOVE SURG UNDER LTX SZ6.5 (GLOVE) ×2 IMPLANT
GLOVE SURG UNDER POLY LF SZ6.5 (GLOVE) ×2 IMPLANT
GLOVE SURG UNDER POLY LF SZ8 (GLOVE) ×1
GOWN STRL REUS W/ TWL LRG LVL3 (GOWN DISPOSABLE) ×1 IMPLANT
GOWN STRL REUS W/TWL LRG LVL3 (GOWN DISPOSABLE) ×5 IMPLANT
GOWN STRL REUS W/TWL XL LVL3 (GOWN DISPOSABLE) ×2 IMPLANT
GRAFT BNE CANC CUBE 1X1X1 30CC (Bone Implant) IMPLANT
HANDPIECE INTERPULSE COAX TIP (DISPOSABLE) ×1
HEAD HUM AEQ 54X4 OS 27 (Head) ×1 IMPLANT
KIT BASIN OR (CUSTOM PROCEDURE TRAY) ×2 IMPLANT
KIT DRSG PREVENA PLUS 7DAY 125 (MISCELLANEOUS) ×1 IMPLANT
KIT STABILIZATION SHOULDER (MISCELLANEOUS) ×2 IMPLANT
KIT TURNOVER KIT A (KITS) IMPLANT
LOOP VESSEL MAXI BLUE (MISCELLANEOUS) IMPLANT
MANIFOLD NEPTUNE II (INSTRUMENTS) ×2 IMPLANT
NDL HYPO 25X1 1.5 SAFETY (NEEDLE) IMPLANT
NDL MAYO CATGUT SZ4 TPR NDL (NEEDLE) IMPLANT
NEEDLE HYPO 25X1 1.5 SAFETY (NEEDLE) IMPLANT
NEEDLE MAYO CATGUT SZ4 (NEEDLE) IMPLANT
NS IRRIG 1000ML POUR BTL (IV SOLUTION) ×2 IMPLANT
PACK SHOULDER (CUSTOM PROCEDURE TRAY) ×2 IMPLANT
PAD COLD SHLDR WRAP-ON (PAD) IMPLANT
PUTTY DBM STAGRAFT PLUS 5CC (Putty) ×1 IMPLANT
RESTRAINT HEAD UNIVERSAL NS (MISCELLANEOUS) ×2 IMPLANT
SET BERKELEY SUCTION TUBING (SUCTIONS) ×1 IMPLANT
SET HNDPC FAN SPRY TIP SCT (DISPOSABLE) ×1 IMPLANT
SLEEVE SCD COMPRESS KNEE MED (STOCKING) IMPLANT
SLING ULTRA II L (ORTHOPEDIC SUPPLIES) IMPLANT
SLING ULTRA III MED (ORTHOPEDIC SUPPLIES) IMPLANT
SMARTMIX MINI TOWER (MISCELLANEOUS)
SPLINT CAST 1 STEP 5X30 WHT (MISCELLANEOUS) ×40 IMPLANT
SPONGE T-LAP 18X18 ~~LOC~~+RFID (SPONGE) ×2 IMPLANT
SPONGE T-LAP 4X18 ~~LOC~~+RFID (SPONGE) ×4 IMPLANT
STRIP CLOSURE SKIN 1/2X4 (GAUZE/BANDAGES/DRESSINGS) ×2 IMPLANT
SUCTION FRAZIER HANDLE 10FR (MISCELLANEOUS) ×1
SUCTION FRAZIER HANDLE 12FR (TUBING) ×1
SUCTION TUBE FRAZIER 10FR DISP (MISCELLANEOUS) ×1 IMPLANT
SUCTION TUBE FRAZIER 12FR DISP (TUBING) ×1 IMPLANT
SUT ETHIBOND 2 V 37 (SUTURE) ×2 IMPLANT
SUT ETHIBOND NAB CT1 #1 30IN (SUTURE) ×2 IMPLANT
SUT ETHILON 2 0 PS N (SUTURE) IMPLANT
SUT FIBERWIRE #5 38 CONV NDL (SUTURE) ×6
SUT MNCRL AB 4-0 PS2 18 (SUTURE) ×2 IMPLANT
SUT MON AB 3-0 SH 27 (SUTURE) ×1
SUT MON AB 3-0 SH27 (SUTURE) ×1 IMPLANT
SUT VIC AB 0 CT1 27 (SUTURE) ×1
SUT VIC AB 0 CT1 27XBRD ANBCTR (SUTURE) ×1 IMPLANT
SUT VIC AB 0 CT1 36 (SUTURE) IMPLANT
SUT VIC AB 3-0 SH 27 (SUTURE) ×1
SUT VIC AB 3-0 SH 27X BRD (SUTURE) ×1 IMPLANT
SUTURE FIBERWR #5 38 CONV NDL (SUTURE) IMPLANT
TOWEL OR 17X26 10 PK STRL BLUE (TOWEL DISPOSABLE) ×2 IMPLANT
TOWER SMARTMIX MINI (MISCELLANEOUS) IMPLANT
TUBE SUCTION HIGH CAP CLEAR NV (SUCTIONS) ×3 IMPLANT
WATER STERILE IRR 1000ML POUR (IV SOLUTION) ×2 IMPLANT

## 2021-08-26 NOTE — Anesthesia Procedure Notes (Signed)
Anesthesia Regional Block: Interscalene brachial plexus block   Pre-Anesthetic Checklist: , timeout performed,  Correct Patient, Correct Site, Correct Laterality,  Correct Procedure, Correct Position, site marked,  Risks and benefits discussed,  Surgical consent,  Pre-op evaluation,  At surgeon's request and post-op pain management  Laterality: Left  Prep: chloraprep       Needles:  Injection technique: Single-shot  Needle Type: Echogenic Needle     Needle Length: 5cm  Needle Gauge: 21     Additional Needles:   Narrative:  Start time: 08/26/2021 2:07 PM End time: 08/26/2021 2:10 PM Injection made incrementally with aspirations every 5 mL.  Performed by: Personally  Anesthesiologist: Audry Pili, MD  Additional Notes: No pain on injection. No increased resistance to injection. Injection made in 5cc increments. Good needle visualization. Patient tolerated the procedure well.

## 2021-08-26 NOTE — Anesthesia Postprocedure Evaluation (Signed)
Anesthesia Post Note  Patient: Sean Garcia  Procedure(s) Performed: TOTAL SHOULDER ARTHROPLASTY/HEMI (Left: Shoulder) HARDWARE REMOVAL (Left: Shoulder) SYNOVECTOMY (Left: Shoulder)     Patient location during evaluation: PACU Anesthesia Type: General Level of consciousness: awake and alert Pain management: pain level controlled Vital Signs Assessment: post-procedure vital signs reviewed and stable Respiratory status: spontaneous breathing, nonlabored ventilation and respiratory function stable Cardiovascular status: stable and blood pressure returned to baseline Anesthetic complications: no   No notable events documented.  Last Vitals:  Vitals:   08/26/21 1830 08/26/21 1845  BP: 140/90 118/84  Pulse: 75 73  Resp: 17 17  Temp:  36.5 C  SpO2: 94% 95%    Last Pain:  Vitals:   08/26/21 1926  TempSrc:   PainSc: [place]

## 2021-08-26 NOTE — Progress Notes (Signed)
Assisted Dr. Fransisco Beau with left, ultrasound guided, interscalene  block. Side rails up, monitors on throughout procedure. See vital signs in flow sheet. Tolerated Procedure well.

## 2021-08-26 NOTE — Op Note (Signed)
Orthopaedic Surgery Operative Note (CSN: [id number])  ADOLPHE FORTUNATO  1951-02-17 Date of Surgery: 08/26/2021   Diagnoses:  Left failed reverse total shoulder arthroplasty, infection  Procedure: Left revision Shoulder Arthroplasty-removal of glenoid implants and humeral head with retention of humeral stem.  Antibiotic spacer cement and hemiarthroplasty head placed Bone grafting with allograft of complex glenoid defect Complex synovectomy with synovial biopsy   Operative Finding Successful completion of planned procedure.  Patient superior and anterior cuff for attritional loss.  The soft tissues on the approach were relatively pliable.  The humeral stem was extremely well fixed and we felt that removal of the stem would likely require an osteotomy and would put the patient at high risk for failure and likely lead to a higher risk of subsidence with an implant spacer in place.  Based on this we felt that it was appropriate to perform a synovectomy and leave the humeral stem.  The glenoid was extraordinarily difficult to deal with.  Essentially the patient had eroded away the 12:00 to 4 o'clock position on the glenoid face.  There was a cavitary defect with a central screw had previously been placed.  The peripheral screws had lost fixation and had essentially created a cavitary posterior defect as well.  We considered structural bone graft however due to the complex nature of the defect we felt that the fixation of a structural bone graft was likely to fail.  This would have left more instrumented bone and less bone stock for eventual implantation of potentially a custom implant.  We used cancellous cubes which were impacted into place into the defects to obtain fit.  We then used DBM putty to fill any crevices that were present.  Based on this we feel that repeat CTs at 3 and likely 5 months may be necessary to check for bone graft healing.  We will get a CT while the patient is in the hospital in  order to assess our bone graft natively.  The patient may require a custom glenoid implant with scapular and acromial based fixation however it also may be possible if the bone graft incorporates appropriately then long ingrowth post may be appropriate with relatively normal implants.  Infectious disease was consulted for antibiotic management options.  If the patient opts for reimplantation there is a possibility of retaining [place] humeral stem however he may have to remove it in order to obtain length.  At that time an osteotomy versus Tornier custom trefines and osteotomes may be necessary.  Post-operative plan: The patient will be NWB in sling.  The patient will be will be admitted to observation due to medical complexity, monitoring and pain management.  In addition infectious disease will see the patient in order to help facilitate an antibiotic plan.  DVT prophylaxis Aspirin 81 mg twice daily for 6 weeks.  Pain control with PRN pain medication preferring oral medicines.  Follow up plan will be scheduled in approximately 7 days for incision check and XR.  Physical therapy to start after 6 weeks, sling to be used for 6 weeks to allow bone graft incorporation  Implants: Tornier 54 x 27 hemiarthroplasty head, cancellous cubes and DBM putty  Post-Op Diagnosis: Same Surgeons:Primary: Hiram Gash, MD Assistants:Caroline McBane PA-C Location: [facility] ROOM 10 Anesthesia: General with Exparel Interscalene Antibiotics: Vancomycin 1.5 IV, Vancomycin 1000mg  tobramycin 1.2 g locally Tourniquet time: None Estimated Blood Loss: 867 Complications: None Specimens: 3 from shoulder hold for 3 weeks to rule out C acnes Implants: Implant Name  Type Inv. Item Serial No. Manufacturer Lot No. LRB No. Used Action  CUBES CANC 30CC [id number] - [id number] Bone Implant CUBES CANC 30CC [id number] [employer]  Left 1 Implanted  PUTTY DBM STAGRAFT PLUS 5CC - [id number] Putty PUTTY DBM STAGRAFT PLUS 5CC   ZIMMER [employer]ON(ORTH,TRAU,BIO,SG) [id number] Left 1 Implanted  BONE CEMENT GENTAMICIN - [id number] Cement BONE CEMENT Delane Ginger [id number] Left 1 Implanted    Indications for Surgery:   HILTON SAEPHAN is a 71 y.o. male with extensive surgical history including primary total shoulder arthroplasty, revision to reverse shoulder arthroplasty with known loose implants for 6 months.  CT demonstrated significant glenoid bone loss and preoperative arthroscopic biopsy demonstrated signs of infection.  Benefits and risks of operative and nonoperative management were discussed prior to surgery with patient/guardian(s) and informed consent form was completed.  Infection and need for further surgery were discussed as was prosthetic stability and cuff issues.  We additionally specifically discussed risks of axillary nerve injury, infection, periprosthetic fracture, continued pain and longevity of implants prior to beginning procedure.      Procedure:   The patient was identified in the preoperative holding area where the surgical site was marked. Block placed by anesthesia with exparel.  The patient was taken to the OR where a procedural timeout was called and the above noted anesthesia was induced.  The patient was positioned beachchair on allen table with spider arm positioner.  Preoperative antibiotics were dosed.  The patient's left shoulder was prepped and draped in the usual sterile fashion.  A second preoperative timeout was called.       We used the previously made incision going through skin sharply achieving hemostasis as we progressed.  We identified the deltopectoral interval that was marked with Ethibond sutures.  Remove the sutures and proceeded into the deltopectoral interval.  We placed blunt retractors and noted that the anatomy was quite distorted secondary to the patient's failed glenoid implant.  We stayed away from the axillary nerve and the axillary recess inferior to the conjoined  tendon as it was quite scarred down.  We are able to visualize the axillary nerve on the deltoid side and again protected this with blunt retractors.  We identified the remnant of the subscapularis had been repaired prior.  Removed her sutures and the lifted did the subscapularis remnant as well as the capsule subperiosteally.  We placed stay sutures in the form of Ethibonds to hold and retract this tissue.  We peeled around the inferior aspect of the calcar and mobilized the tissue exposing the humeral component.  We dislocated the shoulder and remove the humeral tray and polyethylene.  There was significant amount of inferior wear of the polyethylene as it is likely been articulating with the bone.  There was not purulent material below the baseplate however there was fibrinous material that did not appear healthy.  We removed this and take it for culture.  We then attempted to remove the stem however it was extremely well fixed we felt that removal would compromise the bone quality and provide the patient little benefit.  We then placed blunt retractors and carefully expose the glenoid.  It was quite difficult to expose the glenoid as the implant had shifted posteriorly and was clearly loose floating within the soft tissues of the shoulder.  There was some soft tissue adherent to the implant however we are able to remove the glenoid baseplate and sphere without instrumentation essentially.    After removal of  the implants we noted that the glenoid was quite atypical.  As described by a clock face the patient had lost essentially all of the vault and bone from the 12:00 to 4 o'clock position with a cavitary central lesion consistent with a center screw.  There was a partially contained lesion posterior superior.  We considered her options extensively however felt that due to the patient's bone loss a structural bone graft would have a high likelihood of failure as the contours of this lesion were such that  it would be difficult to get this to heal in.  We felt instead that more contoured graft in the form of cancellous cubes and DBM putty would have a better chance of restoring some of the glenoid anatomy admitting that we may not be able to restore all of it.  We spent a great deal of time performing a synovectomy of the capsule as well as the area of the bone of the glenoid.  We used a curette as well as a rondure to excisionally debride fibrinous appearing tissue.  There was clearly purulent material below the implant.  We irrigated copiously and placed Aricept solution.  After an appropriate amount of time we irrigated again.  Once we had area with bleeding bone was stimulated using a curette.  At that point we were able to use a bone tamp as well as cannot large cancellous cubes to shape these and mount them into place obtaining press-fit.  We then used DBM putty to backfill to ensure that we had good fill of bone graft.  Once we had restored the glenoid anatomy to near normal we turned attention back to the humerus.  We irrigated again and trialed noting that a 54 x 27 humeral head would provide space and hopefully preserve space for articulation of the acromion and eventual potential reverse shoulder arthroplasty reimplantation.  We mixed cement on the back table and placed FiberWire sutures around the existing stem.  We then made a ring of cement that we placed below the humeral head.  We then impacted the head in place ensuring that we had good fit.  Once this was complete we waited 15 minutes for the cement to harden before reducing the shoulder and having a reasonable articulation.  The bone graft appeared to be resting nicely against the new humeral head.  We used three #5 FiberWire sutures to try and repair the anterior capsule and subscapularis remnant.  It was clearly under a lot of tension and if we were able to obtain some anterior restraint in the humeral head be less likely to  dislocate.  We irrigated again and placed local vancomycin and tobramycin powder.  Specimens were passed off the back table.  We closed with nonabsorbable suture in the form of 2-0 nylon.  Prevena wound VAC was placed.  Sterile dressing and sling were placed and patient was woken taken to PACU in stable condition.   Noemi Chapel, PA-C, present and scrubbed throughout the case, critical for completion in a timely fashion, and for retraction, instrumentation, closure.

## 2021-08-26 NOTE — Transfer of Care (Signed)
Immediate Anesthesia Transfer of Care Note  Patient: Sean Garcia  Procedure(s) Performed: TOTAL SHOULDER ARTHROPLASTY/HEMI (Left: Shoulder) HARDWARE REMOVAL (Left: Shoulder) SYNOVECTOMY (Left: Shoulder)  Patient Location: PACU  Anesthesia Type:GA combined with regional for post-op pain  Level of Consciousness: awake, alert , oriented, drowsy and patient cooperative  Airway & Oxygen Therapy: Patient Spontanous Breathing and Patient connected to face mask oxygen  Post-op Assessment: Report given to RN and Post -op Vital signs reviewed and stable  Post vital signs: Reviewed and stable  Last Vitals:  Vitals Value Taken Time  BP 131/94 08/26/21 1810  Temp    Pulse 79 08/26/21 1812  Resp 21 08/26/21 1812  SpO2 96 % 08/26/21 1812  Vitals shown include unvalidated device data.  Last Pain:  Vitals:   08/26/21 1255  TempSrc:   PainSc: 3          Complications: No notable events documented.

## 2021-08-26 NOTE — Anesthesia Preprocedure Evaluation (Addendum)
Anesthesia Evaluation  Patient identified by MRN, date of birth, ID band Patient awake    Reviewed: Allergy & Precautions, NPO status , Patient's Chart, lab work & pertinent test results, reviewed documented beta blocker date and time   History of Anesthesia Complications Negative for: history of anesthetic complications  Airway Mallampati: II  TM Distance: >3 FB Neck ROM: Full    Dental  (+) Dental Advisory Given   Pulmonary former smoker,    Pulmonary exam normal        Cardiovascular hypertension, Pt. on medications and Pt. on home beta blockers Normal cardiovascular exam     Neuro/Psych negative neurological ROS  negative psych ROS   GI/Hepatic Neg liver ROS, GERD  Controlled,  Endo/Other   Obesity   Renal/GU negative Renal ROS     Musculoskeletal  (+) Arthritis , Osteoarthritis,   Gout    Abdominal   Peds  Hematology  Plt 133k    Anesthesia Other Findings   Reproductive/Obstetrics                            Anesthesia Physical Anesthesia Plan  ASA: 2  Anesthesia Plan: General   Post-op Pain Management: Regional block and Tylenol PO (pre-op)   Induction: Intravenous  PONV Risk Score and Plan: 2 and Treatment may vary due to age or medical condition, Ondansetron, Dexamethasone and Midazolam  Airway Management Planned: Oral ETT  Additional Equipment: None  Intra-op Plan:   Post-operative Plan: Extubation in OR  Informed Consent: I have reviewed the patients History and Physical, chart, labs and discussed the procedure including the risks, benefits and alternatives for the proposed anesthesia with the patient or authorized representative who has indicated his/her understanding and acceptance.     Dental advisory given  Plan Discussed with: CRNA and Anesthesiologist  Anesthesia Plan Comments:        Anesthesia Quick Evaluation

## 2021-08-26 NOTE — Anesthesia Procedure Notes (Signed)
Procedure Name: Intubation Date/Time: 08/26/2021 3:29 PM Performed by: Raenette Rover, CRNA Pre-anesthesia Checklist: Patient identified, Emergency Drugs available, Suction available and Patient being monitored Patient Re-evaluated:Patient Re-evaluated prior to induction Oxygen Delivery Method: Circle system utilized Preoxygenation: Pre-oxygenation with 100% oxygen Induction Type: IV induction Ventilation: Mask ventilation without difficulty Laryngoscope Size: Mac and 4 Grade View: Grade I Tube type: Oral Tube size: 7.5 mm Number of attempts: 1 Airway Equipment and Method: Stylet Placement Confirmation: ETT inserted through vocal cords under direct vision, positive ETCO2 and breath sounds checked- equal and bilateral Secured at: 22 cm Tube secured with: Tape Dental Injury: Teeth and Oropharynx as per pre-operative assessment

## 2021-08-26 NOTE — Plan of Care (Signed)
  Problem: Activity: Goal: Risk for activity intolerance will decrease Outcome: Progressing   Problem: Pain Managment: Goal: General experience of comfort will improve Outcome: Progressing   Problem: Safety: Goal: Ability to remain free from injury will improve Outcome: Progressing   

## 2021-08-27 ENCOUNTER — Inpatient Hospital Stay (HOSPITAL_COMMUNITY): Payer: Medicare Other

## 2021-08-27 ENCOUNTER — Observation Stay (HOSPITAL_COMMUNITY): Payer: Medicare Other

## 2021-08-27 ENCOUNTER — Observation Stay: Payer: Self-pay

## 2021-08-27 DIAGNOSIS — N529 Male erectile dysfunction, unspecified: Secondary | ICD-10-CM | POA: Diagnosis present

## 2021-08-27 DIAGNOSIS — T797XXA Traumatic subcutaneous emphysema, initial encounter: Secondary | ICD-10-CM | POA: Diagnosis not present

## 2021-08-27 DIAGNOSIS — Z807 Family history of other malignant neoplasms of lymphoid, hematopoietic and related tissues: Secondary | ICD-10-CM | POA: Diagnosis not present

## 2021-08-27 DIAGNOSIS — Z96612 Presence of left artificial shoulder joint: Secondary | ICD-10-CM

## 2021-08-27 DIAGNOSIS — M109 Gout, unspecified: Secondary | ICD-10-CM | POA: Diagnosis present

## 2021-08-27 DIAGNOSIS — E785 Hyperlipidemia, unspecified: Secondary | ICD-10-CM | POA: Diagnosis present

## 2021-08-27 DIAGNOSIS — M25412 Effusion, left shoulder: Secondary | ICD-10-CM | POA: Diagnosis not present

## 2021-08-27 DIAGNOSIS — J9811 Atelectasis: Secondary | ICD-10-CM | POA: Diagnosis not present

## 2021-08-27 DIAGNOSIS — Z8249 Family history of ischemic heart disease and other diseases of the circulatory system: Secondary | ICD-10-CM | POA: Diagnosis not present

## 2021-08-27 DIAGNOSIS — Z471 Aftercare following joint replacement surgery: Secondary | ICD-10-CM | POA: Diagnosis not present

## 2021-08-27 DIAGNOSIS — I10 Essential (primary) hypertension: Secondary | ICD-10-CM | POA: Diagnosis present

## 2021-08-27 DIAGNOSIS — Z87891 Personal history of nicotine dependence: Secondary | ICD-10-CM | POA: Diagnosis not present

## 2021-08-27 DIAGNOSIS — Z83438 Family history of other disorder of lipoprotein metabolism and other lipidemia: Secondary | ICD-10-CM | POA: Diagnosis not present

## 2021-08-27 DIAGNOSIS — T8459XA Infection and inflammatory reaction due to other internal joint prosthesis, initial encounter: Secondary | ICD-10-CM | POA: Diagnosis present

## 2021-08-27 DIAGNOSIS — Z20822 Contact with and (suspected) exposure to covid-19: Secondary | ICD-10-CM | POA: Diagnosis present

## 2021-08-27 DIAGNOSIS — Y831 Surgical operation with implant of artificial internal device as the cause of abnormal reaction of the patient, or of later complication, without mention of misadventure at the time of the procedure: Secondary | ICD-10-CM | POA: Diagnosis present

## 2021-08-27 LAB — CBC
HCT: 39.2 % (ref 39.0–52.0)
Hemoglobin: 12.6 g/dL — ABNORMAL LOW (ref 13.0–17.0)
MCH: 29.2 pg (ref 26.0–34.0)
MCHC: 32.1 g/dL (ref 30.0–36.0)
MCV: 90.7 fL (ref 80.0–100.0)
Platelets: 186 10*3/uL (ref 150–400)
RBC: 4.32 MIL/uL (ref 4.22–5.81)
RDW: 13.2 % (ref 11.5–15.5)
WBC: 10 10*3/uL (ref 4.0–10.5)
nRBC: 0 % (ref 0.0–0.2)

## 2021-08-27 LAB — BASIC METABOLIC PANEL
Anion gap: 7 (ref 5–15)
BUN: 19 mg/dL (ref 8–23)
CO2: 25 mmol/L (ref 22–32)
Calcium: 9.3 mg/dL (ref 8.9–10.3)
Chloride: 104 mmol/L (ref 98–111)
Creatinine, Ser: 0.89 mg/dL (ref 0.61–1.24)
GFR, Estimated: >60 mL/min (ref >60)
Glucose, Bld: 146 mg/dL — ABNORMAL HIGH (ref 70–99)
Potassium: 4.5 mmol/L (ref 3.5–5.1)
Sodium: 136 mmol/L (ref 135–145)

## 2021-08-27 MED ORDER — ONDANSETRON HCL 4 MG PO TABS: 4.0000 mg | ORAL_TABLET | Freq: Three times a day (TID) | ORAL | 0 refills | Status: AC | PRN

## 2021-08-27 MED ORDER — CHLORHEXIDINE GLUCONATE CLOTH 2 % EX PADS
6.0000 | MEDICATED_PAD | Freq: Every day | CUTANEOUS | Status: DC
  Administered 2021-08-27: 6 via TOPICAL

## 2021-08-27 MED ORDER — ACETAMINOPHEN 500 MG PO TABS
1000.0000 mg | ORAL_TABLET | Freq: Three times a day (TID) | ORAL | 0 refills | Status: AC
Start: 2021-08-27 — End: 2021-09-10

## 2021-08-27 MED ORDER — COLCHICINE 0.6 MG PO TABS
0.6000 mg | ORAL_TABLET | Freq: Two times a day (BID) | ORAL | Status: DC | PRN
  Administered 2021-08-27: 0.6 mg via ORAL
  Filled 2021-08-27: qty 1

## 2021-08-27 MED ORDER — CEFTRIAXONE IV (FOR PTA / DISCHARGE USE ONLY): 2.0000 g | INTRAVENOUS | 0 refills | Status: AC

## 2021-08-27 MED ORDER — SODIUM CHLORIDE 0.9% FLUSH
10.0000 mL | Freq: Two times a day (BID) | INTRAVENOUS | Status: DC
  Administered 2021-08-27: 10 mL

## 2021-08-27 MED ORDER — COLCHICINE 0.6 MG PO TABS: 0.6000 mg | ORAL_TABLET | Freq: Two times a day (BID) | ORAL | Status: DC

## 2021-08-27 MED ORDER — OXYCODONE HCL 5 MG PO TABS: ORAL_TABLET | ORAL | 0 refills | Status: AC

## 2021-08-27 MED ORDER — SODIUM CHLORIDE 0.9% FLUSH: 10.0000 mL | INTRAVENOUS | Status: DC | PRN

## 2021-08-27 NOTE — Discharge Instructions (Addendum)
Ophelia Charter MD, MPH Noemi Chapel, PA-C [place] [address], [address] [phone] (tel)   [phone] (fax)   POST-OPERATIVE INSTRUCTIONS - SHOULDER    WOUND CARE and SHOWERING: You may leave the operative dressing in place until your follow-up appointment. KEEP THE INCISIONS CLEAN AND DRY. Keep your dressing clean and dry Recommend just "bird baths" "sponge baths" while your Prevena wound vac is in place Your wound vac must stay dry If you have any issues with the Prevena wound vac please call our office Use the provided ice machine or Ice packs as often as possible for the first 3-4 days, then as needed for pain relief.   Keep a layer of cloth or a shirt between your skin and the cooling unit to prevent frost bite as it can get very cold.  EXERCISES Wear the sling at all times You may remove the sling for bathing, but keep the arm across the chest or in a secondary sling.     REGIONAL ANESTHESIA (NERVE BLOCKS) The anesthesia team may have performed a nerve block for you if safe in the setting of your care.  This is a great tool used to minimize pain.  Typically the block may start wearing off overnight but the long acting medicine may last for 3-4 days.  The nerve block wearing off can be a challenging period but please utilize your as needed pain medications to try and manage this period.    POST-OP MEDICATIONS- Multimodal approach to pain control In general your pain will be controlled with a combination of substances.  Prescriptions unless otherwise discussed are electronically sent to your pharmacy.  This is a carefully made plan we use to minimize narcotic use.     Acetaminophen - Non-narcotic pain medicine taken on a scheduled basis  Oxycodone - This is a strong narcotic, to be used only on an as needed basis for SEVERE pain. Zofran -  take as needed for nausea  FOLLOW-UP If you develop a Fever (>101.5), Redness or Drainage from the  surgical incision site, please call our office to arrange for an evaluation. You are scheduled for a follow-up appointment on Tuesday, February 14th at 1 pm  IF [place], PLEASE FEEL FREE TO CALL OUR OFFICE.  HELPFUL INFORMATION  If you had a block, it will wear off between 8-24 hrs postop typically.  This is period when your pain may go from nearly zero to the pain you would have had post-op without the block.  This is an abrupt transition but nothing dangerous is happening.  You may take an extra dose of narcotic when this happens.  Your arm will be in a sling following surgery. You will be in this sling for the next 6 weeks.  No therapy for 6 weeks  You may be more comfortable sleeping in a semi-seated position the first few nights following surgery.  Keep a pillow propped under the elbow and forearm for comfort.  If you have a recliner type of chair it might be beneficial.  If not that is fine too, but it would be helpful to sleep propped up with pillows behind your operated shoulder as well under your elbow and forearm.  This will reduce pulling on the suture lines.  When dressing, put your operative arm in the sleeve first.  When getting undressed, take your operative arm out last.  Loose fitting, button-down shirts are recommended.  In most states it is against the law  to drive while your arm is in a sling. And certainly against the law to drive while taking narcotics.  You may return to work/school in the next couple of days when you feel up to it. Desk work and typing in the sling is fine.  We suggest you use the pain medication the first night prior to going to bed, in order to ease any pain when the anesthesia wears off. You should avoid taking pain medications on an empty stomach as it will make you nauseous.  Do not drink alcoholic beverages or take illicit drugs when taking pain medications.  Pain medication may make you constipated.  Below are a few solutions to  try in this order: Decrease the amount of pain medication if you arent having pain. Drink lots of decaffeinated fluids. Drink prune juice and/or each dried prunes  If the first 3 dont work start with additional solutions Take Colace - an over-the-counter stool softener Take Senokot - an over-the-counter laxative Take Miralax - a stronger over-the-counter laxative   Dental Antibiotics:  In most cases prophylactic antibiotics for Dental procdeures after total joint surgery are not necessary.  Exceptions are as follows:  1. History of prior total joint infection  2. Severely immunocompromised (Organ Transplant, cancer chemotherapy, Rheumatoid biologic meds such as [place])  3. Poorly controlled diabetes (A1C &gt; 8.0, blood glucose over 200)  If you have one of these conditions, contact your surgeon for an antibiotic prescription, prior to your dental procedure.   For more information including helpful videos and documents visit our website:   [email or web address]           Negative Pressure Wound Therapy Home Guide Negative pressure wound therapy (NPWT) uses a sponge or foam-like material (dressing) placed on or inside the wound. The wound is then covered and sealed with a cover dressing that sticks to your skin (is adhesive) to keep air out. A tube is attached to the cover dressing, and this tube connects to a small pump. The pump removes drainage from the wound. NPWT helps to increase blood flow to the wound and heal it from the inside. NPWT also helps pull the edges of the wound together and removes fluids and germs from the wound. NPWT may also be called wound vac.  What are the risks? NPWT is usually safe to use. However, problems can occur, including: Skin irritation from the dressing adhesive. Bleeding. Infection. Signs of infection include: More redness, swelling, or pain. More fluid or blood. Warmth or hardness around the  wound. Pus or a bad smell. Red streaks leading from the wound. Dehydration. Wounds with large amounts of drainage can cause excessive body fluid loss. Pain.  General tips and recommendations If the alarm sounds: Stay calm and prepare to troubleshoot. Do not turn off the pump or do anything with the dressing. The alarm may go off for many reasons, including: The battery is low. Change the battery or plug the device into electrical power. The dressing has a leak. Find the leak and put tape over the leak. The fluid collection container is full. Change the fluid container. Call your health care provider right away if you cannot fix the problem. Explain to your health care provider what is happening. Follow his or her instructions.  General instructions Do not turn off the pump unless told to do so by your health care provider. Do not turn off the pump for more than 2 hours. If the pump is off or you lose suction to  the dressing for more than 2 hours, the dressing will need to be changed. Check the machine frequently to make sure that therapy is on and that all clamps are open. Do not use over-the-counter medicated or antiseptic creams, sprays, liquids, or dressings unless told by your health care provider. Do not take baths, swim, or use a hot tub until your health care provider approves. You may only be allowed to take sponge baths. Ask your health care provider if you may take showers. If your health care provider says it is okay to shower: Do not take the pump into the shower. Make sure the wound dressing is protected and sealed. The wound dressing must stay dry.  Contact a health care provider if: You have new pain. You develop irritation, a rash, or itching around the wound or dressing. You see new color changes (black, grey, yellow, or white) in the wound. The dressing changes are painful or cause bleeding. The pump has been off for more than 2 hours, and you do not know how to  change the dressing. The pump alarm goes off, and you do not know what to do.  Get help right away if: You have a lot of bleeding. The wound breaks open. You have severe pain. You have signs of infection, such as: More redness, swelling, or pain. More fluid or blood. Warmth or hardness. Pus or a bad smell. Red streaks leading from the wound. A fever. You see a sudden change in the color or texture of the drainage. You have signs of dehydration, such as: Little or no tears, urine, or sweat. Muscle cramps. Very dry mouth. Headache. Dizziness or confusion. Summary NPWT uses a sponge or dressing placed on or inside the wound. NPWT helps to increase blood flow to the wound and heal it from the inside. Follow your health care provider's instructions on how to clean your wound and how to change the dressing. Contact a health care provider if you have new pain, an irritation, or a rash, or if the alarm goes off and you do not know what to do. Get help right away if you have a lot of bleeding, your wound breaks open, or you have severe pain. Also, get help if you have signs of infection.  This information is not intended to replace advice given to you by your health care provider. Make sure you discuss any questions you have with your health care provider. Document Revised: 12/01/2020 Document Reviewed: 12/01/2020 Elsevier Patient Education  [place].

## 2021-08-27 NOTE — Progress Notes (Signed)
° °  ORTHOPAEDIC PROGRESS NOTE  s/p Procedure(s): TOTAL SHOULDER ARTHROPLASTY/HEMI HARDWARE REMOVAL SYNOVECTOMY  SUBJECTIVE: Reports mild pain about operative site. Nerve block still in effect. Pain has been controlled. No chest pain. No SOB. No nausea/vomiting. No other complaints.  OBJECTIVE: PE: General: resting in hospital bed, NAD Cardiac: regular rate Pulmonary: no increased work of breathing LUE: Prevena wound vac in place with good seal, no drainage noted in canister, sling well fitting,  full and painless ROM throughout hand with DPC of 0.  Axillary nerve sensation/motor altered in setting of block and unable to be fully tested.  Distal motor and sensory altered in setting of block.   Vitals:   08/27/21 0031 08/27/21 0451  BP: (!) 133/91 113/74  Pulse: 78 69  Resp: 18 17  Temp: 98 F (36.7 C) 98.4 F (36.9 C)  SpO2: 95% 93%   Stable post-op images.   ASSESSMENT: LATERRANCE NAUTA is a 71 y.o. male doing well postoperatively. POD#1  PLAN: Weightbearing: NWB RUE Insicional and dressing care: Dressings left intact until follow-up Orthopedic device(s): Wound JZP:HXTAVWP, Sling Showering: Hold for now VTE prophylaxis: SCDs Pain control: PRN pain medication, minimze narcotics as able ABLA: Hgb 12.6 today, hemodynamically stable.  Follow - up plan: Tuesday in office Contact information:  Dr. Ophelia Charter, Noemi Chapel PA-C,  After hours and holidays please check [email or web address] for group call information for [facility]  Plan for Infectious Disease consultation today. May require PICC line placement before discharge. If receives PICC line, will need home health for nursing to help with PICC line management.  Will also need CT of left shoulder prior to discharge. Plan for discharge home today with close follow up as long all of the above are completed prior to discharge. Patient and his wife are in agreement with the treatment plan. All of their questions were answered.    Noemi Chapel, PA-C 08/27/2021
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
## 2021-08-27 NOTE — Progress Notes (Signed)
Peripherally Inserted Central Catheter Placement  The IV Nurse has discussed with the patient and/or persons authorized to consent for the patient, the purpose of this procedure and the potential benefits and risks involved with this procedure.  The benefits include less needle sticks, lab draws from the catheter, and the patient may be discharged home with the catheter. Risks include, but not limited to, infection, bleeding, blood clot (thrombus formation), and puncture of an artery; nerve damage and irregular heartbeat and possibility to perform a PICC exchange if needed/ordered by physician.  Alternatives to this procedure were also discussed.  Bard Power PICC patient education guide, fact sheet on infection prevention and patient information card has been provided to patient /or left at bedside.    PICC Placement Documentation  PICC Single Lumen 38/17/71 Right Basilic 39 cm 0 cm (Active)  Indication for Insertion or Continuance of Line Home intravenous therapies (PICC only) 08/27/21 1228  Exposed Catheter (cm) 0 cm 08/27/21 1228  Site Assessment Clean;Dry;Intact 08/27/21 1228  Line Status Flushed;Saline locked;Blood return noted 08/27/21 1228  Dressing Type Transparent;Securing device 08/27/21 1228  Dressing Status Clean;Dry;Intact 08/27/21 1228  Antimicrobial disc in place? Yes 08/27/21 1228  Safety Lock Not Applicable 16/57/90 3833  Line Adjustment (NICU/IV Team Only) No 08/27/21 1228  Dressing Intervention New dressing;Other (Comment) 08/27/21 1228  Dressing Change Due 09/03/21 08/27/21 [place] 08/27/2021, 12:30 PM

## 2021-08-27 NOTE — Plan of Care (Signed)
Problem: Education: Goal: Knowledge of General Education information will improve Description: Including pain rating scale, medication(s)/side effects and non-pharmacologic comfort measures Outcome: Progressing   Problem: Activity: Goal: Risk for activity intolerance will decrease Outcome: Progressing   Problem: Pain Managment: Goal: General experience of comfort will improve Outcome: Progressing   Ivan Anchors, RN 08/27/21 11:31 AM

## 2021-08-27 NOTE — TOC Transition Note (Signed)
Transition of Care [facility]) - CM/SW Discharge Note  Patient Details  Name: Sean Garcia MRN: [id number] Date of Birth: [birth year]  [facility] CM/SW Contact:  Sherie Don, LCSW Phone Number: 08/27/2021, 11:29 AM  Clinical Narrative: Patient will discharge home with IV antibiotics and HHRN. CSW made referral to [facility] with [facility]. Pam set up [facility] with Alvis Lemmings as the agency covers supplies for the patient. CSW spoke with patient's wife, Pharrell Ledford, to update her on IV antibiotics and HHRN being set up pending [place] orders and OPAT being signed. TOC signing off.  Final next level of care: [place] Barriers to Discharge: No Barriers Identified  Patient Goals and CMS Choice Patient states their goals for this hospitalization and ongoing recovery are:: Return home CMS Medicare.gov Compare Post Acute Care list provided to:: Patient Represenative (must comment) Choice offered to / list presented to : Patient, Spouse  Discharge Plan and Services         DME Arranged: N/A DME Agency: NA HH Arranged: RN, IV Antibiotics HH Agency: [employer], [place] Date [facility] Agency Contacted: 08/27/21 Representative spoke with at [place]: Osborne Casco)  Readmission Risk Interventions No flowsheet data found.

## 2021-08-27 NOTE — Plan of Care (Signed)
Patient discharged home with wife via private vehicle. Education provided on post-op care of shoulder, wound vac, and PICC line. Ivan Anchors, RN 08/27/21 4:22 PM

## 2021-08-27 NOTE — Progress Notes (Signed)
PHARMACY CONSULT NOTE FOR:  OUTPATIENT  PARENTERAL ANTIBIOTIC THERAPY (OPAT)  Indication: Left Shoulder PJI Regimen: Ceftriaxone 2g IV Q24H End date: March 23rd, 2023  IV antibiotic discharge orders are pended. To discharging provider:  please sign these orders via discharge navigator,  Select New Orders & click on the button choice - Manage This Unsigned Work.     Thank you for allowing pharmacy to be a part of this patient's care.  Elpidio Anis 08/27/2021, 10:24 AM
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
## 2021-08-28 DIAGNOSIS — M65112 Other infective (teno)synovitis, left shoulder: Secondary | ICD-10-CM | POA: Diagnosis not present

## 2021-08-28 DIAGNOSIS — Z792 Long term (current) use of antibiotics: Secondary | ICD-10-CM | POA: Diagnosis not present

## 2021-08-28 DIAGNOSIS — Z85828 Personal history of other malignant neoplasm of skin: Secondary | ICD-10-CM | POA: Diagnosis not present

## 2021-08-28 DIAGNOSIS — Z9181 History of falling: Secondary | ICD-10-CM | POA: Diagnosis not present

## 2021-08-28 DIAGNOSIS — M1990 Unspecified osteoarthritis, unspecified site: Secondary | ICD-10-CM | POA: Diagnosis not present

## 2021-08-28 DIAGNOSIS — Z6834 Body mass index (BMI) 34.0-34.9, adult: Secondary | ICD-10-CM | POA: Diagnosis not present

## 2021-08-28 DIAGNOSIS — M109 Gout, unspecified: Secondary | ICD-10-CM | POA: Diagnosis not present

## 2021-08-28 DIAGNOSIS — K219 Gastro-esophageal reflux disease without esophagitis: Secondary | ICD-10-CM | POA: Diagnosis not present

## 2021-08-28 DIAGNOSIS — I10 Essential (primary) hypertension: Secondary | ICD-10-CM | POA: Diagnosis not present

## 2021-08-28 DIAGNOSIS — E669 Obesity, unspecified: Secondary | ICD-10-CM | POA: Diagnosis not present

## 2021-08-28 DIAGNOSIS — Z452 Encounter for adjustment and management of vascular access device: Secondary | ICD-10-CM | POA: Diagnosis not present

## 2021-08-28 DIAGNOSIS — E785 Hyperlipidemia, unspecified: Secondary | ICD-10-CM | POA: Diagnosis not present

## 2021-08-28 DIAGNOSIS — T8459XA Infection and inflammatory reaction due to other internal joint prosthesis, initial encounter: Secondary | ICD-10-CM | POA: Diagnosis not present

## 2021-08-28 DIAGNOSIS — Z96612 Presence of left artificial shoulder joint: Secondary | ICD-10-CM | POA: Diagnosis not present

## 2021-08-28 DIAGNOSIS — B9689 Other specified bacterial agents as the cause of diseases classified elsewhere: Secondary | ICD-10-CM | POA: Diagnosis not present

## 2021-08-28 DIAGNOSIS — Z87891 Personal history of nicotine dependence: Secondary | ICD-10-CM | POA: Diagnosis not present

## 2021-08-28 LAB — ACID FAST SMEAR (AFB, MYCOBACTERIA): Acid Fast Smear: NEGATIVE

## 2021-08-28 NOTE — Discharge Summary (Signed)
Patient ID: Sean Garcia MRN: [id number] DOB/AGE: August 30, 1950 71 y.o.  Admit date: 08/26/2021 Discharge date: 08/28/2021  Admission Diagnoses:Left failed reverse total shoulder arthroplasty, infection  Discharge Diagnoses:  Principal Problem:   Status post left shoulder hemiarthroplasty Left shoulder PJI  Past Medical History:  Diagnosis Date   Cancer ([place])    Basal cell   ED (erectile dysfunction)    GERD (gastroesophageal reflux disease)    Gout    Hyperlipidemia    Hypertension    Obesity    Osteoarthritis    Procedures Performed:  - Removal of glenoid implants and humeral head with retention of humeral stem.   - Antibiotic spacer cement and hemiarthroplasty head placed - Bone grafting with allograft of complex glenoid defect - Complex synovectomy with synovial biopsy - PICC line placement  Discharged Condition: good  Hospital Course: Patient brought in to [facility] for scheduled procedure. He tolerated procedure well.  He was kept for monitoring overnight for pain control, ID consultation, PICC line placement, discharge planning, and medical monitoring postop. He was found to be stable for DC home the morning after surgery.  Patient was instructed on specific activity restrictions and all questions were answered.  Consults: Infectious Disease, OT  Significant Diagnostic Studies: Cultures, CT of left shoulder  Treatments: Surgery, PICC line placement  Discharge Exam: General: resting in hospital bed, NAD Cardiac: regular rate Pulmonary: no increased work of breathing LUE: Prevena wound vac in place with good seal, no drainage noted in canister, sling well fitting,  full and painless ROM throughout hand with DPC of 0.  Axillary nerve sensation/motor altered in setting of block and unable to be fully tested.  Distal motor and sensory altered in setting of block.  Disposition: Discharge disposition: 01-Home or Self Care       Discharge Instructions      [facility] pharmacist to adjust dose for Vancomycin, Aminoglycosides and other anti-infective therapies as requested by physician.   Complete by: As directed    [facility] to provide Cath Flo 87m   Complete by: As directed    Administer for PICC line occlusion and as ordered by physician for other access device issues.   Anaphylaxis Kit: Provided to treat any anaphylactic reaction to the medication being provided to the patient if First Dose or when requested by physician   Complete by: As directed    Epinephrine 11mml vial / amp: Administer 0.64m24m0.64ml67mubcutaneously once for moderate to severe anaphylaxis, nurse to call physician and pharmacy when reaction occurs and call 911 if needed for immediate care   Diphenhydramine 50mg31mIV vial: Administer 25-50mg 79mM PRN for first dose reaction, rash, itching, mild reaction, nurse to call physician and pharmacy when reaction occurs   Sodium Chloride 0.9% NS 500ml I48mdminister if needed for hypovolemic blood pressure drop or as ordered by physician after call to physician with anaphylactic reaction   Call MD for:  redness, tenderness, or signs of infection (pain, swelling, redness, odor or green/yellow discharge around incision site)   Complete by: As directed    Call MD for:  severe uncontrolled pain   Complete by: As directed    Call MD for:  temperature >100.4   Complete by: As directed    Change dressing on IV access line weekly and PRN   Complete by: As directed    Diet - low sodium heart healthy   Complete by: As directed    Flush IV access with  Sodium Chloride 0.9% and Heparin 10 units/ml or 100 units/ml   Complete by: As directed    Home infusion instructions - [facility]   Complete by: As directed    Instructions: Flush IV access with Sodium Chloride 0.9% and Heparin 10units/ml or 100units/ml   Change dressing on IV access line: Weekly and PRN   Instructions Cath Flo 73m: Administer for  PICC Line occlusion and as ordered by physician for other access device   [facility] pharmacist to adjust dose for: Vancomycin, Aminoglycosides and other anti-infective therapies as requested by physician   Method of administration may be changed at the discretion of home infusion pharmacist based upon assessment of the patient and/or caregivers ability to self-administer the medication ordered   Complete by: As directed       Allergies as of 08/27/2021       Reactions   Meloxicam    Stomach ulcer was developed   Codeine Itching, Rash        Medication List     STOP taking these medications    doxycycline 100 MG capsule Commonly known as: VIBRAMYCIN       TAKE these medications    acetaminophen 500 MG tablet Commonly known as: TYLENOL Take 2 tablets (1,000 mg total) by mouth every 8 (eight) hours for 14 days.   amLODipine 5 MG tablet Commonly known as: NORVASC TAKE 1 TABLET BY MOUTH EVERY DAY   atorvastatin 20 MG tablet Commonly known as: LIPITOR TAKE 1 TABLET BY MOUTH EVERY DAY What changed:  how much to take how to take this when to take this additional instructions   bisoprolol 10 MG tablet Commonly known as: ZEBETA TAKE 1 TABLET BY MOUTH EVERY DAY   cefTRIAXone  IVPB Commonly known as: ROCEPHIN Inject 2 g into the vein daily. Indication:  L Shoulder PJI First Dose: Yes Last Day of Therapy:  10/08/21 Labs - Once weekly:  CBC/D and BMP, Labs - Every other week:  ESR and CRP Method of administration: IV Push Method of administration may be changed at the discretion of home infusion pharmacist based upon assessment of the patient and/or caregiver's ability to self-administer the medication ordered.   colchicine 0.6 MG tablet TAKE 1 TABLET (0.6 MG TOTAL) BY MOUTH 2 (TWO) TIMES DAILY AS NEEDED.   desoximetasone 0.25 % cream Commonly known as: TOPICORT Apply topically 2 (two) times daily as needed.   lisinopril 40 MG tablet Commonly known  as: ZESTRIL TAKE 1 TABLET BY MOUTH EVERY DAY   ondansetron 4 MG tablet Commonly known as: Zofran Take 1 tablet (4 mg total) by mouth every 8 (eight) hours as needed for up to 7 days for nausea or vomiting.   oxyCODONE 5 MG immediate release tablet Commonly known as: Oxy IR/ROXICODONE Take 1-2 pills every 6 hrs as needed for severe pain, no more than 6 per day What changed:  how much to take how to take this when to take this reasons to take this additional instructions   vardenafil 20 MG tablet Commonly known as: LEVITRA Take 1 tablet (20 mg total) by mouth daily as needed.               Discharge Care Instructions  (From admission, onward)           Start     Ordered   08/27/21 0000  Change dressing on IV access line weekly and PRN  (Home infusion instructions - [facility] )  08/27/21 1357            Follow-up Information     [facility] Follow up.   Specialty: Home Health Services Why: Alvis Lemmings will provide nursing for PICC line maintenance and labs. Contact information: [place] [address] [phone]         [facility] Follow up.   Why: [facility] will provide home IV antibiotics.        Hiram Gash, MD Follow up on 09/01/2021.   Specialty: Orthopedic Surgery Why: @ 1pm to recheck your left shoulder Contact information: [address] [place] [address] [phone]                 Judene Logue, PA-C 08/28/2021

## 2021-09-01 DIAGNOSIS — M65112 Other infective (teno)synovitis, left shoulder: Secondary | ICD-10-CM | POA: Diagnosis not present

## 2021-09-01 DIAGNOSIS — B9689 Other specified bacterial agents as the cause of diseases classified elsewhere: Secondary | ICD-10-CM | POA: Diagnosis not present

## 2021-09-01 DIAGNOSIS — Z4731 Aftercare following explantation of shoulder joint prosthesis: Secondary | ICD-10-CM | POA: Diagnosis not present

## 2021-09-01 DIAGNOSIS — T8459XD Infection and inflammatory reaction due to other internal joint prosthesis, subsequent encounter: Secondary | ICD-10-CM | POA: Diagnosis not present

## 2021-09-01 DIAGNOSIS — E785 Hyperlipidemia, unspecified: Secondary | ICD-10-CM | POA: Diagnosis not present

## 2021-09-01 DIAGNOSIS — T8459XA Infection and inflammatory reaction due to other internal joint prosthesis, initial encounter: Secondary | ICD-10-CM | POA: Diagnosis not present

## 2021-09-01 DIAGNOSIS — M1990 Unspecified osteoarthritis, unspecified site: Secondary | ICD-10-CM | POA: Diagnosis not present

## 2021-09-01 DIAGNOSIS — I10 Essential (primary) hypertension: Secondary | ICD-10-CM | POA: Diagnosis not present

## 2021-09-02 ENCOUNTER — Encounter (HOSPITAL_COMMUNITY): Payer: Self-pay | Admitting: Orthopaedic Surgery

## 2021-09-04 DIAGNOSIS — M25512 Pain in left shoulder: Secondary | ICD-10-CM | POA: Diagnosis not present

## 2021-09-04 DIAGNOSIS — T8459XD Infection and inflammatory reaction due to other internal joint prosthesis, subsequent encounter: Secondary | ICD-10-CM | POA: Diagnosis not present

## 2021-09-04 LAB — AEROBIC/ANAEROBIC CULTURE W GRAM STAIN (SURGICAL/DEEP WOUND)
Culture: NO GROWTH
Culture: NO GROWTH
Culture: NO GROWTH
Gram Stain: NONE SEEN

## 2021-09-07 DIAGNOSIS — B9689 Other specified bacterial agents as the cause of diseases classified elsewhere: Secondary | ICD-10-CM | POA: Diagnosis not present

## 2021-09-07 DIAGNOSIS — E785 Hyperlipidemia, unspecified: Secondary | ICD-10-CM | POA: Diagnosis not present

## 2021-09-07 DIAGNOSIS — T8459XA Infection and inflammatory reaction due to other internal joint prosthesis, initial encounter: Secondary | ICD-10-CM | POA: Diagnosis not present

## 2021-09-07 DIAGNOSIS — M1990 Unspecified osteoarthritis, unspecified site: Secondary | ICD-10-CM | POA: Diagnosis not present

## 2021-09-07 DIAGNOSIS — Z4731 Aftercare following explantation of shoulder joint prosthesis: Secondary | ICD-10-CM | POA: Diagnosis not present

## 2021-09-07 DIAGNOSIS — I10 Essential (primary) hypertension: Secondary | ICD-10-CM | POA: Diagnosis not present

## 2021-09-07 DIAGNOSIS — M65112 Other infective (teno)synovitis, left shoulder: Secondary | ICD-10-CM | POA: Diagnosis not present

## 2021-09-08 DIAGNOSIS — M25512 Pain in left shoulder: Secondary | ICD-10-CM | POA: Diagnosis not present

## 2021-09-10 DIAGNOSIS — T8459XD Infection and inflammatory reaction due to other internal joint prosthesis, subsequent encounter: Secondary | ICD-10-CM | POA: Diagnosis not present

## 2021-09-10 LAB — FUNGUS CULTURE WITH STAIN

## 2021-09-10 LAB — FUNGAL ORGANISM REFLEX

## 2021-09-10 LAB — FUNGUS CULTURE RESULT

## 2021-09-14 DIAGNOSIS — M65112 Other infective (teno)synovitis, left shoulder: Secondary | ICD-10-CM | POA: Diagnosis not present

## 2021-09-14 DIAGNOSIS — B9689 Other specified bacterial agents as the cause of diseases classified elsewhere: Secondary | ICD-10-CM | POA: Diagnosis not present

## 2021-09-14 DIAGNOSIS — M1990 Unspecified osteoarthritis, unspecified site: Secondary | ICD-10-CM | POA: Diagnosis not present

## 2021-09-14 DIAGNOSIS — T8459XA Infection and inflammatory reaction due to other internal joint prosthesis, initial encounter: Secondary | ICD-10-CM | POA: Diagnosis not present

## 2021-09-14 DIAGNOSIS — I10 Essential (primary) hypertension: Secondary | ICD-10-CM | POA: Diagnosis not present

## 2021-09-14 DIAGNOSIS — E785 Hyperlipidemia, unspecified: Secondary | ICD-10-CM | POA: Diagnosis not present

## 2021-09-16 ENCOUNTER — Encounter: Payer: Medicare Other | Admitting: Internal Medicine

## 2021-09-16 LAB — AEROBIC/ANAEROBIC CULTURE W GRAM STAIN (SURGICAL/DEEP WOUND)
Culture: NO GROWTH
Culture: NO GROWTH
Culture: NO GROWTH
Gram Stain: NONE SEEN
Gram Stain: NONE SEEN
Gram Stain: NONE SEEN

## 2021-09-17 DIAGNOSIS — T8459XD Infection and inflammatory reaction due to other internal joint prosthesis, subsequent encounter: Secondary | ICD-10-CM | POA: Diagnosis not present

## 2021-09-21 ENCOUNTER — Other Ambulatory Visit: Payer: Self-pay | Admitting: Internal Medicine

## 2021-09-21 DIAGNOSIS — M1990 Unspecified osteoarthritis, unspecified site: Secondary | ICD-10-CM | POA: Diagnosis not present

## 2021-09-21 DIAGNOSIS — B9689 Other specified bacterial agents as the cause of diseases classified elsewhere: Secondary | ICD-10-CM | POA: Diagnosis not present

## 2021-09-21 DIAGNOSIS — M65112 Other infective (teno)synovitis, left shoulder: Secondary | ICD-10-CM | POA: Diagnosis not present

## 2021-09-21 DIAGNOSIS — E785 Hyperlipidemia, unspecified: Secondary | ICD-10-CM | POA: Diagnosis not present

## 2021-09-21 DIAGNOSIS — I10 Essential (primary) hypertension: Secondary | ICD-10-CM | POA: Diagnosis not present

## 2021-09-21 DIAGNOSIS — T8459XA Infection and inflammatory reaction due to other internal joint prosthesis, initial encounter: Secondary | ICD-10-CM | POA: Diagnosis not present

## 2021-09-22 ENCOUNTER — Ambulatory Visit (INDEPENDENT_AMBULATORY_CARE_PROVIDER_SITE_OTHER): Payer: Medicare Other | Admitting: Internal Medicine

## 2021-09-22 ENCOUNTER — Other Ambulatory Visit: Payer: Self-pay

## 2021-09-22 ENCOUNTER — Encounter: Payer: Self-pay | Admitting: Internal Medicine

## 2021-09-22 VITALS — BP 135/75 | HR 67 | Temp 99.0°F | Ht 73.0 in | Wt 265.0 lb

## 2021-09-22 DIAGNOSIS — T8450XD Infection and inflammatory reaction due to unspecified internal joint prosthesis, subsequent encounter: Secondary | ICD-10-CM

## 2021-09-22 DIAGNOSIS — T8450XS Infection and inflammatory reaction due to unspecified internal joint prosthesis, sequela: Secondary | ICD-10-CM

## 2021-09-22 MED ORDER — CEFADROXIL 500 MG PO CAPS: 500.0000 mg | ORAL_CAPSULE | Freq: Two times a day (BID) | ORAL | 5 refills | Status: DC
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
## 2021-09-25 LAB — FUNGUS CULTURE WITH STAIN

## 2021-09-25 LAB — FUNGUS CULTURE RESULT

## 2021-09-25 LAB — FUNGAL ORGANISM REFLEX

## 2021-09-27 DIAGNOSIS — B9689 Other specified bacterial agents as the cause of diseases classified elsewhere: Secondary | ICD-10-CM | POA: Diagnosis not present

## 2021-09-27 DIAGNOSIS — Z452 Encounter for adjustment and management of vascular access device: Secondary | ICD-10-CM | POA: Diagnosis not present

## 2021-09-27 DIAGNOSIS — E669 Obesity, unspecified: Secondary | ICD-10-CM | POA: Diagnosis not present

## 2021-09-27 DIAGNOSIS — Z6834 Body mass index (BMI) 34.0-34.9, adult: Secondary | ICD-10-CM | POA: Diagnosis not present

## 2021-09-27 DIAGNOSIS — T8459XA Infection and inflammatory reaction due to other internal joint prosthesis, initial encounter: Secondary | ICD-10-CM | POA: Diagnosis not present

## 2021-09-27 DIAGNOSIS — Z9181 History of falling: Secondary | ICD-10-CM | POA: Diagnosis not present

## 2021-09-27 DIAGNOSIS — Z792 Long term (current) use of antibiotics: Secondary | ICD-10-CM | POA: Diagnosis not present

## 2021-09-27 DIAGNOSIS — M1990 Unspecified osteoarthritis, unspecified site: Secondary | ICD-10-CM | POA: Diagnosis not present

## 2021-09-27 DIAGNOSIS — Z87891 Personal history of nicotine dependence: Secondary | ICD-10-CM | POA: Diagnosis not present

## 2021-09-27 DIAGNOSIS — Z96612 Presence of left artificial shoulder joint: Secondary | ICD-10-CM | POA: Diagnosis not present

## 2021-09-27 DIAGNOSIS — Z85828 Personal history of other malignant neoplasm of skin: Secondary | ICD-10-CM | POA: Diagnosis not present

## 2021-09-27 DIAGNOSIS — E785 Hyperlipidemia, unspecified: Secondary | ICD-10-CM | POA: Diagnosis not present

## 2021-09-27 DIAGNOSIS — K219 Gastro-esophageal reflux disease without esophagitis: Secondary | ICD-10-CM | POA: Diagnosis not present

## 2021-09-27 DIAGNOSIS — I10 Essential (primary) hypertension: Secondary | ICD-10-CM | POA: Diagnosis not present

## 2021-09-27 DIAGNOSIS — M109 Gout, unspecified: Secondary | ICD-10-CM | POA: Diagnosis not present

## 2021-09-27 DIAGNOSIS — M65112 Other infective (teno)synovitis, left shoulder: Secondary | ICD-10-CM | POA: Diagnosis not present

## 2021-09-29 DIAGNOSIS — M1990 Unspecified osteoarthritis, unspecified site: Secondary | ICD-10-CM | POA: Diagnosis not present

## 2021-09-29 DIAGNOSIS — M65112 Other infective (teno)synovitis, left shoulder: Secondary | ICD-10-CM | POA: Diagnosis not present

## 2021-09-29 DIAGNOSIS — Z4731 Aftercare following explantation of shoulder joint prosthesis: Secondary | ICD-10-CM | POA: Diagnosis not present

## 2021-09-29 DIAGNOSIS — I10 Essential (primary) hypertension: Secondary | ICD-10-CM | POA: Diagnosis not present

## 2021-09-29 DIAGNOSIS — E785 Hyperlipidemia, unspecified: Secondary | ICD-10-CM | POA: Diagnosis not present

## 2021-09-29 DIAGNOSIS — B9689 Other specified bacterial agents as the cause of diseases classified elsewhere: Secondary | ICD-10-CM | POA: Diagnosis not present

## 2021-09-29 DIAGNOSIS — T8459XA Infection and inflammatory reaction due to other internal joint prosthesis, initial encounter: Secondary | ICD-10-CM | POA: Diagnosis not present

## 2021-10-06 DIAGNOSIS — T8459XA Infection and inflammatory reaction due to other internal joint prosthesis, initial encounter: Secondary | ICD-10-CM | POA: Diagnosis not present

## 2021-10-06 DIAGNOSIS — M1990 Unspecified osteoarthritis, unspecified site: Secondary | ICD-10-CM | POA: Diagnosis not present

## 2021-10-06 DIAGNOSIS — E785 Hyperlipidemia, unspecified: Secondary | ICD-10-CM | POA: Diagnosis not present

## 2021-10-06 DIAGNOSIS — I10 Essential (primary) hypertension: Secondary | ICD-10-CM | POA: Diagnosis not present

## 2021-10-06 DIAGNOSIS — M65112 Other infective (teno)synovitis, left shoulder: Secondary | ICD-10-CM | POA: Diagnosis not present

## 2021-10-06 DIAGNOSIS — B9689 Other specified bacterial agents as the cause of diseases classified elsewhere: Secondary | ICD-10-CM | POA: Diagnosis not present

## 2021-10-08 DIAGNOSIS — T8459XD Infection and inflammatory reaction due to other internal joint prosthesis, subsequent encounter: Secondary | ICD-10-CM | POA: Diagnosis not present

## 2021-10-14 ENCOUNTER — Other Ambulatory Visit: Payer: Self-pay | Admitting: Orthopaedic Surgery

## 2021-10-14 DIAGNOSIS — M6281 Muscle weakness (generalized): Secondary | ICD-10-CM | POA: Diagnosis not present

## 2021-10-14 DIAGNOSIS — T8450XD Infection and inflammatory reaction due to unspecified internal joint prosthesis, subsequent encounter: Secondary | ICD-10-CM | POA: Diagnosis not present

## 2021-10-14 DIAGNOSIS — Z9889 Other specified postprocedural states: Secondary | ICD-10-CM

## 2021-10-14 DIAGNOSIS — Z96612 Presence of left artificial shoulder joint: Secondary | ICD-10-CM | POA: Diagnosis not present

## 2021-10-14 DIAGNOSIS — M25612 Stiffness of left shoulder, not elsewhere classified: Secondary | ICD-10-CM | POA: Diagnosis not present

## 2021-10-14 LAB — ACID FAST CULTURE WITH REFLEXED SENSITIVITIES (MYCOBACTERIA): Acid Fast Culture: NEGATIVE

## 2021-10-23 ENCOUNTER — Other Ambulatory Visit: Payer: Medicare Other

## 2021-10-24 ENCOUNTER — Other Ambulatory Visit: Payer: Self-pay | Admitting: Internal Medicine

## 2021-11-06 ENCOUNTER — Other Ambulatory Visit: Payer: Medicare Other

## 2021-11-06 ENCOUNTER — Ambulatory Visit
Admission: RE | Admit: 2021-11-06 | Discharge: 2021-11-06 | Disposition: A | Discharge status: Home / Self Care | Payer: Medicare Other | Source: Ambulatory Visit | Attending: Orthopaedic Surgery | Admitting: Orthopaedic Surgery

## 2021-11-06 DIAGNOSIS — M19012 Primary osteoarthritis, left shoulder: Secondary | ICD-10-CM | POA: Diagnosis not present

## 2021-11-06 DIAGNOSIS — Z471 Aftercare following joint replacement surgery: Secondary | ICD-10-CM | POA: Diagnosis not present

## 2021-11-06 DIAGNOSIS — I700 Atherosclerosis of aorta: Secondary | ICD-10-CM | POA: Diagnosis not present

## 2021-11-06 DIAGNOSIS — Z9889 Other specified postprocedural states: Secondary | ICD-10-CM

## 2021-11-06 DIAGNOSIS — Z96642 Presence of left artificial hip joint: Secondary | ICD-10-CM | POA: Diagnosis not present

## 2021-11-15 ENCOUNTER — Other Ambulatory Visit: Payer: Self-pay | Admitting: Internal Medicine

## 2021-11-19 DIAGNOSIS — T8450XD Infection and inflammatory reaction due to unspecified internal joint prosthesis, subsequent encounter: Secondary | ICD-10-CM | POA: Diagnosis not present

## 2021-11-23 ENCOUNTER — Other Ambulatory Visit: Payer: Self-pay

## 2021-11-23 ENCOUNTER — Ambulatory Visit (INDEPENDENT_AMBULATORY_CARE_PROVIDER_SITE_OTHER): Payer: Medicare Other | Admitting: Internal Medicine

## 2021-11-23 ENCOUNTER — Encounter: Payer: Self-pay | Admitting: Internal Medicine

## 2021-11-23 VITALS — BP 155/102 | HR 95 | Temp 97.8°F | Wt 266.0 lb

## 2021-11-23 DIAGNOSIS — L089 Local infection of the skin and subcutaneous tissue, unspecified: Secondary | ICD-10-CM | POA: Diagnosis not present
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
## 2021-11-24 LAB — CBC WITH DIFFERENTIAL/PLATELET
Absolute Monocytes: 824 cells/uL (ref 200–950)
Basophils Absolute: 40 cells/uL (ref 0–200)
Basophils Relative: 0.6 %
Eosinophils Absolute: 288 cells/uL (ref 15–500)
Eosinophils Relative: 4.3 %
HCT: 43.8 % (ref 38.5–50.0)
Hemoglobin: 14.2 g/dL (ref 13.2–17.1)
Lymphs Abs: 1789 cells/uL (ref 850–3900)
MCH: 28.5 pg (ref 27.0–33.0)
MCHC: 32.4 g/dL (ref 32.0–36.0)
MCV: 87.8 fL (ref 80.0–100.0)
MPV: 11.4 fL (ref 7.5–12.5)
Monocytes Relative: 12.3 %
Neutro Abs: 3759 cells/uL (ref 1500–7800)
Neutrophils Relative %: 56.1 %
Platelets: 210 10*3/uL (ref 140–400)
RBC: 4.99 10*6/uL (ref 4.20–5.80)
RDW: 12.2 % (ref 11.0–15.0)
Total Lymphocyte: 26.7 %
WBC: 6.7 10*3/uL (ref 3.8–10.8)

## 2021-11-24 LAB — COMPLETE METABOLIC PANEL WITH GFR
AG Ratio: 1.5 (calc) (ref 1.0–2.5)
ALT: 16 U/L (ref 9–46)
AST: 22 U/L (ref 10–35)
Albumin: 4 g/dL (ref 3.6–5.1)
Alkaline phosphatase (APISO): 116 U/L (ref 35–144)
BUN: 14 mg/dL (ref 7–25)
CO2: 27 mmol/L (ref 20–32)
Calcium: 10.6 mg/dL — ABNORMAL HIGH (ref 8.6–10.3)
Chloride: 106 mmol/L (ref 98–110)
Creat: 0.87 mg/dL (ref 0.70–1.35)
Globulin: 2.6 g/dL (calc) (ref 1.9–3.7)
Glucose, Bld: 101 mg/dL — ABNORMAL HIGH (ref 65–99)
Potassium: 4.5 mmol/L (ref 3.5–5.3)
Sodium: 140 mmol/L (ref 135–146)
Total Bilirubin: 0.3 mg/dL (ref 0.2–1.2)
Total Protein: 6.6 g/dL (ref 6.1–8.1)
eGFR: 94 mL/min/{1.73_m2} (ref >=60)

## 2021-11-24 LAB — SEDIMENTATION RATE: Sed Rate: 9 mm/h (ref 0–20)

## 2021-11-24 LAB — C-REACTIVE PROTEIN: CRP: 2.6 mg/L (ref <8.0)

## 2021-11-25 ENCOUNTER — Encounter: Payer: Self-pay | Admitting: Internal Medicine

## 2021-11-25 ENCOUNTER — Ambulatory Visit (INDEPENDENT_AMBULATORY_CARE_PROVIDER_SITE_OTHER): Payer: Medicare Other | Admitting: Internal Medicine

## 2021-11-25 ENCOUNTER — Other Ambulatory Visit: Payer: Self-pay | Admitting: Internal Medicine

## 2021-11-25 VITALS — BP 134/86 | HR 66 | Temp 97.7°F | Ht 72.0 in | Wt 266.0 lb

## 2021-11-25 DIAGNOSIS — I10 Essential (primary) hypertension: Secondary | ICD-10-CM | POA: Diagnosis not present

## 2021-11-25 DIAGNOSIS — I2510 Atherosclerotic heart disease of native coronary artery without angina pectoris: Secondary | ICD-10-CM

## 2021-11-25 DIAGNOSIS — Z0000 Encounter for general adult medical examination without abnormal findings: Secondary | ICD-10-CM

## 2021-11-25 DIAGNOSIS — M1000 Idiopathic gout, unspecified site: Secondary | ICD-10-CM | POA: Diagnosis not present

## 2021-11-25 DIAGNOSIS — M159 Polyosteoarthritis, unspecified: Secondary | ICD-10-CM | POA: Diagnosis not present

## 2021-11-25 MED ORDER — DESOXIMETASONE 0.25 % EX CREA: TOPICAL_CREAM | Freq: Two times a day (BID) | CUTANEOUS | 1 refills | Status: DC | PRN

## 2021-11-25 NOTE — Progress Notes (Signed)
? ?Subjective:  ? ? Patient ID: Sean Garcia, male    DOB: 1950/08/16, 71 y.o.   MRN: [id number] ? ?HPI ?Here for Medicare wellness visit and follow up of chronic health conditions ?Reviewed form and advanced directives ?Reviewed other doctors ?Only surgery was for shoulder in February ?No hospitalizations ?No alcohol or tobacco ?Not really exercising--discussed ?Did fall last October--missed step. No major injury (skinned knees) ?No depression or anhedonia ?Independent with instrumental ADLs ?No sig memory issues ? ?Just had follow up with surgeon--after the left shoulder repair for infection ?Has spacers in still ?Trouble raising it up but has been cleared to play golf again ?Still on suppressive antibiotic ? ?No flares of gout ?Continues on daily colchicine prophylaxis ? ?Ongoing hand and other arthritic pain ?Uses tylenol prn only--'1000mg'$   ?Rare aleve--like before golf ? ?No chest pain or SOB ?No dizziness or syncope ?Slight evening edema if on his feet all day ?No palpitations ?No headache ? ?BMI still 36 ?Appetite is fine ? ?Current Outpatient Medications on File Prior to Visit  ?Medication Sig Dispense Refill  ? ACETAMINOPHEN EXTRA STRENGTH 500 MG tablet Take 1,000 mg by mouth every 8 (eight) hours.    ? amLODipine (NORVASC) 5 MG tablet TAKE 1 TABLET BY MOUTH EVERY DAY 90 tablet 1  ? atorvastatin (LIPITOR) 20 MG tablet TAKE 1 TABLET BY MOUTH EVERY DAY 90 tablet 0  ? bisoprolol (ZEBETA) 10 MG tablet TAKE 1 TABLET BY MOUTH EVERY DAY 90 tablet 3  ? cefadroxil (DURICEF) 500 MG capsule Take 500 mg by mouth 2 (two) times daily.    ? colchicine 0.6 MG tablet TAKE 1 TABLET (0.6 MG TOTAL) BY MOUTH 2 (TWO) TIMES DAILY AS NEEDED. 180 tablet 0  ? desoximetasone (TOPICORT) 0.25 % cream Apply topically 2 (two) times daily as needed. 60 g 1  ? lisinopril (ZESTRIL) 40 MG tablet TAKE 1 TABLET BY MOUTH EVERY DAY 90 tablet 3  ? vardenafil (LEVITRA) 20 MG tablet Take 1 tablet (20 mg total) by mouth daily as needed. 10 tablet 11   ? ?No current facility-administered medications on file prior to visit.  ? ? ?Allergies  ?Allergen Reactions  ? Meloxicam   ?  Stomach ulcer was developed  ? Codeine Itching and Rash  ? ? ?Past Medical History:  ?Diagnosis Date  ? Cancer [facility])   ? Basal cell  ? ED (erectile dysfunction)   ? GERD (gastroesophageal reflux disease)   ? Gout   ? Hyperlipidemia   ? Hypertension   ? Obesity   ? Osteoarthritis   ? ? ?Past Surgical History:  ?Procedure Laterality Date  ? COLONOSCOPY  2009  ? benign polyp, rpt 10 yrs [facility])  ? HARDWARE REMOVAL Left 08/26/2021  ? Procedure: HARDWARE REMOVAL;  Surgeon: Hiram Gash, MD;  Location: [facility];  Service: Orthopedics;  Laterality: Left;  ? KNEE SURGERY    ? for bowlegs, age 43  ? PATELLAR TENDON REPAIR  09/2008  ? left, Dr. Leilani Merl in [place]  ? SHOULDER ARTHROSCOPY Left 08/13/2021  ? Procedure: ARTHROSCOPY SHOULDER;  Surgeon: Hiram Gash, MD;  Location: [place];  Service: Orthopedics;  Laterality: Left;  ? SYNOVECTOMY Left 08/26/2021  ? Procedure: SYNOVECTOMY;  Surgeon: Hiram Gash, MD;  Location: [facility];  Service: Orthopedics;  Laterality: Left;  ? SYNOVIAL BIOPSY Left 08/13/2021  ? Procedure: SYNOVIAL BIOPSY;  Surgeon: Hiram Gash, MD;  Location: [place];  Service: Orthopedics;  Laterality: Left;  ?  TOTAL KNEE ARTHROPLASTY  2007  ? bilateral  ? TOTAL SHOULDER ARTHROPLASTY Left 08/26/2021  ? Procedure: TOTAL SHOULDER ARTHROPLASTY/HEMI;  Surgeon: Hiram Gash, MD;  Location: [facility];  Service: Orthopedics;  Laterality: Left;  ? TOTAL SHOULDER REPLACEMENT  05/2009  ? left   ? ? ?Family History  ?Problem Relation Age of Onset  ? Hyperlipidemia Mother   ? Cancer Mother   ?     lymphoma  ? Coronary artery disease Father   ? Heart disease Father   ?     CHF  ? Colon cancer Neg Hx   ? Colon polyps Neg Hx   ? Esophageal cancer Neg Hx   ? Stomach cancer Neg Hx   ? Rectal cancer Neg Hx   ? ? ?Social History  ? ?Socioeconomic History  ? Marital status:  Married  ?  Spouse name: Not on file  ? Number of children: 2  ? Years of education: Not on file  ? Highest education level: Not on file  ?Occupational History  ? Occupation: Landscape architect of [employer]  ?  Employer: [employer]  ?  Comment: Retired  ?Tobacco Use  ? Smoking status: Former  ?  Passive exposure: Never  ? Smokeless tobacco: Never  ? Tobacco comments:  ?  Quit in his 12's.  ?Vaping Use  ? Vaping Use: Never used  ?Substance and Sexual Activity  ? Alcohol use: No  ? Drug use: Not Currently  ? Sexual activity: Yes  ?Other Topics Concern  ? Not on file  ?Social History Narrative  ? Has living will  ? Wife, then daughter Lenna Sciara, would be health care POA  ? Would accept resuscitation  ? No long term life support (like ventilator or tube feeds) if cognitively unaware  ? ?Social Determinants of Health  ? ?Financial Resource Strain: Not on file  ?Food Insecurity: Not on file  ?Transportation Needs: Not on file  ?Physical Activity: Not on file  ?Stress: Not on file  ?Social Connections: Not on file  ?Intimate Partner Violence: Not on file  ? ?Review of Systems ?Sleeps well ?Wears seat belt ?Teeth are fine ?Has persistent skin spot at right 2nd MCP----advised it is suspicious (needs to see derm) ?No heartburn or dysphagia ?Bowels move fine--no blood ?Voids well--good stream and no trouble emptying ?   ?Objective:  ? Physical Exam ?Constitutional:   ?   Appearance: Normal appearance.  ?HENT:  ?   Mouth/Throat:  ?   Comments: No lesions ?Eyes:  ?   Conjunctiva/sclera: Conjunctivae normal.  ?   Pupils: Pupils are equal, round, and reactive to light.  ?Cardiovascular:  ?   Rate and Rhythm: Normal rate and regular rhythm.  ?   Pulses: Normal pulses.  ?   Heart sounds: No murmur heard. ?  No gallop.  ?Pulmonary:  ?   Effort: Pulmonary effort is normal.  ?   Breath sounds: Normal breath sounds. No wheezing or rales.  ?Abdominal:  ?   Palpations: Abdomen is soft.  ?   Tenderness: There is no  abdominal tenderness.  ?Musculoskeletal:  ?   Cervical back: Neck supple.  ?   Right lower leg: No edema.  ?Lymphadenopathy:  ?   Cervical: No cervical adenopathy.  ?Skin: ?   Findings: No rash.  ?   Comments: Suspicious lesion on right hand (2nd MCP)  ?Neurological:  ?   General: No focal deficit present.  ?   Mental Status:  He is alert and oriented to person, place, and time.  ?   Comments: Mini-cog normal  ?Psychiatric:     ?   Mood and Affect: Mood normal.     ?   Behavior: Behavior normal.  ?  ? ? ? ? ?   ?Assessment & Plan:  ? ?

## 2021-11-25 NOTE — Telephone Encounter (Signed)
Topicort is not covered by insurance. Here are the covered medications: ?All Pharmacy Suggested Alternatives:   ?mometasone (ELOCON) 0.1 % ointment ?triamcinolone ointment (KENALOG) 0.5 % ?betamethasone dipropionate 0.05 % cream ?augmented betamethasone dipropionate (DIPROLENE-AF) 0.05 % cream ?betamethasone dipropionate 0.05 % lotion ?betamethasone, augmented, (DIPROLENE) 0.05 % lotion  ? ?

## 2021-11-25 NOTE — Patient Instructions (Addendum)
Please get your tetanus booster at the pharmacy ? ?DASH Eating Plan ?DASH stands for Dietary Approaches to Stop Hypertension. The DASH eating plan is a healthy eating plan that has been shown to: ?Reduce high blood pressure (hypertension). ?Reduce your risk for type 2 diabetes, heart disease, and stroke. ?Help with weight loss. ?What are tips for following this plan? ?Reading food labels ?Check food labels for the amount of salt (sodium) per serving. Choose foods with less than 5 percent of the Daily Value of sodium. Generally, foods with less than 300 milligrams (mg) of sodium per serving fit into this eating plan. ?To find whole grains, look for the word "whole" as the first word in the ingredient list. ?Shopping ?Buy products labeled as "low-sodium" or "no salt added." ?Buy fresh foods. Avoid canned foods and pre-made or frozen meals. ?Cooking ?Avoid adding salt when cooking. Use salt-free seasonings or herbs instead of table salt or sea salt. Check with your health care provider or pharmacist before using salt substitutes. ?Do not fry foods. Cook foods using healthy methods such as baking, boiling, grilling, roasting, and broiling instead. ?Cook with heart-healthy oils, such as olive, canola, avocado, soybean, or sunflower oil. ?Meal planning ? ?Eat a balanced diet that includes: ?4 or more servings of fruits and 4 or more servings of vegetables each day. Try to fill one-half of your plate with fruits and vegetables. ?6-8 servings of whole grains each day. ?Less than 6 oz (170 g) of lean meat, poultry, or fish each day. A 3-oz (85-g) serving of meat is about the same size as a deck of cards. One egg equals 1 oz (28 g). ?2-3 servings of low-fat dairy each day. One serving is 1 cup (237 mL). ?1 serving of nuts, seeds, or beans 5 times each week. ?2-3 servings of heart-healthy fats. Healthy fats called omega-3 fatty acids are found in foods such as walnuts, flaxseeds, fortified milks, and eggs. These fats are also  found in cold-water fish, such as sardines, salmon, and mackerel. ?Limit how much you eat of: ?Canned or prepackaged foods. ?Food that is high in trans fat, such as some fried foods. ?Food that is high in saturated fat, such as fatty meat. ?Desserts and other sweets, sugary drinks, and other foods with added sugar. ?Full-fat dairy products. ?Do not salt foods before eating. ?Do not eat more than 4 egg yolks a week. ?Try to eat at least 2 vegetarian meals a week. ?Eat more home-cooked food and less restaurant, buffet, and fast food. ?Lifestyle ?When eating at a restaurant, ask that your food be prepared with less salt or no salt, if possible. ?If you drink alcohol: ?Limit how much you use to: ?0-1 drink a day for women who are not pregnant. ?0-2 drinks a day for men. ?Be aware of how much alcohol is in your drink. In the [place] one drink equals one 12 oz bottle of beer (355 mL), one 5 oz glass of wine (148 mL), or one 1? oz glass of hard liquor (44 mL). ?General information ?Avoid eating more than 2,300 mg of salt a day. If you have hypertension, you may need to reduce your sodium intake to 1,500 mg a day. ?Work with your health care provider to maintain a healthy body weight or to lose weight. Ask what an ideal weight is for you. ?Get at least 30 minutes of exercise that causes your heart to beat faster (aerobic exercise) most days of the week. Activities may include walking, swimming, or biking. ?  Work with your health care provider or dietitian to adjust your eating plan to your individual calorie needs. ?What foods should I eat? ?Fruits ?All fresh, dried, or frozen fruit. Canned fruit in natural juice (without added sugar). ?Vegetables ?Fresh or frozen vegetables (raw, steamed, roasted, or grilled). Low-sodium or reduced-sodium tomato and vegetable juice. Low-sodium or reduced-sodium tomato sauce and tomato paste. Low-sodium or reduced-sodium canned vegetables. ?Grains ?Whole-grain or whole-wheat bread. Whole-grain  or whole-wheat pasta. Brown rice. [place]. Bulgur. Whole-grain and low-sodium cereals. Pita bread. Low-fat, low-sodium crackers. Whole-wheat flour tortillas. ?Meats and other proteins ?Skinless chicken or [place]. Ground chicken or [place]. Pork with fat trimmed off. Fish and seafood. Egg whites. Dried beans, peas, or lentils. Unsalted nuts, nut butters, and seeds. Unsalted canned beans. Lean cuts of beef with fat trimmed off. Low-sodium, lean precooked or cured meat, such as sausages or meat loaves. ?Dairy ?Low-fat (1%) or fat-free (skim) milk. Reduced-fat, low-fat, or fat-free cheeses. Nonfat, low-sodium ricotta or cottage cheese. Low-fat or nonfat yogurt. Low-fat, low-sodium cheese. ?Fats and oils ?Soft margarine without trans fats. Vegetable oil. Reduced-fat, low-fat, or light mayonnaise and salad dressings (reduced-sodium). Canola, safflower, olive, avocado, soybean, and sunflower oils. Avocado. ?Seasonings and condiments ?Herbs. Spices. Seasoning mixes without salt. ?Other foods ?Unsalted popcorn and pretzels. Fat-free sweets. ?The items listed above may not be a complete list of foods and beverages you can eat. Contact a dietitian for more information. ?What foods should I avoid? ?Fruits ?Canned fruit in a light or heavy syrup. Fried fruit. Fruit in cream or butter sauce. ?Vegetables ?Creamed or fried vegetables. Vegetables in a cheese sauce. Regular canned vegetables (not low-sodium or reduced-sodium). Regular canned tomato sauce and paste (not low-sodium or reduced-sodium). Regular tomato and vegetable juice (not low-sodium or reduced-sodium). Angie Fava. Olives. ?Grains ?Baked goods made with fat, such as croissants, muffins, or some breads. Dry pasta or rice meal packs. ?Meats and other proteins ?Fatty cuts of meat. Ribs. Fried meat. Berniece Salines. Bologna, salami, and other precooked or cured meats, such as sausages or meat loaves. Fat from the back of a pig (fatback). Bratwurst. Salted nuts and seeds. Canned  beans with added salt. Canned or smoked fish. Whole eggs or egg yolks. Chicken or [place] with skin. ?Dairy ?Whole or 2% milk, cream, and half-and-half. Whole or full-fat cream cheese. Whole-fat or sweetened yogurt. Full-fat cheese. Nondairy creamers. Whipped toppings. Processed cheese and cheese spreads. ?Fats and oils ?Butter. Stick margarine. Lard. Shortening. Ghee. Bacon fat. Tropical oils, such as coconut, palm kernel, or palm oil. ?Seasonings and condiments ?Onion salt, garlic salt, seasoned salt, table salt, and sea salt. Worcestershire sauce. Tartar sauce. Barbecue sauce. Teriyaki sauce. Soy sauce, including reduced-sodium. Steak sauce. Canned and packaged gravies. Fish sauce. Oyster sauce. Cocktail sauce. Store-bought horseradish. Ketchup. Mustard. Meat flavorings and tenderizers. Bouillon cubes. Hot sauces. Pre-made or packaged marinades. Pre-made or packaged taco seasonings. Relishes. Regular salad dressings. ?Other foods ?Salted popcorn and pretzels. ?The items listed above may not be a complete list of foods and beverages you should avoid. Contact a dietitian for more information. ?Where to find more information ?National Heart, Lung, and Blood Institute: https://wilson-eaton.com/ ?American Heart Association: www.heart.org ?Academy of Nutrition and Dietetics: www.eatright.org ?[place]: www.kidney.org ?Summary ?The DASH eating plan is a healthy eating plan that has been shown to reduce high blood pressure (hypertension). It may also reduce your risk for type 2 diabetes, heart disease, and stroke. ?When on the DASH eating plan, aim to eat more fresh fruits and vegetables, whole grains, lean  proteins, low-fat dairy, and heart-healthy fats. ?With the DASH eating plan, you should limit salt (sodium) intake to 2,300 mg a day. If you have hypertension, you may need to reduce your sodium intake to 1,500 mg a day. ?Work with your health care provider or dietitian to adjust your eating plan to your  individual calorie needs. ?This information is not intended to replace advice given to you by your health care provider. Make sure you discuss any questions you have with your health care provider. ?Doc

## 2021-11-25 NOTE — Assessment & Plan Note (Signed)
BP Readings from Last 3 Encounters:  ?11/25/21 134/86  ?11/23/21 (!) 155/102  ?09/22/21 135/75  ? ?Controlled on the amlodipine '5mg'$ , lisinopril 40 and bisoprolol 10 daily ?Recent labs fine ?

## 2021-11-25 NOTE — Assessment & Plan Note (Signed)
I have personally reviewed the Medicare Annual Wellness questionnaire and have noted ?1. The patient's medical and social history ?2. Their use of alcohol, tobacco or illicit drugs ?3. Their current medications and supplements ?4. The patient's functional ability including ADL's, fall risks, home safety risks and hearing or visual ?            impairment. ?5. Diet and physical activities ?6. Evidence for depression or mood disorders ? ?The patients weight, height, BMI and visual acuity have been recorded in the chart ?I have made referrals, counseling and provided education to the patient based review of the above and I have provided the pt with a written personalized care plan for preventive services. ? ?I have provided you with a copy of your personalized plan for preventive services. Please take the time to review along with your updated medication list. ? ?Colon due 2026 ?Will defer PSA to next time ?Needs to do some exercise ?Td at pharmacy ?Prefers no COVID vaccine ?Flu vaccine in the fall ?

## 2021-11-25 NOTE — Assessment & Plan Note (Signed)
Suggested he take tylenol arthritis 650 tid regularly ?

## 2021-11-25 NOTE — Assessment & Plan Note (Signed)
BMI 36 with HTN, arthritis, etc ?Will give DASH info ?Needs to exercise ?

## 2021-11-25 NOTE — Assessment & Plan Note (Signed)
Quiet on daily colchicine 0.6 daily ?

## 2021-11-25 NOTE — Telephone Encounter (Signed)
Called pt to let him know about the change. ?

## 2021-11-25 NOTE — Progress Notes (Signed)
Vision Screening  ? Right eye Left eye Both eyes  ?Without correction     ?With correction '20/20 20/20 20/15 '$  ?Hearing Screening - Comments:: Passed Whisper test ? ?

## 2021-12-01 DIAGNOSIS — M25512 Pain in left shoulder: Secondary | ICD-10-CM | POA: Diagnosis not present

## 2021-12-23 DIAGNOSIS — L821 Other seborrheic keratosis: Secondary | ICD-10-CM | POA: Diagnosis not present

## 2021-12-23 DIAGNOSIS — Z85828 Personal history of other malignant neoplasm of skin: Secondary | ICD-10-CM | POA: Diagnosis not present

## 2021-12-23 DIAGNOSIS — L570 Actinic keratosis: Secondary | ICD-10-CM | POA: Diagnosis not present

## 2021-12-23 DIAGNOSIS — L738 Other specified follicular disorders: Secondary | ICD-10-CM | POA: Diagnosis not present

## 2021-12-23 DIAGNOSIS — D0461 Carcinoma in situ of skin of right upper limb, including shoulder: Secondary | ICD-10-CM | POA: Diagnosis not present

## 2021-12-23 DIAGNOSIS — D485 Neoplasm of uncertain behavior of skin: Secondary | ICD-10-CM | POA: Diagnosis not present

## 2021-12-24 ENCOUNTER — Other Ambulatory Visit: Payer: Self-pay | Admitting: Internal Medicine

## 2022-02-09 ENCOUNTER — Other Ambulatory Visit: Payer: Self-pay | Admitting: Internal Medicine

## 2022-03-05 ENCOUNTER — Other Ambulatory Visit: Payer: Self-pay | Admitting: Internal Medicine

## 2022-03-21 ENCOUNTER — Other Ambulatory Visit: Payer: Self-pay | Admitting: Internal Medicine

## 2022-03-25 ENCOUNTER — Ambulatory Visit (INDEPENDENT_AMBULATORY_CARE_PROVIDER_SITE_OTHER): Payer: Medicare Other | Admitting: Internal Medicine

## 2022-03-25 ENCOUNTER — Other Ambulatory Visit: Payer: Self-pay

## 2022-03-25 ENCOUNTER — Encounter: Payer: Self-pay | Admitting: Internal Medicine

## 2022-03-25 VITALS — BP 147/97 | HR 65 | Resp 16 | Ht 72.0 in | Wt 267.0 lb

## 2022-03-25 DIAGNOSIS — T8450XS Infection and inflammatory reaction due to unspecified internal joint prosthesis, sequela: Secondary | ICD-10-CM | POA: Diagnosis not present

## 2022-03-25 DIAGNOSIS — Z792 Long term (current) use of antibiotics: Secondary | ICD-10-CM

## 2022-03-25 DIAGNOSIS — T8450XD Infection and inflammatory reaction due to unspecified internal joint prosthesis, subsequent encounter: Secondary | ICD-10-CM

## 2022-03-25 DIAGNOSIS — Z96612 Presence of left artificial shoulder joint: Secondary | ICD-10-CM

## 2022-03-25 MED ORDER — CEFADROXIL 500 MG PO CAPS: 500.0000 mg | ORAL_CAPSULE | Freq: Two times a day (BID) | ORAL | 6 refills | Status: DC

## 2022-03-25 NOTE — Addendum Note (Signed)
Addended byLaurice Record on: 03/25/2022 10:16 AM   Modules accepted: Level of Service
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
## 2022-03-26 LAB — CBC WITH DIFFERENTIAL/PLATELET
Absolute Monocytes: 770 cells/uL (ref 200–950)
Basophils Absolute: 39 cells/uL (ref 0–200)
Basophils Relative: 0.5 %
Eosinophils Absolute: 262 cells/uL (ref 15–500)
Eosinophils Relative: 3.4 %
HCT: 43.6 % (ref 38.5–50.0)
Hemoglobin: 14.5 g/dL (ref 13.2–17.1)
Lymphs Abs: 1363 cells/uL (ref 850–3900)
MCH: 29.2 pg (ref 27.0–33.0)
MCHC: 33.3 g/dL (ref 32.0–36.0)
MCV: 87.7 fL (ref 80.0–100.0)
MPV: 11.1 fL (ref 7.5–12.5)
Monocytes Relative: 10 %
Neutro Abs: 5267 cells/uL (ref 1500–7800)
Neutrophils Relative %: 68.4 %
Platelets: 207 10*3/uL (ref 140–400)
RBC: 4.97 10*6/uL (ref 4.20–5.80)
RDW: 13.2 % (ref 11.0–15.0)
Total Lymphocyte: 17.7 %
WBC: 7.7 10*3/uL (ref 3.8–10.8)

## 2022-03-26 LAB — COMPLETE METABOLIC PANEL WITH GFR
AG Ratio: 1.8 (calc) (ref 1.0–2.5)
ALT: 18 U/L (ref 9–46)
AST: 20 U/L (ref 10–35)
Albumin: 4.3 g/dL (ref 3.6–5.1)
Alkaline phosphatase (APISO): 102 U/L (ref 35–144)
BUN: 14 mg/dL (ref 7–25)
CO2: 27 mmol/L (ref 20–32)
Calcium: 10.5 mg/dL — ABNORMAL HIGH (ref 8.6–10.3)
Chloride: 108 mmol/L (ref 98–110)
Creat: 0.96 mg/dL (ref 0.70–1.35)
Globulin: 2.4 g/dL (calc) (ref 1.9–3.7)
Glucose, Bld: 101 mg/dL — ABNORMAL HIGH (ref 65–99)
Potassium: 4.2 mmol/L (ref 3.5–5.3)
Sodium: 142 mmol/L (ref 135–146)
Total Bilirubin: 0.7 mg/dL (ref 0.2–1.2)
Total Protein: 6.7 g/dL (ref 6.1–8.1)
eGFR: 86 mL/min/{1.73_m2} (ref >=60)

## 2022-03-26 LAB — C-REACTIVE PROTEIN: CRP: 5.6 mg/L (ref <8.0)

## 2022-03-26 LAB — SEDIMENTATION RATE: Sed Rate: 9 mm/h (ref 0–20)

## 2022-05-06 ENCOUNTER — Ambulatory Visit: Payer: Medicare Other | Admitting: Internal Medicine

## 2022-05-08 ENCOUNTER — Other Ambulatory Visit: Payer: Self-pay | Admitting: Internal Medicine

## 2022-07-01 ENCOUNTER — Telehealth: Payer: Medicare Other | Admitting: Family

## 2022-07-01 DIAGNOSIS — R6889 Other general symptoms and signs: Secondary | ICD-10-CM

## 2022-07-01 MED ORDER — BENZONATATE 100 MG PO CAPS: 100.0000 mg | ORAL_CAPSULE | Freq: Three times a day (TID) | ORAL | 0 refills | Status: DC | PRN

## 2022-07-01 MED ORDER — OSELTAMIVIR PHOSPHATE 75 MG PO CAPS: 75.0000 mg | ORAL_CAPSULE | Freq: Two times a day (BID) | ORAL | 0 refills | Status: DC

## 2022-07-01 NOTE — Progress Notes (Signed)
Virtual Visit Consent   Sean Garcia, you are scheduled for a virtual visit with a [facility] provider today. Just as with appointments in the office, your consent must be obtained to participate. Your consent will be active for this visit and any virtual visit you may have with one of our providers in the next 365 days. If you have a MyChart account, a copy of this consent can be sent to you electronically.  As this is a virtual visit, video technology does not allow for your provider to perform a traditional examination. This may limit your provider's ability to fully assess your condition. If your provider identifies any concerns that need to be evaluated in person or the need to arrange testing (such as labs, EKG, etc.), we will make arrangements to do so. Although advances in technology are sophisticated, we cannot ensure that it will always work on either your end or our end. If the connection with a video visit is poor, the visit may have to be switched to a telephone visit. With either a video or telephone visit, we are not always able to ensure that we have a secure connection.  By engaging in this virtual visit, you consent to the provision of healthcare and authorize for your insurance to be billed (if applicable) for the services provided during this visit. Depending on your insurance coverage, you may receive a charge related to this service.  I need to obtain your verbal consent now. Are you willing to proceed with your visit today? Sean Garcia has provided verbal consent on 07/01/2022 for a virtual visit (video or telephone). Evelina Dun, FNP  Date: 07/01/2022 5:14 PM  Virtual Visit via Video Note   I, Evelina Dun, connected with  Sean Garcia  ([id number], 02-19-1951) on 07/01/22 at  6:15 PM EST by a video-enabled telemedicine application and verified that I am speaking with the correct person using two identifiers.  Location: Patient: Virtual Visit Location Patient:  Home Provider: Virtual Visit Location Provider: Home Office   I discussed the limitations of evaluation and management by telemedicine and the availability of in person appointments. The patient expressed understanding and agreed to proceed.    History of Present Illness: Sean Garcia is a 70 y.o. who identifies as a male who was assigned male at birth, and is being seen today for dry cough that started yesterday.  HPI: Cough This is a new problem. The current episode started yesterday. The problem has been unchanged. The problem occurs every few minutes. The cough is Non-productive. Associated symptoms include chills, a fever, headaches, myalgias, nasal congestion and postnasal drip. Pertinent negatives include no ear congestion, ear pain, sore throat, shortness of breath or wheezing. He has tried rest for the symptoms. The treatment provided mild relief.    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, Disp:  , Rfl:    amLODipine (NORVASC) 5 MG tablet, TAKE 1 TABLET BY MOUTH EVERY DAY, Disp: 90 tablet, Rfl: 2   atorvastatin (LIPITOR) 20 MG tablet, TAKE 1 TABLET BY MOUTH EVERY DAY, Disp: 90 tablet, Rfl: 3   bisoprolol (ZEBETA) 10 MG tablet, TAKE 1 TABLET BY MOUTH EVERY DAY, Disp: 90 tablet, Rfl: 3   cefadroxil (DURICEF) 500 MG capsule, Take 1 capsule (500 mg total) by mouth 2 (two) times daily., Disp: 60 capsule, Rfl: 6   colchicine 0.6 MG tablet, TAKE 1 TABLET (0.6 MG TOTAL) BY MOUTH 2 (TWO) TIMES DAILY AS NEEDED., Disp: 180 tablet, Rfl: 0   lisinopril (ZESTRIL) 40 MG tablet, TAKE 1 TABLET BY MOUTH EVERY DAY, Disp: 90 tablet, Rfl: 3   mometasone (ELOCON) 0.1 % ointment, Apply topically 2 (two) times daily as needed., Disp: 45 g, Rfl: 1   vardenafil (LEVITRA) 20 MG tablet, Take 1 tablet (20 mg total) by mouth daily as needed., Disp: 10 tablet, Rfl: 11  Observations/Objective: Patient is well-developed, well-nourished in no acute distress.  Resting comfortably  at home.  Head is normocephalic, atraumatic.  No labored breathing.  Speech is clear and coherent with logical content.  Patient is alert and oriented at baseline.  Constant nonproductive cough  Assessment and Plan: 1. Flu-like symptoms - oseltamivir (TAMIFLU) 75 MG capsule; Take 1 capsule (75 mg total) by mouth 2 (two) times daily.  Dispense: 10 capsule; Refill: 0 - benzonatate (TESSALON PERLES) 100 MG capsule; Take 1 capsule (100 mg total) by mouth 3 (three) times daily as needed.  Dispense: 20 capsule; Refill: 0  Force fluids Tylenol as needed  Rest Follow up if symptoms worsen or do not improve   Follow Up Instructions: I discussed the assessment and treatment plan with the patient. The patient was provided an opportunity to ask questions and all were answered. The patient agreed with the plan and demonstrated an understanding of the instructions.  A copy of instructions were sent to the patient via MyChart unless otherwise noted  below.     The patient was advised to call back or seek an in-person evaluation if the symptoms worsen or if the condition fails to improve as anticipated.  Time:  I spent 8 minutes with the patient via telehealth technology discussing the above problems/concerns.    Evelina Dun, FNP

## 2022-07-28 DIAGNOSIS — Z85828 Personal history of other malignant neoplasm of skin: Secondary | ICD-10-CM | POA: Diagnosis not present

## 2022-07-28 DIAGNOSIS — L821 Other seborrheic keratosis: Secondary | ICD-10-CM | POA: Diagnosis not present

## 2022-07-28 DIAGNOSIS — L570 Actinic keratosis: Secondary | ICD-10-CM | POA: Diagnosis not present

## 2022-07-28 DIAGNOSIS — D485 Neoplasm of uncertain behavior of skin: Secondary | ICD-10-CM | POA: Diagnosis not present

## 2022-08-18 ENCOUNTER — Other Ambulatory Visit: Payer: Self-pay

## 2022-08-18 ENCOUNTER — Ambulatory Visit (INDEPENDENT_AMBULATORY_CARE_PROVIDER_SITE_OTHER): Payer: Medicare Other | Admitting: Internal Medicine

## 2022-08-18 ENCOUNTER — Encounter: Payer: Self-pay | Admitting: Internal Medicine

## 2022-08-18 VITALS — BP 159/99 | HR 64 | Resp 16 | Ht 72.0 in | Wt 272.0 lb

## 2022-08-18 DIAGNOSIS — T8450XD Infection and inflammatory reaction due to unspecified internal joint prosthesis, subsequent encounter: Secondary | ICD-10-CM | POA: Diagnosis not present

## 2022-08-18 DIAGNOSIS — Z96612 Presence of left artificial shoulder joint: Secondary | ICD-10-CM

## 2022-08-18 DIAGNOSIS — T8450XS Infection and inflammatory reaction due to unspecified internal joint prosthesis, sequela: Secondary | ICD-10-CM

## 2022-08-18 DIAGNOSIS — R936 Abnormal findings on diagnostic imaging of limbs: Secondary | ICD-10-CM
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
## 2022-08-19 DIAGNOSIS — Z85828 Personal history of other malignant neoplasm of skin: Secondary | ICD-10-CM | POA: Diagnosis not present

## 2022-08-19 DIAGNOSIS — D034 Melanoma in situ of scalp and neck: Secondary | ICD-10-CM | POA: Diagnosis not present

## 2022-08-19 DIAGNOSIS — L570 Actinic keratosis: Secondary | ICD-10-CM | POA: Diagnosis not present

## 2022-08-19 DIAGNOSIS — L821 Other seborrheic keratosis: Secondary | ICD-10-CM | POA: Diagnosis not present

## 2022-08-19 DIAGNOSIS — D485 Neoplasm of uncertain behavior of skin: Secondary | ICD-10-CM | POA: Diagnosis not present

## 2022-08-19 LAB — C-REACTIVE PROTEIN: CRP: 2.3 mg/L (ref <8.0)

## 2022-08-19 LAB — COMPLETE METABOLIC PANEL WITH GFR
AG Ratio: 1.8 (calc) (ref 1.0–2.5)
ALT: 15 U/L (ref 9–46)
AST: 18 U/L (ref 10–35)
Albumin: 4.2 g/dL (ref 3.6–5.1)
Alkaline phosphatase (APISO): 118 U/L (ref 35–144)
BUN: 16 mg/dL (ref 7–25)
CO2: 27 mmol/L (ref 20–32)
Calcium: 10.4 mg/dL — ABNORMAL HIGH (ref 8.6–10.3)
Chloride: 106 mmol/L (ref 98–110)
Creat: 0.92 mg/dL (ref 0.70–1.35)
Globulin: 2.3 g/dL (calc) (ref 1.9–3.7)
Glucose, Bld: 95 mg/dL (ref 65–99)
Potassium: 4.8 mmol/L (ref 3.5–5.3)
Sodium: 140 mmol/L (ref 135–146)
Total Bilirubin: 0.5 mg/dL (ref 0.2–1.2)
Total Protein: 6.5 g/dL (ref 6.1–8.1)
eGFR: 90 mL/min/{1.73_m2} (ref >=60)

## 2022-08-19 LAB — CBC WITH DIFFERENTIAL/PLATELET
Absolute Monocytes: 812 cells/uL (ref 200–950)
Basophils Absolute: 41 cells/uL (ref 0–200)
Basophils Relative: 0.5 %
Eosinophils Absolute: 271 cells/uL (ref 15–500)
Eosinophils Relative: 3.3 %
HCT: 40.4 % (ref 38.5–50.0)
Hemoglobin: 13.8 g/dL (ref 13.2–17.1)
Lymphs Abs: 1812 cells/uL (ref 850–3900)
MCH: 28.7 pg (ref 27.0–33.0)
MCHC: 34.2 g/dL (ref 32.0–36.0)
MCV: 84 fL (ref 80.0–100.0)
MPV: 11.4 fL (ref 7.5–12.5)
Monocytes Relative: 9.9 %
Neutro Abs: 5264 cells/uL (ref 1500–7800)
Neutrophils Relative %: 64.2 %
Platelets: 221 10*3/uL (ref 140–400)
RBC: 4.81 10*6/uL (ref 4.20–5.80)
RDW: 12.7 % (ref 11.0–15.0)
Total Lymphocyte: 22.1 %
WBC: 8.2 10*3/uL (ref 3.8–10.8)

## 2022-08-19 LAB — SEDIMENTATION RATE: Sed Rate: 11 mm/h (ref 0–20)

## 2022-09-02 DIAGNOSIS — D034 Melanoma in situ of scalp and neck: Secondary | ICD-10-CM | POA: Diagnosis not present

## 2022-09-02 DIAGNOSIS — Z85828 Personal history of other malignant neoplasm of skin: Secondary | ICD-10-CM | POA: Diagnosis not present

## 2022-09-02 DIAGNOSIS — L905 Scar conditions and fibrosis of skin: Secondary | ICD-10-CM | POA: Diagnosis not present

## 2022-09-27 ENCOUNTER — Ambulatory Visit (INDEPENDENT_AMBULATORY_CARE_PROVIDER_SITE_OTHER): Payer: Medicare Other | Admitting: Internal Medicine

## 2022-09-27 ENCOUNTER — Other Ambulatory Visit: Payer: Self-pay

## 2022-09-27 ENCOUNTER — Encounter: Payer: Self-pay | Admitting: Internal Medicine

## 2022-09-27 VITALS — BP 156/100 | HR 63 | Temp 97.6°F | Wt 273.2 lb

## 2022-09-27 DIAGNOSIS — T8484XD Pain due to internal orthopedic prosthetic devices, implants and grafts, subsequent encounter: Secondary | ICD-10-CM | POA: Diagnosis not present

## 2022-09-27 DIAGNOSIS — T8450XS Infection and inflammatory reaction due to unspecified internal joint prosthesis, sequela: Secondary | ICD-10-CM | POA: Diagnosis not present

## 2022-09-27 DIAGNOSIS — T8450XD Infection and inflammatory reaction due to unspecified internal joint prosthesis, subsequent encounter: Secondary | ICD-10-CM

## 2022-09-27 DIAGNOSIS — B999 Unspecified infectious disease: Secondary | ICD-10-CM | POA: Diagnosis not present

## 2022-09-27 DIAGNOSIS — Z96612 Presence of left artificial shoulder joint: Secondary | ICD-10-CM
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
## 2022-09-28 LAB — COMPLETE METABOLIC PANEL WITH GFR
AG Ratio: 1.6 (calc) (ref 1.0–2.5)
ALT: 13 U/L (ref 9–46)
AST: 15 U/L (ref 10–35)
Albumin: 4.2 g/dL (ref 3.6–5.1)
Alkaline phosphatase (APISO): 118 U/L (ref 35–144)
BUN: 17 mg/dL (ref 7–25)
CO2: 27 mmol/L (ref 20–32)
Calcium: 10.2 mg/dL (ref 8.6–10.3)
Chloride: 107 mmol/L (ref 98–110)
Creat: 0.9 mg/dL (ref 0.70–1.35)
Globulin: 2.7 g/dL (calc) (ref 1.9–3.7)
Glucose, Bld: 98 mg/dL (ref 65–99)
Potassium: 4.3 mmol/L (ref 3.5–5.3)
Sodium: 140 mmol/L (ref 135–146)
Total Bilirubin: 0.5 mg/dL (ref 0.2–1.2)
Total Protein: 6.9 g/dL (ref 6.1–8.1)
eGFR: 92 mL/min/{1.73_m2} (ref >=60)

## 2022-09-28 LAB — C-REACTIVE PROTEIN: CRP: 3.7 mg/L (ref <8.0)

## 2022-09-28 LAB — CBC WITH DIFFERENTIAL/PLATELET
Absolute Monocytes: 1003 cells/uL — ABNORMAL HIGH (ref 200–950)
Basophils Absolute: 53 cells/uL (ref 0–200)
Basophils Relative: 0.6 %
Eosinophils Absolute: 220 cells/uL (ref 15–500)
Eosinophils Relative: 2.5 %
HCT: 43.9 % (ref 38.5–50.0)
Hemoglobin: 14.4 g/dL (ref 13.2–17.1)
Lymphs Abs: 1760 cells/uL (ref 850–3900)
MCH: 28.1 pg (ref 27.0–33.0)
MCHC: 32.8 g/dL (ref 32.0–36.0)
MCV: 85.7 fL (ref 80.0–100.0)
MPV: 11 fL (ref 7.5–12.5)
Monocytes Relative: 11.4 %
Neutro Abs: 5764 cells/uL (ref 1500–7800)
Neutrophils Relative %: 65.5 %
Platelets: 236 10*3/uL (ref 140–400)
RBC: 5.12 10*6/uL (ref 4.20–5.80)
RDW: 12.8 % (ref 11.0–15.0)
Total Lymphocyte: 20 %
WBC: 8.8 10*3/uL (ref 3.8–10.8)

## 2022-09-28 LAB — SEDIMENTATION RATE: Sed Rate: 11 mm/h (ref 0–20)

## 2022-10-08 ENCOUNTER — Telehealth: Payer: Self-pay | Admitting: Internal Medicine

## 2022-10-08 NOTE — Telephone Encounter (Signed)
Called pt in regards to stable esr/crp off of abx x 1 month. He was restarted on suprresive cefadroxil while awawint labs. He states his joint pain has resolved  after restarting suppressive abx as such will continue. F.U on 03/29/23

## 2022-10-09 ENCOUNTER — Other Ambulatory Visit: Payer: Self-pay | Admitting: Internal Medicine

## 2022-10-26 DIAGNOSIS — L738 Other specified follicular disorders: Secondary | ICD-10-CM | POA: Diagnosis not present

## 2022-10-26 DIAGNOSIS — L570 Actinic keratosis: Secondary | ICD-10-CM | POA: Diagnosis not present

## 2022-10-26 DIAGNOSIS — Z85828 Personal history of other malignant neoplasm of skin: Secondary | ICD-10-CM | POA: Diagnosis not present

## 2022-10-26 DIAGNOSIS — D485 Neoplasm of uncertain behavior of skin: Secondary | ICD-10-CM | POA: Diagnosis not present

## 2022-10-26 DIAGNOSIS — L821 Other seborrheic keratosis: Secondary | ICD-10-CM | POA: Diagnosis not present

## 2022-10-26 DIAGNOSIS — L308 Other specified dermatitis: Secondary | ICD-10-CM | POA: Diagnosis not present

## 2022-11-08 ENCOUNTER — Other Ambulatory Visit: Payer: Self-pay | Admitting: Internal Medicine

## 2022-11-30 ENCOUNTER — Ambulatory Visit (INDEPENDENT_AMBULATORY_CARE_PROVIDER_SITE_OTHER): Payer: Medicare Other | Admitting: Internal Medicine

## 2022-11-30 ENCOUNTER — Encounter: Payer: Self-pay | Admitting: Internal Medicine

## 2022-11-30 VITALS — BP 120/86 | HR 72 | Temp 97.6°F | Ht 72.0 in | Wt 269.0 lb

## 2022-11-30 DIAGNOSIS — M159 Polyosteoarthritis, unspecified: Secondary | ICD-10-CM | POA: Diagnosis not present

## 2022-11-30 DIAGNOSIS — Z125 Encounter for screening for malignant neoplasm of prostate: Secondary | ICD-10-CM

## 2022-11-30 DIAGNOSIS — Z0000 Encounter for general adult medical examination without abnormal findings: Secondary | ICD-10-CM | POA: Diagnosis not present

## 2022-11-30 DIAGNOSIS — I10 Essential (primary) hypertension: Secondary | ICD-10-CM

## 2022-11-30 DIAGNOSIS — E785 Hyperlipidemia, unspecified: Secondary | ICD-10-CM

## 2022-11-30 LAB — LIPID PANEL
Cholesterol: 127 mg/dL (ref 0–200)
HDL: 33.2 mg/dL — ABNORMAL LOW (ref >39.00)
LDL Cholesterol: 79 mg/dL (ref 0–99)
NonHDL: 93.43
Total CHOL/HDL Ratio: 4
Triglycerides: 74 mg/dL (ref 0.0–149.0)
VLDL: 14.8 mg/dL (ref 0.0–40.0)

## 2022-11-30 LAB — PSA, MEDICARE: PSA: 0.6 ng/ml (ref 0.10–4.00)

## 2022-11-30 NOTE — Assessment & Plan Note (Signed)
BMI still over 35 with HTN, arthritis,etc Discussed exercise Cut out sweet tea and limit eating out

## 2022-11-30 NOTE — Progress Notes (Signed)
Subjective:    Patient ID: Sean Garcia, male    DOB: 16-Jan-1951, 72 y.o.   MRN: [id number]  HPI Here for Medicare wellness visit and follow up of chronic health conditions Reviewed advanced directives Reviewed other doctors---Dr Singh--Infectious Diseases, Dr Cathlean Cower, [facility], [facility] dental, Dr Tim Lair No surgery or hospitalizations this year (other than skin lesion removal) Vision is okay---needs exam Hearing is fine--no functional issues Not really exercising--discussed No alcohol or tobacco No falls No depression or anhedonia Independent with instrumental ADLs No memory problems of note  Left shoulder doing okay Still feels it in damp weather Was off the preventative antibiotic for a while---but pain restarted (so is taking it again) Able to golf Now has more trouble with right shoulder   Doesn't check BP No chest pain or SOB No dizziness or syncope Slight edema--nothing new (if prolonged sitting) No palpitations No headaches  BMI still 36 or 37 Mostly eats one big meal a day--some snacks Diet drinks or water at home---sweet tea out (discussed   Continues on the statin  Does take tylenol arthritis prior to golf Not regularly   Current Outpatient Medications on File Prior to Visit  Medication Sig Dispense Refill   acetaminophen (TYLENOL) 650 MG CR tablet Take 650 mg by mouth in the morning, at noon, and at bedtime.     amLODipine (NORVASC) 5 MG tablet TAKE 1 TABLET BY MOUTH EVERY DAY 90 tablet 2   atorvastatin (LIPITOR) 20 MG tablet TAKE 1 TABLET BY MOUTH EVERY DAY 90 tablet 3   bisoprolol (ZEBETA) 10 MG tablet TAKE 1 TABLET BY MOUTH EVERY DAY 90 tablet 3   cefadroxil (DURICEF) 500 MG capsule TAKE 1 CAPSULE BY MOUTH TWICE A DAY 60 capsule 6   colchicine 0.6 MG tablet TAKE 1 TABLET (0.6 MG TOTAL) BY MOUTH 2 (TWO) TIMES DAILY AS NEEDED. 180 tablet 0   lisinopril (ZESTRIL) 40 MG tablet TAKE 1 TABLET BY MOUTH EVERY DAY 90 tablet 3   mometasone  (ELOCON) 0.1 % ointment Apply topically 2 (two) times daily as needed. 45 g 1   vardenafil (LEVITRA) 20 MG tablet Take 1 tablet (20 mg total) by mouth daily as needed. 10 tablet 11   oseltamivir (TAMIFLU) 75 MG capsule Take 1 capsule (75 mg total) by mouth 2 (two) times daily. (Patient not taking: Reported on 09/27/2022) 10 capsule 0   No current facility-administered medications on file prior to visit.    Allergies  Allergen Reactions   Meloxicam     Stomach ulcer was developed   Codeine Itching and Rash    Past Medical History:  Diagnosis Date   Cancer ([facility])    Basal cell   ED (erectile dysfunction)    GERD (gastroesophageal reflux disease)    Gout    Hyperlipidemia    Hypertension    Obesity    Osteoarthritis     Past Surgical History:  Procedure Laterality Date   COLONOSCOPY  2009   benign polyp, rpt 10 yrs Russella Dar)   HARDWARE REMOVAL Left 08/26/2021   Procedure: HARDWARE REMOVAL;  Surgeon: Bjorn Pippin, MD;  Location: [facility];  Service: Orthopedics;  Laterality: Left;   KNEE SURGERY     for bowlegs, age 71   PATELLAR TENDON REPAIR  09/2008   left, Dr. Massie Maroon in [place]   SHOULDER ARTHROSCOPY Left 08/13/2021   Procedure: ARTHROSCOPY SHOULDER;  Surgeon: Bjorn Pippin, MD;  Location: [facility];  Service: Orthopedics;  Laterality: Left;  SYNOVECTOMY Left 08/26/2021   Procedure: SYNOVECTOMY;  Surgeon: Bjorn Pippin, MD;  Location: [facility];  Service: Orthopedics;  Laterality: Left;   SYNOVIAL BIOPSY Left 08/13/2021   Procedure: SYNOVIAL BIOPSY;  Surgeon: Bjorn Pippin, MD;  Location: [facility];  Service: Orthopedics;  Laterality: Left;   TOTAL KNEE ARTHROPLASTY  2007   bilateral   TOTAL SHOULDER ARTHROPLASTY Left 08/26/2021   Procedure: TOTAL SHOULDER ARTHROPLASTY/HEMI;  Surgeon: Bjorn Pippin, MD;  Location: [facility];  Service: Orthopedics;  Laterality: Left;   TOTAL SHOULDER REPLACEMENT  05/2009   left     Family History  Problem Relation Age  of Onset   Hyperlipidemia Mother    Cancer Mother        lymphoma   Coronary artery disease Father    Heart disease Father        CHF   Colon cancer Neg Hx    Colon polyps Neg Hx    Esophageal cancer Neg Hx    Stomach cancer Neg Hx    Rectal cancer Neg Hx     Social History   Socioeconomic History   Marital status: Married    Spouse name: Not on file   Number of children: 2   Years of education: Not on file   Highest education level: Not on file  Occupational History   Occupation: Administrator, [employer]    Employer: [employer]    Comment: Retired  Tobacco Use   Smoking status: Former    Passive exposure: Never   Smokeless tobacco: Never   Tobacco comments:    Quit in his 20's.  Vaping Use   Vaping Use: Never used  Substance and Sexual Activity   Alcohol use: No   Drug use: Not Currently   Sexual activity: Yes  Other Topics Concern   Not on file  Social History Narrative   Has living will   Wife, then daughter Efraim Kaufmann, would be health care POA   Would accept resuscitation   No long term life support (like ventilator or tube feeds) if cognitively unaware   Social Determinants of Health   Financial Resource Strain: Not on file  Food Insecurity: Not on file  Transportation Needs: Not on file  Physical Activity: Not on file  Stress: Not on file  Social Connections: Not on file  Intimate Partner Violence: Not on file   Review of Systems Sleeps well--5 hours at night and afternoon nap Wears seat belt Teeth are fine--keeps up with dentist No suspicious skin lesions now---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 Only occasional nocturia Has some tenderness in right testicle--also sensitive in right groin No sex No other joint issues other than shoulders      Objective:   Physical Exam Constitutional:      Appearance: Normal appearance.  HENT:      Mouth/Throat:     Pharynx: No oropharyngeal exudate or posterior oropharyngeal erythema.  Eyes:     Conjunctiva/sclera: Conjunctivae normal.     Pupils: Pupils are equal, round, and reactive to light.  Cardiovascular:     Rate and Rhythm: Normal rate and regular rhythm.     Pulses: Normal pulses.     Heart sounds: No murmur heard.    No gallop.  Pulmonary:     Effort: Pulmonary effort is normal.     Breath sounds: Normal breath sounds. No wheezing or  rales.  Abdominal:     Palpations: Abdomen is soft.     Tenderness: There is no abdominal tenderness.     Hernia: No hernia is present.  Genitourinary:    Comments: No hernia Testes feel normal--just slight tenderness on right Musculoskeletal:     Cervical back: Neck supple.     Right lower leg: No edema.     Left lower leg: No edema.  Lymphadenopathy:     Cervical: No cervical adenopathy.  Skin:    Findings: No lesion or rash.  Neurological:     General: No focal deficit present.     Mental Status: He is alert and oriented to person, place, and time.     Comments: Word naming-- 13/1 minute Recall 3/3  Psychiatric:        Mood and Affect: Mood normal.        Behavior: Behavior normal.            Assessment & Plan:

## 2022-11-30 NOTE — Assessment & Plan Note (Signed)
Mostly just uses tylenol before golf More trouble with right shoulder but not in a rush to surgery

## 2022-11-30 NOTE — Assessment & Plan Note (Signed)
BP Readings from Last 3 Encounters:  11/30/22 120/86  09/27/22 (!) 156/100  08/18/22 (!) 159/99   Good control on amlodipine 5mg  daily, lisinopril 40 daily, bisoprolol 10mg  daily Recent labs okay

## 2022-11-30 NOTE — Assessment & Plan Note (Signed)
No problems with atorvastatin 20mg  for primary prevention

## 2022-11-30 NOTE — Assessment & Plan Note (Signed)
I have personally reviewed the Medicare Annual Wellness questionnaire and have noted 1. The patient's medical and social history 2. Their use of alcohol, tobacco or illicit drugs 3. Their current medications and supplements 4. The patient's functional ability including ADL's, fall risks, home safety risks and hearing or visual             impairment. 5. Diet and physical activities 6. Evidence for depression or mood disorders  The patients weight, height, BMI and visual acuity have been recorded in the chart I have made referrals, counseling and provided education to the patient based review of the above and I have provided the pt with a written personalized care plan for preventive services.  I have provided you with a copy of your personalized plan for preventive services. Please take the time to review along with your updated medication list.  Colon due 2026 Will check PSA Discussed exercise Flu vaccine in the fall Needs to get tetanus booster at pharmacy Prefers no COVID vaccine. Recommended RSV in the fall also

## 2022-11-30 NOTE — Patient Instructions (Signed)
Please get your tetanus booster at the pharmacy. 

## 2022-12-18 ENCOUNTER — Other Ambulatory Visit: Payer: Self-pay | Admitting: Internal Medicine

## 2023-01-12 ENCOUNTER — Other Ambulatory Visit: Payer: Self-pay | Admitting: Internal Medicine

## 2023-02-01 ENCOUNTER — Other Ambulatory Visit: Payer: Self-pay | Admitting: Internal Medicine

## 2023-03-02 DIAGNOSIS — Z85820 Personal history of malignant melanoma of skin: Secondary | ICD-10-CM | POA: Diagnosis not present

## 2023-03-02 DIAGNOSIS — L304 Erythema intertrigo: Secondary | ICD-10-CM | POA: Diagnosis not present

## 2023-03-02 DIAGNOSIS — L821 Other seborrheic keratosis: Secondary | ICD-10-CM | POA: Diagnosis not present

## 2023-03-02 DIAGNOSIS — D1801 Hemangioma of skin and subcutaneous tissue: Secondary | ICD-10-CM | POA: Diagnosis not present

## 2023-03-02 DIAGNOSIS — L812 Freckles: Secondary | ICD-10-CM | POA: Diagnosis not present

## 2023-03-02 DIAGNOSIS — Z85828 Personal history of other malignant neoplasm of skin: Secondary | ICD-10-CM | POA: Diagnosis not present

## 2023-03-02 DIAGNOSIS — D485 Neoplasm of uncertain behavior of skin: Secondary | ICD-10-CM | POA: Diagnosis not present

## 2023-03-02 DIAGNOSIS — D225 Melanocytic nevi of trunk: Secondary | ICD-10-CM | POA: Diagnosis not present

## 2023-03-12 ENCOUNTER — Other Ambulatory Visit: Payer: Self-pay | Admitting: Internal Medicine

## 2023-03-23 ENCOUNTER — Ambulatory Visit (INDEPENDENT_AMBULATORY_CARE_PROVIDER_SITE_OTHER): Payer: Medicare Other | Admitting: Internal Medicine

## 2023-03-23 ENCOUNTER — Other Ambulatory Visit: Payer: Self-pay

## 2023-03-23 ENCOUNTER — Encounter: Payer: Self-pay | Admitting: Internal Medicine

## 2023-03-23 VITALS — BP 122/83 | HR 65 | Temp 97.8°F | Wt 277.0 lb

## 2023-03-23 DIAGNOSIS — Z8619 Personal history of other infectious and parasitic diseases: Secondary | ICD-10-CM

## 2023-03-23 DIAGNOSIS — T8450XS Infection and inflammatory reaction due to unspecified internal joint prosthesis, sequela: Secondary | ICD-10-CM | POA: Diagnosis not present

## 2023-03-23 DIAGNOSIS — B999 Unspecified infectious disease: Secondary | ICD-10-CM | POA: Diagnosis not present

## 2023-03-23 DIAGNOSIS — T8459XD Infection and inflammatory reaction due to other internal joint prosthesis, subsequent encounter: Secondary | ICD-10-CM
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
## 2023-03-24 LAB — COMPLETE METABOLIC PANEL WITH GFR
AG Ratio: 1.7 (calc) (ref 1.0–2.5)
ALT: 16 U/L (ref 9–46)
AST: 20 U/L (ref 10–35)
Albumin: 4 g/dL (ref 3.6–5.1)
Alkaline phosphatase (APISO): 114 U/L (ref 35–144)
BUN: 18 mg/dL (ref 7–25)
CO2: 25 mmol/L (ref 20–32)
Calcium: 10.1 mg/dL (ref 8.6–10.3)
Chloride: 106 mmol/L (ref 98–110)
Creat: 0.96 mg/dL (ref 0.70–1.28)
Globulin: 2.4 g/dL (ref 1.9–3.7)
Glucose, Bld: 94 mg/dL (ref 65–99)
Potassium: 4.9 mmol/L (ref 3.5–5.3)
Sodium: 139 mmol/L (ref 135–146)
Total Bilirubin: 0.6 mg/dL (ref 0.2–1.2)
Total Protein: 6.4 g/dL (ref 6.1–8.1)
eGFR: 85 mL/min/{1.73_m2} (ref >=60)

## 2023-03-24 LAB — CBC WITH DIFFERENTIAL/PLATELET
Absolute Monocytes: 974 {cells}/uL — ABNORMAL HIGH (ref 200–950)
Basophils Absolute: 44 {cells}/uL (ref 0–200)
Basophils Relative: 0.5 %
Eosinophils Absolute: 287 {cells}/uL (ref 15–500)
Eosinophils Relative: 3.3 %
HCT: 43 % (ref 38.5–50.0)
Hemoglobin: 14.1 g/dL (ref 13.2–17.1)
Lymphs Abs: 1740 {cells}/uL (ref 850–3900)
MCH: 28.3 pg (ref 27.0–33.0)
MCHC: 32.8 g/dL (ref 32.0–36.0)
MCV: 86.3 fL (ref 80.0–100.0)
MPV: 11.3 fL (ref 7.5–12.5)
Monocytes Relative: 11.2 %
Neutro Abs: 5655 {cells}/uL (ref 1500–7800)
Neutrophils Relative %: 65 %
Platelets: 226 10*3/uL (ref 140–400)
RBC: 4.98 10*6/uL (ref 4.20–5.80)
RDW: 12.5 % (ref 11.0–15.0)
Total Lymphocyte: 20 %
WBC: 8.7 10*3/uL (ref 3.8–10.8)

## 2023-03-24 LAB — C-REACTIVE PROTEIN: CRP: 4.2 mg/L (ref <8.0)

## 2023-03-24 LAB — SEDIMENTATION RATE: Sed Rate: 9 mm/h (ref 0–20)

## 2023-03-29 ENCOUNTER — Ambulatory Visit: Payer: Medicare Other | Admitting: Internal Medicine

## 2023-04-19 ENCOUNTER — Encounter: Payer: Self-pay | Admitting: Gastroenterology

## 2023-04-20 DIAGNOSIS — D485 Neoplasm of uncertain behavior of skin: Secondary | ICD-10-CM | POA: Diagnosis not present

## 2023-04-20 DIAGNOSIS — C44219 Basal cell carcinoma of skin of left ear and external auricular canal: Secondary | ICD-10-CM | POA: Diagnosis not present

## 2023-04-20 DIAGNOSIS — B354 Tinea corporis: Secondary | ICD-10-CM | POA: Diagnosis not present

## 2023-04-20 DIAGNOSIS — Z85828 Personal history of other malignant neoplasm of skin: Secondary | ICD-10-CM | POA: Diagnosis not present

## 2023-05-04 ENCOUNTER — Ambulatory Visit: Payer: Medicare Other | Admitting: *Deleted

## 2023-05-04 VITALS — Ht 72.0 in | Wt 266.0 lb

## 2023-05-04 DIAGNOSIS — Z860100 Personal history of colon polyps, unspecified: Secondary | ICD-10-CM

## 2023-05-04 MED ORDER — NA SULFATE-K SULFATE-MG SULF 17.5-3.13-1.6 GM/177ML PO SOLN: 1.0000 | Freq: Once | ORAL | 0 refills | Status: AC

## 2023-05-04 NOTE — Progress Notes (Signed)
Pt's name and DOB verified at the beginning of the pre-visit wit 2 identifiers  Pt denies any difficulty with ambulating,sitting, laying down or rolling side to side  Gave both [facility] main # and MD on call # prior to instructions.   No egg or soy allergy known to patient   No issues known to pt with past sedation with any surgeries or procedures  Pt denies having issues being intubatedmb  Pt has no issues moving head neck or swallowing  No FH of Malignant Hyperthermia  Pt is not on diet pills or shots  Pt is not on home 02   Pt is not on blood thinners   Pt denies issues with constipation   Pt is not on dialysis  Pt denise any abnormal heart rhythms   Pt denies any upcoming cardiac testing  Pt encouraged to use to use Singlecare or Goodrx to reduce cost   Patient's chart reviewed by Sean Garcia CNRA prior to pre-visit and patient appropriate for the [facility].  Pre-visit completed and red dot placed by patient's name on their procedure day (on provider's schedule).  .  Visit by phone  Pt states weight is 266 lb  Instructed pt why it is important to and  to call if they have any changes in health or new medications. Directed them to the # given and on instructions.     Instructions reviewed with pt and pt states understanding. Instructed to review again prior to procedure. Pt states they will.   Instructions sent by mail with coupon and by my chart

## 2023-05-11 ENCOUNTER — Other Ambulatory Visit: Payer: Self-pay | Admitting: Internal Medicine

## 2023-05-25 ENCOUNTER — Ambulatory Visit: Payer: Medicare Other | Admitting: Gastroenterology

## 2023-05-25 ENCOUNTER — Encounter: Payer: Self-pay | Admitting: Gastroenterology

## 2023-05-25 VITALS — BP 107/73 | HR 68 | Temp 98.8°F | Resp 12 | Ht 72.0 in | Wt 266.0 lb

## 2023-05-25 DIAGNOSIS — Z860100 Personal history of colon polyps, unspecified: Secondary | ICD-10-CM | POA: Diagnosis not present

## 2023-05-25 DIAGNOSIS — K635 Polyp of colon: Secondary | ICD-10-CM | POA: Diagnosis not present

## 2023-05-25 DIAGNOSIS — Z1211 Encounter for screening for malignant neoplasm of colon: Secondary | ICD-10-CM | POA: Diagnosis not present

## 2023-05-25 DIAGNOSIS — D125 Benign neoplasm of sigmoid colon: Secondary | ICD-10-CM | POA: Diagnosis not present

## 2023-05-25 DIAGNOSIS — E785 Hyperlipidemia, unspecified: Secondary | ICD-10-CM | POA: Diagnosis not present

## 2023-05-25 DIAGNOSIS — I10 Essential (primary) hypertension: Secondary | ICD-10-CM | POA: Diagnosis not present

## 2023-05-25 DIAGNOSIS — Z860101 Personal history of adenomatous and serrated colon polyps: Secondary | ICD-10-CM | POA: Diagnosis not present

## 2023-05-25 DIAGNOSIS — E669 Obesity, unspecified: Secondary | ICD-10-CM | POA: Diagnosis not present

## 2023-05-25 MED ORDER — SODIUM CHLORIDE 0.9 % IV SOLN: 500.0000 mL | Freq: Once | INTRAVENOUS | Status: DC

## 2023-05-25 NOTE — Progress Notes (Signed)
Sedate, gd SR, tolerated procedure well, VSS, report to RN 

## 2023-05-25 NOTE — Op Note (Signed)
[facility] Patient Name: Sean Garcia Procedure Date: 05/25/2023 8:28 AM MRN: [id number] Endoscopist: Meryl Dare , MD, [phone] Age: 72 Referring MD:  Date of Birth: [birth year] Gender: Male Account #: [id number] Procedure:                Colonoscopy Indications:              Surveillance: Personal history of adenomatous                            polyps on last colonoscopy 5 years ago Medicines:                Monitored Anesthesia Care Procedure:                Pre-Anesthesia Assessment:                           - Prior to the procedure, a History and Physical                            was performed, and patient medications and                            allergies were reviewed. The patient's tolerance of                            previous anesthesia was also reviewed. The risks                            and benefits of the procedure and the sedation                            options and risks were discussed with the patient.                            All questions were answered, and informed consent                            was obtained. Prior Anticoagulants: The patient has                            taken no anticoagulant or antiplatelet agents. ASA                            Grade Assessment: II - A patient with mild systemic                            disease. After reviewing the risks and benefits,                            the patient was deemed in satisfactory condition to                            undergo the procedure.  After obtaining informed consent, the colonoscope                            was passed under direct vision. Throughout the                            procedure, the patient's blood pressure, pulse, and                            oxygen saturations were monitored continuously. The                            Olympus Scope [id number] was introduced through the                            anus and advanced to  the the cecum, identified by                            appendiceal orifice and ileocecal valve. The                            ileocecal valve, appendiceal orifice, and rectum                            were photographed. The quality of the bowel                            preparation was good. The colonoscopy was performed                            without difficulty. The patient tolerated the                            procedure well. Scope In: 8:43:42 AM Scope Out: 9:01:37 AM Scope Withdrawal Time: 0 hours 11 minutes 13 seconds  Total Procedure Duration: 0 hours 17 minutes 55 seconds  Findings:                 The perianal and digital rectal examinations were                            normal.                           A 7 mm polyp was found in the sigmoid colon. The                            polyp was sessile. The polyp was removed with a                            cold snare. Resection and retrieval were complete.                           External and internal hemorrhoids were found during  retroflexion. The hemorrhoids were small and Grade                            I (internal hemorrhoids that do not prolapse).                           The exam was otherwise without abnormality on                            direct and retroflexion views. Complications:            No immediate complications. Estimated blood loss:                            None. Estimated Blood Loss:     Estimated blood loss: none. Impression:               - One 7 mm polyp in the sigmoid colon, removed with                            a cold snare. Resected and retrieved.                           - External and internal hemorrhoids.                           - The examination was otherwise normal on direct                            and retroflexion views. Recommendation:           - Repeat colonoscopy after studies are complete for                            surveillance based on  pathology results.                           - Patient has a contact number available for                            emergencies. The signs and symptoms of potential                            delayed complications were discussed with the                            patient. Return to normal activities tomorrow.                            Written discharge instructions were provided to the                            patient.                           - Resume previous diet.                           -  Continue present medications.                           - Await pathology results. Meryl Dare, MD 05/25/2023 9:04:42 AM This report has been signed electronically.

## 2023-05-25 NOTE — Patient Instructions (Signed)
Discharge instructions given. Handouts on polyps and Hemorrhoids. Resume previous medications. YOU HAD AN ENDOSCOPIC PROCEDURE TODAY AT THE [facility]:   Refer to the procedure report that was given to you for any specific questions about what was found during the examination.  If the procedure report does not answer your questions, please call your gastroenterologist to clarify.  If you requested that your care partner not be given the details of your procedure findings, then the procedure report has been included in a sealed envelope for you to review at your convenience later.  YOU SHOULD EXPECT: Some feelings of bloating in the abdomen. Passage of more gas than usual.  Walking can help get rid of the air that was put into your GI tract during the procedure and reduce the bloating. If you had a lower endoscopy (such as a colonoscopy or flexible sigmoidoscopy) you may notice spotting of blood in your stool or on the toilet paper. If you underwent a bowel prep for your procedure, you may not have a normal bowel movement for a few days.  Please Note:  You might notice some irritation and congestion in your nose or some drainage.  This is from the oxygen used during your procedure.  There is no need for concern and it should clear up in a day or so.  SYMPTOMS TO REPORT IMMEDIATELY:  Following lower endoscopy (colonoscopy or flexible sigmoidoscopy):  Excessive amounts of blood in the stool  Significant tenderness or worsening of abdominal pains  Swelling of the abdomen that is new, acute  Fever of 100F or higher   For urgent or emergent issues, a gastroenterologist can be reached at any hour by calling [phone]. Do not use MyChart messaging for urgent concerns.    DIET:  We do recommend a small meal at first, but then you may proceed to your regular diet.  Drink plenty of fluids but you should avoid alcoholic beverages for 24 hours.  ACTIVITY:  You should plan to take it  easy for the rest of today and you should NOT DRIVE or use heavy machinery until tomorrow (because of the sedation medicines used during the test).    FOLLOW UP: Our staff will call the number listed on your records the next business day following your procedure.  We will call around 7:15- 8:00 am to check on you and address any questions or concerns that you may have regarding the information given to you following your procedure. If we do not reach you, we will leave a message.     If any biopsies were taken you will be contacted by phone or by letter within the next 1-3 weeks.  Please call us at [phone] if you have not heard about the biopsies in 3 weeks.    SIGNATURES/CONFIDENTIALITY: You and/or your care partner have signed paperwork which will be entered into your electronic medical record.  These signatures attest to the fact that that the information above on your After Visit Summary has been reviewed and is understood.  Full responsibility of the confidentiality of this discharge information lies with you and/or your care-partner. 

## 2023-05-25 NOTE — Progress Notes (Signed)
Pt's states no medical or surgical changes since previsit or office visit. 

## 2023-05-25 NOTE — Progress Notes (Signed)
History & Physical  Primary Care Physician:  Karie Schwalbe, MD Primary Gastroenterologist: Claudette Head, MD  Impression / Plan:  Personal history of adenomatous colon polyps.  CHIEF COMPLAINT:  Personal history of colon polyps   HPI: Sean Garcia is a 72 y.o. male with a personal history of adenomatous colon polyps for colonoscopy.    Past Medical History:  Diagnosis Date   Cancer [facility])    Basal cell   Cataract    ED (erectile dysfunction)    GERD (gastroesophageal reflux disease)    Gout    Hyperlipidemia    Hypertension    Kidney stones    Obesity    Osteoarthritis     Past Surgical History:  Procedure Laterality Date   COLONOSCOPY  2009   benign polyp, rpt 10 yrs Russella Dar)   HARDWARE REMOVAL Left 08/26/2021   Procedure: HARDWARE REMOVAL;  Surgeon: Bjorn Pippin, MD;  Location: [facility];  Service: Orthopedics;  Laterality: Left;   KNEE SURGERY     for bowlegs, age 42   PATELLAR TENDON REPAIR  09/2008   left, Dr. Massie Maroon in [place]   SHOULDER ARTHROSCOPY Left 08/13/2021   Procedure: ARTHROSCOPY SHOULDER;  Surgeon: Bjorn Pippin, MD;  Location: [facility];  Service: Orthopedics;  Laterality: Left;   SYNOVECTOMY Left 08/26/2021   Procedure: SYNOVECTOMY;  Surgeon: Bjorn Pippin, MD;  [facility]: [facility];  Service: Orthopedics;  Laterality: Left;   SYNOVIAL BIOPSY Left 08/13/2021   Procedure: SYNOVIAL BIOPSY;  Surgeon: Bjorn Pippin, MD;  Location: [facility];  Service: Orthopedics;  Laterality: Left;   TOTAL KNEE ARTHROPLASTY  2007   bilateral   TOTAL SHOULDER ARTHROPLASTY Left 08/26/2021   Procedure: TOTAL SHOULDER ARTHROPLASTY/HEMI;  Surgeon: Bjorn Pippin, MD;  [facility];  Service: Orthopedics;  Laterality: Left;   TOTAL SHOULDER REPLACEMENT  05/2009   left     Prior to Admission medications   Medication Sig Start Date End Date Taking? Authorizing Provider  acetaminophen (TYLENOL) 650 MG CR tablet Take 650 mg by mouth in the  morning, at noon, and at bedtime.   Yes [provider]  amLODipine (NORVASC) 5 MG tablet TAKE 1 TABLET BY MOUTH EVERY DAY 12/20/22  Yes Tillman Abide I, MD  atorvastatin (LIPITOR) 20 MG tablet TAKE 1 TABLET BY MOUTH EVERY DAY 02/01/23  Yes Tillman Abide I, MD  bisoprolol (ZEBETA) 10 MG tablet TAKE 1 TABLET BY MOUTH EVERY DAY 03/14/23  Yes Tillman Abide I, MD  cefadroxil (DURICEF) 500 MG capsule TAKE 1 CAPSULE BY MOUTH TWICE A DAY 05/11/23  Yes Danelle Earthly, MD  cholecalciferol (VITAMIN D3) 25 MCG (1000 UNIT) tablet Take 1,000 Units by mouth daily.   Yes [provider]  lisinopril (ZESTRIL) 40 MG tablet TAKE 1 TABLET BY MOUTH EVERY DAY 10/11/22  Yes Karie Schwalbe, MD  OVER THE COUNTER MEDICATION Black seed oil 2 tsp QD   Yes [provider]  amoxicillin (AMOXIL) 500 MG capsule Take 500 mg by mouth. With dental procedure 05/11/23   [provider]  colchicine 0.6 MG tablet TAKE 1 TABLET (0.6 MG TOTAL) BY MOUTH 2 (TWO) TIMES DAILY AS NEEDED. 01/12/23   Karie Schwalbe, MD  desoximetasone (TOPICORT) 0.25 % cream APPLY SPARINGLY TO AFFECTED AREA TWICE A DAY 01/12/23   [provider]  hydrocortisone 2.5 % cream Apply  as directed to affected area twice a day 03/18/23   [provider]  ketoconazole (  NIZORAL) 2 % cream SMARTSIG:1 Sparingly Topical Twice Daily 03/02/23   [provider]  mometasone (ELOCON) 0.1 % ointment Apply topically 2 (two) times daily as needed. 11/25/21   Karie Schwalbe, MD    Current Outpatient Medications  Medication Sig Dispense Refill   acetaminophen (TYLENOL) 650 MG CR tablet Take 650 mg by mouth in the morning, at noon, and at bedtime.     amLODipine (NORVASC) 5 MG tablet TAKE 1 TABLET BY MOUTH EVERY DAY 90 tablet 3   atorvastatin (LIPITOR) 20 MG tablet TAKE 1 TABLET BY MOUTH EVERY DAY 90 tablet 3   bisoprolol (ZEBETA) 10 MG tablet TAKE 1 TABLET BY MOUTH EVERY DAY 90 tablet 3   cefadroxil (DURICEF)  500 MG capsule TAKE 1 CAPSULE BY MOUTH TWICE A DAY 60 capsule 6   cholecalciferol (VITAMIN D3) 25 MCG (1000 UNIT) tablet Take 1,000 Units by mouth daily.     lisinopril (ZESTRIL) 40 MG tablet TAKE 1 TABLET BY MOUTH EVERY DAY 90 tablet 3   OVER THE COUNTER MEDICATION Black seed oil 2 tsp QD     amoxicillin (AMOXIL) 500 MG capsule Take 500 mg by mouth. With dental procedure     colchicine 0.6 MG tablet TAKE 1 TABLET (0.6 MG TOTAL) BY MOUTH 2 (TWO) TIMES DAILY AS NEEDED. 60 tablet 2   desoximetasone (TOPICORT) 0.25 % cream APPLY SPARINGLY TO AFFECTED AREA TWICE A DAY     hydrocortisone 2.5 % cream Apply  as directed to affected area twice a day     ketoconazole (NIZORAL) 2 % cream SMARTSIG:1 Sparingly Topical Twice Daily     mometasone (ELOCON) 0.1 % ointment Apply topically 2 (two) times daily as needed. 45 g 1   Current Facility-Administered Medications  Medication Dose Route Frequency Provider Last Rate Last Admin   0.9 %  sodium chloride infusion  500 mL Intravenous Once Meryl Dare, MD        Allergies as of 05/25/2023 - Review Complete 05/25/2023  Allergen Reaction Noted   Meloxicam  02/20/2021   Codeine Itching and Rash 10/05/2007    Family History  Problem Relation Age of Onset   Hyperlipidemia Mother    Cancer Mother        lymphoma   Coronary artery disease Father    Heart disease Father        CHF   Colon cancer Neg Hx    Colon polyps Neg Hx    Esophageal cancer Neg Hx    Stomach cancer Neg Hx    Rectal cancer Neg Hx     Social History   Socioeconomic History   Marital status: Married    Spouse name: Not on file   Number of children: 2   Years of education: Not on file   Highest education level: Not on file  Occupational History   Occupation: Administrator, [employer]    Employer: [employer]    Comment: Retired  Tobacco Use   Smoking status: Former    Types: Cigarettes    Start date: 1977    Passive exposure: Never    Smokeless tobacco: Never   Tobacco comments:    Quit in his 20's.  Vaping Use   Vaping status: Never Used  Substance and Sexual Activity   Alcohol use: No   Drug use: Never   Sexual activity: Yes  Other Topics Concern   Not on file  Social History Narrative   Has living will  Wife, then daughter Efraim Kaufmann, would be health care POA   Would accept resuscitation   No long term life support (like ventilator or tube feeds) if cognitively unaware   Social Determinants of Health   Financial Resource Strain: Not on file  Food Insecurity: Not on file  Transportation Needs: Not on file  Physical Activity: Not on file  Stress: Not on file  Social Connections: Not on file  Intimate Partner Violence: Not on file    Review of Systems:  All systems reviewed were negative except where noted in HPI.   Physical Exam:  General:  Alert, well-developed, in NAD Head:  Normocephalic and atraumatic. Eyes:  Sclera clear, no icterus.   Conjunctiva pink. Ears:  Normal auditory acuity. Mouth:  No deformity or lesions.  Neck:  Supple; no masses. Lungs:  Clear throughout to auscultation.   No wheezes, crackles, or rhonchi.  Heart:  Regular rate and rhythm; no murmurs. Abdomen:  Soft, nondistended, nontender. No masses, hepatomegaly. No palpable masses.  Normal bowel sounds.    Rectal:  Deferred   Msk:  Symmetrical without gross deformities. Extremities:  Without edema. Neurologic:  Alert and  oriented x 4; grossly normal neurologically. Skin:  Intact without significant lesions or rashes. Psych:  Alert and cooperative. Normal mood and affect.   Venita Lick. Russella Dar  05/25/2023, 8:29 AM See Loretha Stapler, [facility], to contact our on call provider

## 2023-05-26 ENCOUNTER — Telehealth: Payer: Self-pay | Admitting: *Deleted

## 2023-05-26 NOTE — Telephone Encounter (Signed)
  Follow up Call-     05/25/2023    7:44 AM  Call back number  Post procedure Call Back phone  # [phone]  Permission to leave phone message Yes     Patient questions:   Message left to call us if necessary.

## 2023-05-27 LAB — SURGICAL PATHOLOGY

## 2023-05-31 ENCOUNTER — Encounter: Payer: Self-pay | Admitting: Gastroenterology

## 2023-06-09 DIAGNOSIS — C44219 Basal cell carcinoma of skin of left ear and external auricular canal: Secondary | ICD-10-CM | POA: Diagnosis not present

## 2023-06-09 DIAGNOSIS — Z85828 Personal history of other malignant neoplasm of skin: Secondary | ICD-10-CM | POA: Diagnosis not present

## 2023-08-09 ENCOUNTER — Telehealth (INDEPENDENT_AMBULATORY_CARE_PROVIDER_SITE_OTHER): Payer: Medicare Other | Admitting: Internal Medicine

## 2023-08-09 ENCOUNTER — Encounter: Payer: Self-pay | Admitting: Internal Medicine

## 2023-08-09 ENCOUNTER — Ambulatory Visit: Payer: Self-pay | Admitting: Internal Medicine

## 2023-08-09 DIAGNOSIS — J111 Influenza due to unidentified influenza virus with other respiratory manifestations: Secondary | ICD-10-CM | POA: Insufficient documentation

## 2023-08-09 MED ORDER — BENZONATATE 200 MG PO CAPS: 200.0000 mg | ORAL_CAPSULE | Freq: Three times a day (TID) | ORAL | 0 refills | Status: DC | PRN

## 2023-08-09 NOTE — Progress Notes (Signed)
Subjective:    Patient ID: Sean Garcia, male    DOB: 08-15-1950, 73 y.o.   MRN: [id number]  HPI Video virtual visit due to respiratory infection Identification done Reviewed limitations and billing and he gave consent Participants---patient in his home and I am in my office  Fee;s "terrible" Started 4 days ago--with sinus drainage The next day got worse Tried coricidin for flu--cough, sore throat, headache Sore throat is the worst--and really hurts with cough Pain with swallowing No fever, chills, sweats or myalgias No SOB  OTC meds not helping  Current Outpatient Medications on File Prior to Visit  Medication Sig Dispense Refill   acetaminophen (TYLENOL) 650 MG CR tablet Take 650 mg by mouth in the morning, at noon, and at bedtime.     amLODipine (NORVASC) 5 MG tablet TAKE 1 TABLET BY MOUTH EVERY DAY 90 tablet 3   amoxicillin (AMOXIL) 500 MG capsule Take 500 mg by mouth. With dental procedure     atorvastatin (LIPITOR) 20 MG tablet TAKE 1 TABLET BY MOUTH EVERY DAY 90 tablet 3   bisoprolol (ZEBETA) 10 MG tablet TAKE 1 TABLET BY MOUTH EVERY DAY 90 tablet 3   cefadroxil (DURICEF) 500 MG capsule Take 500 mg by mouth 2 (two) times daily.     cholecalciferol (VITAMIN D3) 25 MCG (1000 UNIT) tablet Take 1,000 Units by mouth daily.     colchicine 0.6 MG tablet TAKE 1 TABLET (0.6 MG TOTAL) BY MOUTH 2 (TWO) TIMES DAILY AS NEEDED. 60 tablet 2   desoximetasone (TOPICORT) 0.25 % cream APPLY SPARINGLY TO AFFECTED AREA TWICE A DAY     hydrocortisone 2.5 % cream Apply  as directed to affected area twice a day     ketoconazole (NIZORAL) 2 % cream SMARTSIG:1 Sparingly Topical Twice Daily     lisinopril (ZESTRIL) 40 MG tablet TAKE 1 TABLET BY MOUTH EVERY DAY 90 tablet 3   mometasone (ELOCON) 0.1 % ointment Apply topically 2 (two) times daily as needed. 45 g 1   OVER THE COUNTER MEDICATION Black seed oil 2 tsp QD     No current facility-administered medications on file prior to visit.     Allergies  Allergen Reactions   Meloxicam     Stomach ulcer was developed   Codeine Itching and Rash    Past Medical History:  Diagnosis Date   Cancer (HCC)    Basal cell   Cataract    ED (erectile dysfunction)    GERD (gastroesophageal reflux disease)    Gout    Hyperlipidemia    Hypertension    Kidney stones    Obesity    Osteoarthritis     Past Surgical History:  Procedure Laterality Date   COLONOSCOPY  2009   benign polyp, rpt 10 yrs [facility]ella Dar)   HARDWARE REMOVAL Left 08/26/2021   Procedure: HARDWARE REMOVAL;  Surgeon: Bjorn Pippin, MD;  [facility];  Service: Orthopedics;  Laterality: Left;   KNEE SURGERY     for bowlegs, age 28   PATELLAR TENDON REPAIR  09/2008   left, Dr. Massie Maroon in [place]   SHOULDER ARTHROSCOPY Left 08/13/2021   Procedure: ARTHROSCOPY SHOULDER;  Surgeon: Bjorn Pippin, MD;  Location: [facility];  Service: Orthopedics;  Laterality: Left;   SYNOVECTOMY Left 08/26/2021   Procedure: SYNOVECTOMY;  Surgeon: Bjorn Pippin, MD;  [facility];  Service: Orthopedics;  Laterality: Left;   SYNOVIAL BIOPSY Left 08/13/2021   Procedure: SYNOVIAL BIOPSY;  Surgeon: Ramond Marrow  T, MD;  Location: [facility];  Service: Orthopedics;  Laterality: Left;   TOTAL KNEE ARTHROPLASTY  2007   bilateral   TOTAL SHOULDER ARTHROPLASTY Left 08/26/2021   Procedure: TOTAL SHOULDER ARTHROPLASTY/HEMI;  Surgeon: Bjorn Pippin, MD;  [facility];  Service: Orthopedics;  Laterality: Left;   TOTAL SHOULDER REPLACEMENT  05/2009   left     Family History  Problem Relation Age of Onset   Hyperlipidemia Mother    Cancer Mother        lymphoma   Coronary artery disease Father    Heart disease Father        CHF   Colon cancer Neg Hx    Colon polyps Neg Hx    Esophageal cancer Neg Hx    Stomach cancer Neg Hx    Rectal cancer Neg Hx     Social History   Socioeconomic History   Marital status: Married    Spouse name: Not on file   Number  of children: 2   Years of education: Not on file   Highest education level: Not on file  Occupational History   Occupation: Administrator, [employer]    Employer: [employer]    Comment: Retired  Tobacco Use   Smoking status: Former    Types: Cigarettes    Start date: 1977    Passive exposure: Never   Smokeless tobacco: Never   Tobacco comments:    Quit in his 20's.  Vaping Use   Vaping status: Never Used  Substance and Sexual Activity   Alcohol use: No   Drug use: Never   Sexual activity: Yes  Other Topics Concern   Not on file  Social History Narrative   Has living will   Wife, then daughter Efraim Kaufmann, would be health care POA   Would accept resuscitation   No long term life support (like ventilator or tube feeds) if cognitively unaware   Social Drivers of Corporate investment banker Strain: Not on file  Food Insecurity: Not on file  Transportation Needs: Not on file  Physical Activity: Not on file  Stress: Not on file  Social Connections: Not on file  Intimate Partner Violence: Not on file   Review of Systems No loss of smell or taste No N/V Able to eat--but not much appetite    Objective:   Physical Exam Constitutional:      General: He is not in acute distress.    Comments: Looks mildly ill and uncomfortable  Pulmonary:     Effort: Pulmonary effort is normal. No respiratory distress.  Neurological:     Mental Status: He is alert.            Assessment & Plan:

## 2023-08-09 NOTE — Telephone Encounter (Signed)
Copied from CRM [id number]. Topic: Clinical - Pink Word Triage >> Aug 09, 2023  9:12 AM Efraim Kaufmann C wrote: Reason for Triage: since Friday patient has had a stopped up head, coughing, severe sore throat- painful to swallow. Patient looking for something that can help alleviate these symptoms   Chief Complaint: Possible sinus infection Symptoms: Sore throat, cough, sneezing, congestion, sinus pressure headaches Frequency: x 3-4 days Pertinent Negatives: Patient denies difficulty breathing, fevers,  Disposition: [] ED /[] Urgent Care (no appt availability in office) / [x] Appointment(In office/virtual)/ []  [facility]/ [] Home Care/ [] Refused Recommended Disposition /[] [facility]/ []  Follow-up with PCP Additional Notes: Patient states that he started feeling bad since Friday (3-4 days ago).  He states he has been taking over the counter medications.  Patient states that he has a very sore throat and is also coughing up yellow/green mucous. He also has had congestion, sinus pressure, sinus headaches, and sneezing. Patient wanted to have a virtual appointment for this possible sinus infection and was advised that if virtual was not an option, someone would call him right back.  He states he has had sinus infections before and his PCP was familiar with him and his history with same.  Appointment is made virtually with his PCP for today 08/09/2023 at Patient is given some home care advice and advised if things worsen he can go to the emergency room.  Patient verbalized understanding.  Reason for Disposition  Lots of coughing  Answer Assessment - Initial Assessment Questions 1. LOCATION: "Where does it hurt?"      head 2. ONSET: "When did the sinus pain start?"  (e.g., hours, days)      3-4 days ago 3. SEVERITY: "How bad is the pain?"   (Scale 1-10; mild, moderate or severe)   - MILD (1-3): doesn't interfere with normal activities    - MODERATE (4-7): interferes with normal activities  (e.g., work or school) or awakens from sleep   - SEVERE (8-10): excruciating pain and patient unable to do any normal activities        3 for headache and 10 for the cough and sore throat 4. RECURRENT SYMPTOM: "Have you ever had sinus problems before?" If Yes, ask: "When was the last time?" and "What happened that time?"      Yes 5. NASAL CONGESTION: "Is the nose blocked?" If Yes, ask: "Can you open it or must you breathe through your mouth?"     Yes 6. NASAL DISCHARGE: "Do you have discharge from your nose?" If so ask, "What color?"     Clear/yellow/green 7. FEVER: "Do you have a fever?" If Yes, ask: "What is it, how was it measured, and when did it start?"      No 8. OTHER SYMPTOMS: "Do you have any other symptoms?" (e.g., sore throat, cough, earache, difficulty breathing)     Some ear aches, cough, sore throat  Protocols used: Sinus Pain or Congestion-A-AH

## 2023-08-09 NOTE — Telephone Encounter (Signed)
Seen at virtual visit---see note

## 2023-08-09 NOTE — Assessment & Plan Note (Signed)
Didn't take flu vaccine this year Could also be COVID Is on suppressive cephadroxil ---so doubt he has secondary bacterial infection at this point He needs analgesics tylenol &/or advil/aleve ---most important Will send rx for benzonatate Would consider changing to different antibiotic if not better next week

## 2023-09-01 ENCOUNTER — Telehealth: Payer: Self-pay | Admitting: Internal Medicine

## 2023-09-01 NOTE — Telephone Encounter (Signed)
Good morning!  We wanted to reach out to you in regards to your upcoming appointment on 09/14/2023 with Dr. Thedore Mins. Unfortunately, she will be out of the office on this day and we will need to get this rescheduled for you.    If you could give our office a call at [phone], select the option for scheduling, and we will be happy to help you!   Thanks  Claris Che

## 2023-09-05 ENCOUNTER — Encounter: Payer: Self-pay | Admitting: Internal Medicine

## 2023-09-05 ENCOUNTER — Telehealth (INDEPENDENT_AMBULATORY_CARE_PROVIDER_SITE_OTHER): Payer: Medicare Other | Admitting: Internal Medicine

## 2023-09-05 DIAGNOSIS — M2042 Other hammer toe(s) (acquired), left foot: Secondary | ICD-10-CM | POA: Diagnosis not present

## 2023-09-05 NOTE — Progress Notes (Signed)
Subjective:    Patient ID: Sean Garcia, male    DOB: 1951-03-03, 73 y.o.   MRN: [id number]  HPI Here due to trouble with left 2nd toe--?hammertoe Identification done Reviewed limitations and insurance and he gave consent Participants--patient in his home and I am in my office  1 week ago--was in meeting all day Noticed pain in left 2nd toe--burning When he took off shoe and sock--very red and inflamed Some swelling Slight improvement--but still red and inflamed Tried tylenol and elevated foot No soaks but did try aspercreme No open areas or discharge  No new shoes but did wear a leather shoe the day before The day it began--was wearing a mesh shoe (less tight)  Current Outpatient Medications on File Prior to Visit  Medication Sig Dispense Refill   acetaminophen (TYLENOL) 650 MG CR tablet Take 650 mg by mouth in the morning, at noon, and at bedtime.     amLODipine (NORVASC) 5 MG tablet TAKE 1 TABLET BY MOUTH EVERY DAY 90 tablet 3   amoxicillin (AMOXIL) 500 MG capsule Take 500 mg by mouth. With dental procedure     atorvastatin (LIPITOR) 20 MG tablet TAKE 1 TABLET BY MOUTH EVERY DAY 90 tablet 3   bisoprolol (ZEBETA) 10 MG tablet TAKE 1 TABLET BY MOUTH EVERY DAY 90 tablet 3   cefadroxil (DURICEF) 500 MG capsule Take 500 mg by mouth 2 (two) times daily.     cholecalciferol (VITAMIN D3) 25 MCG (1000 UNIT) tablet Take 1,000 Units by mouth daily.     colchicine 0.6 MG tablet TAKE 1 TABLET (0.6 MG TOTAL) BY MOUTH 2 (TWO) TIMES DAILY AS NEEDED. 60 tablet 2   desoximetasone (TOPICORT) 0.25 % cream APPLY SPARINGLY TO AFFECTED AREA TWICE A DAY     hydrocortisone 2.5 % cream Apply  as directed to affected area twice a day     ketoconazole (NIZORAL) 2 % cream SMARTSIG:1 Sparingly Topical Twice Daily     lisinopril (ZESTRIL) 40 MG tablet TAKE 1 TABLET BY MOUTH EVERY DAY 90 tablet 3   OVER THE COUNTER MEDICATION Black seed oil 2 tsp QD     No current facility-administered medications on  file prior to visit.    Allergies  Allergen Reactions   Meloxicam     Stomach ulcer was developed   Codeine Itching and Rash    Past Medical History:  Diagnosis Date   Cancer (HCC)    Basal cell   Cataract    ED (erectile dysfunction)    GERD (gastroesophageal reflux disease)    Gout    Hyperlipidemia    Hypertension    Kidney stones    Obesity    Osteoarthritis     Past Surgical History:  Procedure Laterality Date   COLONOSCOPY  2009   benign polyp, rpt 10 yrs Russella Dar)   HARDWARE REMOVAL Left 08/26/2021   Procedure: HARDWARE REMOVAL;  Surgeon: Bjorn Pippin, MD;  [facility];  Service: Orthopedics;  Laterality: Left;   KNEE SURGERY     for bowlegs, age 76   PATELLAR TENDON REPAIR  09/2008   left, Dr. Massie Maroon in [place]   SHOULDER ARTHROSCOPY Left 08/13/2021   Procedure: ARTHROSCOPY SHOULDER;  Surgeon: Bjorn Pippin, MD;  Location: [facility];  Service: Orthopedics;  Laterality: Left;   SYNOVECTOMY Left 08/26/2021   Procedure: SYNOVECTOMY;  Surgeon: Bjorn Pippin, MD;  [facility];  Service: Orthopedics;  Laterality: Left;   SYNOVIAL BIOPSY Left 08/13/2021  Procedure: SYNOVIAL BIOPSY;  Surgeon: Bjorn Pippin, MD;  Location: [facility];  Service: Orthopedics;  Laterality: Left;   TOTAL KNEE ARTHROPLASTY  2007   bilateral   TOTAL SHOULDER ARTHROPLASTY Left 08/26/2021   Procedure: TOTAL SHOULDER ARTHROPLASTY/HEMI;  Surgeon: Bjorn Pippin, MD;  [facility];  Service: Orthopedics;  Laterality: Left;   TOTAL SHOULDER REPLACEMENT  05/2009   left     Family History  Problem Relation Age of Onset   Hyperlipidemia Mother    Cancer Mother        lymphoma   Coronary artery disease Father    Heart disease Father        CHF   Colon cancer Neg Hx    Colon polyps Neg Hx    Esophageal cancer Neg Hx    Stomach cancer Neg Hx    Rectal cancer Neg Hx     Social History   Socioeconomic History   Marital status: Married    Spouse name:  Not on file   Number of children: 2   Years of education: Not on file   Highest education level: Not on file  Occupational History   Occupation: Administrator, [employer]    Employer: [employer]    Comment: Retired  Tobacco Use   Smoking status: Former    Types: Cigarettes    Start date: 1977    Passive exposure: Never   Smokeless tobacco: Never   Tobacco comments:    Quit in his 20's.  Vaping Use   Vaping status: Never Used  Substance and Sexual Activity   Alcohol use: No   Drug use: Never   Sexual activity: Yes  Other Topics Concern   Not on file  Social History Narrative   Has living will   Wife, then daughter Efraim Kaufmann, would be health care POA   Would accept resuscitation   No long term life support (like ventilator or tube feeds) if cognitively unaware   Social Drivers of Corporate investment banker Strain: Not on file  Food Insecurity: Not on file  Transportation Needs: Not on file  Physical Activity: Not on file  Stress: Not on file  Social Connections: Not on file  Intimate Partner Violence: Not on file   Review of Systems No fever On amoxicillin for shoulder prophylaxis--and cephadroxil bid    Objective:   Physical Exam Musculoskeletal:     Comments: Significant hammertoe left 2nd toe Moderate redness but he reports it is not tender            Assessment & Plan:

## 2023-09-05 NOTE — Assessment & Plan Note (Addendum)
Hard to tell if infected---but still fairly inflamed Discussed getting pads to protect it Continues on cephadroxil bid--likely why things have improved Could try heat or lidocaine gel Consider podiatry evaluation--for needle procedure (recommended not doing surgery)

## 2023-09-21 ENCOUNTER — Ambulatory Visit: Payer: Medicare Other | Admitting: Internal Medicine

## 2023-10-20 ENCOUNTER — Ambulatory Visit: Admitting: Internal Medicine

## 2023-10-24 ENCOUNTER — Encounter: Payer: Self-pay | Admitting: Internal Medicine

## 2023-10-24 ENCOUNTER — Ambulatory Visit: Admitting: Internal Medicine

## 2023-10-24 ENCOUNTER — Other Ambulatory Visit: Payer: Self-pay

## 2023-10-24 VITALS — BP 113/81 | HR 78 | Resp 16 | Ht 72.0 in | Wt 277.8 lb

## 2023-10-24 DIAGNOSIS — B999 Unspecified infectious disease: Secondary | ICD-10-CM

## 2023-10-24 DIAGNOSIS — T8459XD Infection and inflammatory reaction due to other internal joint prosthesis, subsequent encounter: Secondary | ICD-10-CM

## 2023-10-24 DIAGNOSIS — T8450XS Infection and inflammatory reaction due to unspecified internal joint prosthesis, sequela: Secondary | ICD-10-CM

## 2023-10-24 MED ORDER — CEFADROXIL 500 MG PO CAPS
500.0000 mg | ORAL_CAPSULE | Freq: Two times a day (BID) | ORAL | 9 refills | Status: AC
Start: 2023-10-24 — End: ?
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
## 2023-10-25 LAB — CBC WITH DIFFERENTIAL/PLATELET
Absolute Lymphocytes: 1740 {cells}/uL (ref 850–3900)
Absolute Monocytes: 732 {cells}/uL (ref 200–950)
Basophils Absolute: 54 {cells}/uL (ref 0–200)
Basophils Relative: 0.7 %
Eosinophils Absolute: 362 {cells}/uL (ref 15–500)
Eosinophils Relative: 4.7 %
HCT: 43.4 % (ref 38.5–50.0)
Hemoglobin: 14.1 g/dL (ref 13.2–17.1)
MCH: 28.4 pg (ref 27.0–33.0)
MCHC: 32.5 g/dL (ref 32.0–36.0)
MCV: 87.3 fL (ref 80.0–100.0)
MPV: 11 fL (ref 7.5–12.5)
Monocytes Relative: 9.5 %
Neutro Abs: 4813 {cells}/uL (ref 1500–7800)
Neutrophils Relative %: 62.5 %
Platelets: 235 10*3/uL (ref 140–400)
RBC: 4.97 10*6/uL (ref 4.20–5.80)
RDW: 12.8 % (ref 11.0–15.0)
Total Lymphocyte: 22.6 %
WBC: 7.7 10*3/uL (ref 3.8–10.8)

## 2023-10-25 LAB — COMPLETE METABOLIC PANEL WITHOUT GFR
AG Ratio: 1.4 (calc) (ref 1.0–2.5)
ALT: 16 U/L (ref 9–46)
AST: 17 U/L (ref 10–35)
Albumin: 3.9 g/dL (ref 3.6–5.1)
Alkaline phosphatase (APISO): 112 U/L (ref 35–144)
BUN: 15 mg/dL (ref 7–25)
CO2: 27 mmol/L (ref 20–32)
Calcium: 10.6 mg/dL — ABNORMAL HIGH (ref 8.6–10.3)
Chloride: 105 mmol/L (ref 98–110)
Creat: 0.98 mg/dL (ref 0.70–1.28)
Globulin: 2.7 g/dL (ref 1.9–3.7)
Glucose, Bld: 97 mg/dL (ref 65–99)
Potassium: 4.6 mmol/L (ref 3.5–5.3)
Sodium: 139 mmol/L (ref 135–146)
Total Bilirubin: 0.4 mg/dL (ref 0.2–1.2)
Total Protein: 6.6 g/dL (ref 6.1–8.1)

## 2023-10-25 LAB — C-REACTIVE PROTEIN: CRP: 3.1 mg/L (ref <8.0)

## 2023-10-25 LAB — SEDIMENTATION RATE: Sed Rate: 11 mm/h (ref 0–20)

## 2023-11-03 ENCOUNTER — Encounter: Payer: Self-pay | Admitting: Internal Medicine

## 2023-11-18 ENCOUNTER — Telehealth: Payer: Self-pay
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
## 2023-11-18 NOTE — Telephone Encounter (Signed)
 Copied from CRM [id number]. Topic: General - Other >> Nov 18, 2023  3:39 PM Aisha D wrote: Reason for CRM: Patient is calling to see what his blood type is. Patient stated that he needs that information by end of business day and would like to receive a callback.

## 2023-11-24 ENCOUNTER — Encounter (HOSPITAL_COMMUNITY): Payer: Self-pay

## 2023-11-30 ENCOUNTER — Encounter: Admitting: Internal Medicine

## 2023-12-01 ENCOUNTER — Ambulatory Visit: Admitting: Internal Medicine

## 2023-12-01 ENCOUNTER — Ambulatory Visit

## 2023-12-01 ENCOUNTER — Other Ambulatory Visit: Payer: Self-pay | Admitting: Internal Medicine

## 2023-12-01 ENCOUNTER — Encounter: Payer: Self-pay | Admitting: Internal Medicine

## 2023-12-01 ENCOUNTER — Ambulatory Visit: Payer: Self-pay | Admitting: Internal Medicine

## 2023-12-01 VITALS — BP 110/60 | HR 68 | Temp 98.5°F | Ht 72.25 in | Wt 256.0 lb

## 2023-12-01 DIAGNOSIS — Z125 Encounter for screening for malignant neoplasm of prostate: Secondary | ICD-10-CM | POA: Diagnosis not present

## 2023-12-01 DIAGNOSIS — M1000 Idiopathic gout, unspecified site: Secondary | ICD-10-CM | POA: Diagnosis not present

## 2023-12-01 DIAGNOSIS — Z0000 Encounter for general adult medical examination without abnormal findings: Secondary | ICD-10-CM

## 2023-12-01 DIAGNOSIS — M2042 Other hammer toe(s) (acquired), left foot: Secondary | ICD-10-CM

## 2023-12-01 DIAGNOSIS — Z1159 Encounter for screening for other viral diseases: Secondary | ICD-10-CM | POA: Diagnosis not present

## 2023-12-01 DIAGNOSIS — I10 Essential (primary) hypertension: Secondary | ICD-10-CM

## 2023-12-01 DIAGNOSIS — E785 Hyperlipidemia, unspecified: Secondary | ICD-10-CM | POA: Diagnosis not present

## 2023-12-01 LAB — LIPID PANEL
Cholesterol: 129 mg/dL (ref 0–200)
HDL: 31.5 mg/dL — ABNORMAL LOW (ref >39.00)
LDL Cholesterol: 78 mg/dL (ref 0–99)
NonHDL: 97.54
Total CHOL/HDL Ratio: 4
Triglycerides: 97 mg/dL (ref 0.0–149.0)
VLDL: 19.4 mg/dL (ref 0.0–40.0)

## 2023-12-01 LAB — PSA, MEDICARE: PSA: 0.75 ng/mL (ref 0.10–4.00)

## 2023-12-01 MED ORDER — VARDENAFIL HCL 20 MG PO TABS: 20.0000 mg | ORAL_TABLET | Freq: Every day | ORAL | 11 refills | Status: AC | PRN

## 2023-12-01 NOTE — Assessment & Plan Note (Signed)
 Worse Will refer to podiatry

## 2023-12-01 NOTE — Assessment & Plan Note (Signed)
 I have personally reviewed the Medicare Annual Wellness questionnaire and have noted 1. The patient's medical and social history 2. Their use of alcohol, tobacco or illicit drugs 3. Their current medications and supplements 4. The patient's functional ability including ADL's, fall risks, home safety risks and hearing or visual             impairment. 5. Diet and physical activities 6. Evidence for depression or mood disorders  The patients weight, height, BMI and visual acuity have been recorded in the chart I have made referrals, counseling and provided education to the patient based review of the above and I have provided the pt with a written personalized care plan for preventive services.  I have provided you with a copy of your personalized plan for preventive services. Please take the time to review along with your updated medication list.  Done with screening colonoscopies One last PSA today Hopefully can increase his exercise Td at pharmacy and flu vaccine in the fall Prefers no COVID vaccine

## 2023-12-01 NOTE — Progress Notes (Signed)
 Subjective:    Patient ID: Sean Garcia, male    DOB: [birth year], 73 y.o.   MRN: [id number]  HPI Here for Medicare wellness visit and follow up of chronic health conditions Reviewed advanced directives Reviewed other doctors---Dr Singh--Infectious Disease, Dr Arminda Berth (now retired), Dr Sibyl Drafts, Dr Viki Graver, [facility], [facility] (Dr Janean Meals) No surgery or hospitalizations in the past year Vision is okay Hearing is fine No alcohol or tobacco No falls No depression or anhedonia Independent with instrumental ADLs No memory issues  No chest pain or SOB No dizziness or syncope No palpitations Slight edema if prolonged sitting--compression socks hurt the toe  Has made some lifestyle changes--less food, rare dessert Lost 22# very recently due to this  Having trouble with his hammertoe--so not walking  Has ED--this is disappointing Interested in medication for this again  Occasional flare of gout Colchicine  is effective  Current Outpatient Medications on File Prior to Visit  Medication Sig Dispense Refill   acetaminophen  (TYLENOL ) 650 MG CR tablet Take 650 mg by mouth in the morning, at noon, and at bedtime.     amLODipine  (NORVASC ) 5 MG tablet TAKE 1 TABLET BY MOUTH EVERY DAY 90 tablet 3   amoxicillin  (AMOXIL ) 500 MG capsule Take 500 mg by mouth. With dental procedure     atorvastatin  (LIPITOR) 20 MG tablet TAKE 1 TABLET BY MOUTH EVERY DAY 90 tablet 3   bisoprolol  (ZEBETA ) 10 MG tablet TAKE 1 TABLET BY MOUTH EVERY DAY 90 tablet 3   cefadroxil  (DURICEF) 500 MG capsule Take 1 capsule (500 mg total) by mouth 2 (two) times daily. 60 capsule 9   cholecalciferol (VITAMIN D3) 25 MCG (1000 UNIT) tablet Take 1,000 Units by mouth daily.     colchicine  0.6 MG tablet TAKE 1 TABLET (0.6 MG TOTAL) BY MOUTH 2 (TWO) TIMES DAILY AS NEEDED. 60 tablet 2   desoximetasone  (TOPICORT ) 0.25 % cream APPLY SPARINGLY TO AFFECTED AREA TWICE A DAY     hydrocortisone 2.5 % cream Apply   as directed to affected area twice a day     ketoconazole (NIZORAL) 2 % cream SMARTSIG:1 Sparingly Topical Twice Daily     lisinopril  (ZESTRIL ) 40 MG tablet TAKE 1 TABLET BY MOUTH EVERY DAY 90 tablet 3   OVER THE COUNTER MEDICATION Black seed oil 2 tsp QD     No current facility-administered medications on file prior to visit.    Allergies  Allergen Reactions   Meloxicam      Stomach ulcer was developed   Codeine Itching and Rash    Past Medical History:  Diagnosis Date   Cancer (HCC)    Basal cell   Cataract    ED (erectile dysfunction)    GERD (gastroesophageal reflux disease)    Gout    Hyperlipidemia    Hypertension    Kidney stones    Obesity    Osteoarthritis     Past Surgical History:  Procedure Laterality Date   COLONOSCOPY  2009   benign polyp, rpt 10 yrs Sandrea Cruel)   HARDWARE REMOVAL Left 08/26/2021   Procedure: HARDWARE REMOVAL;  Surgeon: Micheline Ahr, MD;  Location: [facility];  Service: Orthopedics;  Laterality: Left;   KNEE SURGERY     for bowlegs, age 16   PATELLAR TENDON REPAIR  09/2008   left, Dr. Denver Flaming in [place]   SHOULDER ARTHROSCOPY Left 08/13/2021   Procedure: ARTHROSCOPY SHOULDER;  Surgeon: Micheline Ahr, MD;  Location: [facility];  Service: Orthopedics;  Laterality: Left;   SYNOVECTOMY Left 08/26/2021   Procedure: SYNOVECTOMY;  Surgeon: Micheline Ahr, MD;  Location: [facility];  Service: Orthopedics;  Laterality: Left;   SYNOVIAL BIOPSY Left 08/13/2021   Procedure: SYNOVIAL BIOPSY;  Surgeon: Micheline Ahr, MD;  Location: [facility];  Service: Orthopedics;  Laterality: Left;   TOTAL KNEE ARTHROPLASTY  2007   bilateral   TOTAL SHOULDER ARTHROPLASTY Left 08/26/2021   Procedure: TOTAL SHOULDER ARTHROPLASTY/HEMI;  Surgeon: Micheline Ahr, MD;  Location: [facility];  Service: Orthopedics;  Laterality: Left;   TOTAL SHOULDER REPLACEMENT  05/2009   left     Family History  Problem Relation Age of Onset   Hyperlipidemia Mother    Cancer  Mother        lymphoma   Coronary artery disease Father    Heart disease Father        CHF   Colon cancer Neg Hx    Colon polyps Neg Hx    Esophageal cancer Neg Hx    Stomach cancer Neg Hx    Rectal cancer Neg Hx     Social History   Socioeconomic History   Marital status: Married    Spouse name: Not on file   Number of children: 2   Years of education: Not on file   Highest education level: Not on file  Occupational History   Occupation: Administrator, [employer]    Employer: [employer]    Comment: Retired  Tobacco Use   Smoking status: Former    Types: Cigarettes    Start date: 1977    Passive exposure: Never   Smokeless tobacco: Never   Tobacco comments:    Quit in his 20's.  Vaping Use   Vaping status: Never Used  Substance and Sexual Activity   Alcohol use: No   Drug use: Never   Sexual activity: Yes  Other Topics Concern   Not on file  Social History Narrative   Has living will   Wife, then daughter Lovett Ruck, would be health care POA   Would accept resuscitation   No long term life support (like ventilator or tube feeds) if cognitively unaware   Social Drivers of Corporate investment banker Strain: Not on file  Food Insecurity: Not on file  Transportation Needs: Not on file  Physical Activity: Not on file  Stress: Not on file  Social Connections: Not on file  Intimate Partner Violence: Not on file   Review of Systems Controlling appetite  Sleeps okay Wears seat belt Teeth are fine---keeps up with dentist No heartburn or dysphagia Bowels move fine--no blood Voids okay--flow is fine. Empties okay No sig back or joint pains---just the shoulder issues (Black seed oil seems to have helped) No suspicious skin lesions    Objective:   Physical Exam Constitutional:      Appearance: Normal appearance.  HENT:     Mouth/Throat:     Pharynx: No oropharyngeal exudate or posterior oropharyngeal erythema.  Eyes:      Conjunctiva/sclera: Conjunctivae normal.     Pupils: Pupils are equal, round, and reactive to light.  Cardiovascular:     Rate and Rhythm: Normal rate and regular rhythm.     Pulses: Normal pulses.     Heart sounds: No murmur heard.    No gallop.  Pulmonary:     Effort: Pulmonary effort is normal.     Breath sounds: Normal breath sounds. No wheezing or rales.  Abdominal:     Palpations: Abdomen is soft.     Tenderness: There is no abdominal tenderness.  Musculoskeletal:     Cervical back: Neck supple.     Right lower leg: No edema.     Left lower leg: No edema.     Comments: Left 2nd hammertoe worse  Lymphadenopathy:     Cervical: No cervical adenopathy.  Skin:    Findings: No lesion or rash.  Neurological:     General: No focal deficit present.     Mental Status: He is alert and oriented to person, place, and time.     Comments: Mini -cog---normal  Psychiatric:        Mood and Affect: Mood normal.        Behavior: Behavior normal.            Assessment & Plan:

## 2023-12-01 NOTE — Assessment & Plan Note (Signed)
 BP Readings from Last 3 Encounters:  12/01/23 110/60  10/24/23 113/81  05/25/23 107/73   Controlled on amlodipine  5mg  daily and lisinopril  40mg  daily

## 2023-12-01 NOTE — Assessment & Plan Note (Signed)
 Uses colchicine  prn with success

## 2023-12-01 NOTE — Assessment & Plan Note (Signed)
 No problems with atorvastatin  20 daily for primary prevention

## 2023-12-01 NOTE — Progress Notes (Signed)
 Vision Screening   Right eye Left eye Both eyes  Without correction     With correction 20/20 20/20 20/15   Hearing Screening - Comments:: Passed whisper test

## 2023-12-03 LAB — HEPATITIS C ANTIBODY: Hepatitis C Ab: NONREACTIVE

## 2023-12-07 ENCOUNTER — Encounter: Payer: Medicare Other | Admitting: Internal Medicine

## 2023-12-21 ENCOUNTER — Encounter: Payer: Self-pay | Admitting: Podiatry

## 2023-12-21 ENCOUNTER — Ambulatory Visit (INDEPENDENT_AMBULATORY_CARE_PROVIDER_SITE_OTHER): Admitting: Podiatry

## 2023-12-21 ENCOUNTER — Ambulatory Visit (INDEPENDENT_AMBULATORY_CARE_PROVIDER_SITE_OTHER)

## 2023-12-21 DIAGNOSIS — M2042 Other hammer toe(s) (acquired), left foot: Secondary | ICD-10-CM | POA: Diagnosis not present

## 2023-12-21 NOTE — Patient Instructions (Signed)
  VISIT SUMMARY: Today, we discussed the issues you are experiencing with hammer toes on your left foot. We reviewed your condition and the necessary surgical interventions to improve your foot function and reduce discomfort. We also discussed the risks and benefits of the procedures and the postoperative care required.  YOUR PLAN: -RIGID HAMMER TOE OF LEFT SECOND TOE: A rigid hammer toe is a condition where the toe is permanently bent downward, causing pain and discomfort. Your left second toe has a severe form of this condition, and surgery is needed to shorten the bone and fix the joint, the bone (metatarsal) behind it will need to be surgically shortened as well. This will help improve function and reduce discomfort when wearing shoes. The surgery will be done under deep sedation, and you will need to wear a walking boot for 6-8 weeks after the procedure. Risks include infection and slow bone healing, but these are rare.  -SEMI-RIGID HAMMER TOE OF LEFT THIRD TOE: A semi-rigid hammer toe is a condition where the toe is partially bent and can still move slightly. Your left third toe has this condition, and surgery is recommended to prevent it from getting worse and to improve function. The surgery will involve bone work and possibly screw fixation. You will need to wear a walking boot for 6-8 weeks after the procedure. Risks include infection and slow bone healing, but these are rare.  -FLEXIBLE HAMMER TOES OF LEFT FOURTH AND FIFTH TOES: Flexible hammer toes are a condition where the toes are bent but can still be straightened. Your left fourth and fifth toes have this condition, and a tendon release procedure is recommended to improve alignment and prevent the condition from getting worse. This procedure is less invasive and involves releasing the flexor tendons. You will need to wear a walking boot for 6-8 weeks after the procedure. Risks include infection and slow bone healing, but these are  rare.  INSTRUCTIONS: Please schedule your outpatient surgeries at [facility]. Follow the postoperative care instructions, including wearing a walking boot for 6-8 weeks. If you have any questions or concerns, feel free to contact our office.                      Contains text generated by Abridge.                                 Contains text generated by Abridge.

## 2023-12-22 ENCOUNTER — Encounter: Payer: Self-pay | Admitting: Podiatry

## 2023-12-22 NOTE — Progress Notes (Signed)
 Subjective:  Patient ID: Sean Garcia, male    DOB: [birth year],  MRN: [id number]  Chief Complaint  Patient presents with   Toe Pain    "I have a Hammer Toe on my left foot."    Discussed the use of AI scribe software for clinical note transcription with the patient, who gave verbal consent to proceed.  History of Present Illness Sean Garcia is a 73 year old male who presents with a rigid hammer toe. He was referred by Dr. Salem Crater for evaluation of his hammer toe.  He has been experiencing issues with a hammer toe for decades, particularly affecting the second toe on his left foot. The condition is severe, causing pain when wearing street shoes that are even slightly narrow, leading to tenderness and inflammation. He recalls a period when the toe became very red and inflamed, prompting a consultation with his primary care physician via a Zoom call.  He reports no major health issues such as diabetes, heart attack, or stroke. He does not smoke and occasionally drinks alcohol. He is married and lives with his wife. He does not have to navigate stairs frequently at home.      Objective:    Physical Exam VASCULAR: DP and PT pulse palpable. Foot is warm and well-perfused. Capillary fill time is brisk. DERMATOLOGIC: Normal skin turgor, texture, and temperature. No open lesions, rashes, or ulcerations. NEUROLOGIC: Normal sensation to light touch and pressure. No paresthesias. ORTHOPEDIC: Smooth, pain-free range of motion of all examined joints. No ecchymosis or bruising. No gross deformity. No pain to palpation. Rigid hammer toe contracture of left second toe, semi-rigid third toe, flexible hammer toes fourth and fifth.   No images are attached to the encounter.    Results RADIOLOGY Left foot radiographs: Significant digital contractures, elongated second metatarsal (12/21/2023)   Assessment:   1. Hammer toe of left foot      Plan:  Patient was evaluated and treated and all  questions answered.  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 The condition is hereditary and exacerbated by the long second metatarsal, causing increased tension on the tendon. Surgical intervention is necessary due to the rigidity and arthritic changes in the joint. Surgery involves bone shortening and joint arthroplasty with internal screw fixation. Risks include infection and a 10% chance of slow bone healing, though revision surgery is rare. Benefits include improved function and reduced shoe discomfort. The procedure is performed under deep sedation with a nerve block, allowing for outpatient recovery. - Schedule outpatient surgery for bone shortening and joint arthroplasty with internal screw fixation at [facility]. - Advise on postoperative care including wearing a walking boot for 6-8 weeks. - Discuss risks of surgery including infection and slow bone healing, with a low likelihood of adverse outcomes.  Semi-rigid hammer toe of left third toe Semi-rigid hammer toe of the left third toe, less severe than the second toe. Surgical correction is advised to prevent progression and improve function. Bone work is necessary, and screw fixation may be required. Risks include infection and slow bone healing, with a low likelihood of adverse outcomes. Benefits include improved function and reduced shoe discomfort. - Schedule outpatient surgery for bone work and possible screw fixation. - Advise on postoperative care including wearing a walking boot for 6-8 weeks. - Discuss risks of surgery including infection and slow bone  healing, with a low likelihood of adverse outcomes.  Flexible hammer toes of left fourth and fifth toes Flexible hammer toes of the left fourth and fifth toes. Tendon release is recommended to improve  alignment and prevent progression. The procedure is less invasive and involves releasing the flexor tendons. Risks include infection and slow bone healing, with a low likelihood of adverse outcomes. Benefits include improved alignment and reduced risk of progression. - Schedule outpatient procedure for flexor tendon release. - Advise on postoperative care including wearing a walking boot for 6-8 weeks. - Discuss risks of surgery including infection and slow bone healing, with a low likelihood of adverse outcomes.      Return in about 4 months (around 04/21/2024) for hammertoe surgery planning visit.

## 2024-01-22 ENCOUNTER — Other Ambulatory Visit: Payer: Self-pay | Admitting: Internal Medicine

## 2024-01-30 ENCOUNTER — Telehealth: Payer: Self-pay

## 2024-01-30 NOTE — Telephone Encounter (Signed)
 Received call from patient with concerns of antibiotic cause some recent episodes of diarrhea (more than 3x/day) States this started since last Wed. Denies any nausea/vomiting. Advised patient to increase fluid intake and will route to MD for any further advise.   Enis Kleine, LPN

## 2024-02-13 ENCOUNTER — Encounter: Payer: Self-pay | Admitting: Internal Medicine

## 2024-02-13 ENCOUNTER — Ambulatory Visit (INDEPENDENT_AMBULATORY_CARE_PROVIDER_SITE_OTHER): Admitting: Internal Medicine

## 2024-02-13 VITALS — BP 112/80 | HR 69 | Temp 98.4°F | Ht 72.25 in | Wt 253.0 lb

## 2024-02-13 DIAGNOSIS — K5901 Slow transit constipation: Secondary | ICD-10-CM | POA: Insufficient documentation

## 2024-02-13 NOTE — Assessment & Plan Note (Signed)
 Discussed bowel regimen with miralax  and senna s Then dulcolax prn after 3 days

## 2024-02-13 NOTE — Patient Instructions (Signed)
 Please try miralax  1 capful in full glass of water  three times a day and senna-s 2 tabs twice a day If after 3 days, you still haven't emptied well, then try the dulcolax then  After you empty, you probably want to continue miralax  and senna daily to keep you regular.

## 2024-02-13 NOTE — Progress Notes (Signed)
 Subjective:    Patient ID: Sean Garcia, male    DOB: 12-01-1950, 73 y.o.   MRN: [id number]  HPI Here due to constipation  Having constipation for the past 3 weeks Not having solid stool Dulcolax will get him some relief--but loose Miralax  not helping much Gets urgency then doesn't go much--gas Last decent BM was 2 weeks ago  Eating different---working on losing weight (with some success) Relatively inactive  Current Outpatient Medications on File Prior to Visit  Medication Sig Dispense Refill   acetaminophen  (TYLENOL ) 650 MG CR tablet Take 650 mg by mouth in the morning, at noon, and at bedtime.     amLODipine  (NORVASC ) 5 MG tablet TAKE 1 TABLET BY MOUTH EVERY DAY 90 tablet 3   amoxicillin  (AMOXIL ) 500 MG capsule Take 500 mg by mouth. With dental procedure     atorvastatin  (LIPITOR) 20 MG tablet TAKE 1 TABLET BY MOUTH EVERY DAY 90 tablet 3   bisoprolol  (ZEBETA ) 10 MG tablet TAKE 1 TABLET BY MOUTH EVERY DAY 90 tablet 3   cefadroxil  (DURICEF) 500 MG capsule Take 1 capsule (500 mg total) by mouth 2 (two) times daily. 60 capsule 9   cholecalciferol (VITAMIN D3) 25 MCG (1000 UNIT) tablet Take 1,000 Units by mouth daily.     colchicine  0.6 MG tablet TAKE 1 TABLET (0.6 MG TOTAL) BY MOUTH 2 (TWO) TIMES DAILY AS NEEDED. 60 tablet 2   desoximetasone  (TOPICORT ) 0.25 % cream APPLY SPARINGLY TO AFFECTED AREA TWICE A DAY     hydrocortisone 2.5 % cream Apply  as directed to affected area twice a day     ketoconazole (NIZORAL) 2 % cream SMARTSIG:1 Sparingly Topical Twice Daily     lisinopril  (ZESTRIL ) 40 MG tablet TAKE 1 TABLET BY MOUTH EVERY DAY 90 tablet 3   OVER THE COUNTER MEDICATION Black seed oil 2 tsp QD     vardenafil  (LEVITRA ) 20 MG tablet Take 1 tablet (20 mg total) by mouth daily as needed for erectile dysfunction. 10 tablet 11   No current facility-administered medications on file prior to visit.    Allergies  Allergen Reactions   Meloxicam      Stomach ulcer was developed    Codeine Itching and Rash    Past Medical History:  Diagnosis Date   Cancer (HCC)    Basal cell   Cataract    ED (erectile dysfunction)    GERD (gastroesophageal reflux disease)    Gout    Hyperlipidemia    Hypertension    Kidney stones    Obesity    Osteoarthritis     Past Surgical History:  Procedure Laterality Date   COLONOSCOPY  2009   benign polyp, rpt 10 yrs [facility])   HARDWARE REMOVAL Left 08/26/2021   Procedure: HARDWARE REMOVAL;  Surgeon: Cristy Bonner DASEN, MD;  Location: [facility];  Service: Orthopedics;  Laterality: Left;   KNEE SURGERY     for bowlegs, age 65   PATELLAR TENDON REPAIR  09/2008   left, Dr. Leron in [place]   SHOULDER ARTHROSCOPY Left 08/13/2021   Procedure: ARTHROSCOPY SHOULDER;  Surgeon: Cristy Bonner DASEN, MD;  Location: [facility];  Service: Orthopedics;  Laterality: Left;   SYNOVECTOMY Left 08/26/2021   Procedure: SYNOVECTOMY;  Surgeon: Cristy Bonner DASEN, MD;  Location: [facility];  Service: Orthopedics;  Laterality: Left;   SYNOVIAL BIOPSY Left 08/13/2021   Procedure: SYNOVIAL BIOPSY;  Surgeon: Cristy Bonner DASEN, MD;  Location: [facility];  Service: Orthopedics;  Laterality: Left;   TOTAL KNEE ARTHROPLASTY  2007   bilateral   TOTAL SHOULDER ARTHROPLASTY Left 08/26/2021   Procedure: TOTAL SHOULDER ARTHROPLASTY/HEMI;  Surgeon: Cristy Bonner DASEN, MD;  Location: [facility];  Service: Orthopedics;  Laterality: Left;   TOTAL SHOULDER REPLACEMENT  05/2009   left     Family History  Problem Relation Age of Onset   Hyperlipidemia Mother    Cancer Mother        lymphoma   Coronary artery disease Father    Heart disease Father        CHF   Colon cancer Neg Hx    Colon polyps Neg Hx    Esophageal cancer Neg Hx    Stomach cancer Neg Hx    Rectal cancer Neg Hx     Social History   Socioeconomic History   Marital status: Married    Spouse name: Not on file   Number of children: 2   Years of education: Not on file   Highest education level: Not  on file  Occupational History   Occupation: Administrator, [employer]    Employer: [employer]    Comment: Retired  Tobacco Use   Smoking status: Former    Types: Cigarettes    Start date: 1977    Passive exposure: Never   Smokeless tobacco: Never   Tobacco comments:    Quit in his 20's.  Vaping Use   Vaping status: Never Used  Substance and Sexual Activity   Alcohol use: No   Drug use: Never   Sexual activity: Yes  Other Topics Concern   Not on file  Social History Narrative   Has living will   Wife, then daughter Eleanor, would be health care POA   Would accept resuscitation   No long term life support (like ventilator or tube feeds) if cognitively unaware   Social Drivers of Corporate investment banker Strain: Not on file  Food Insecurity: Not on file  Transportation Needs: Not on file  Physical Activity: Not on file  Stress: Not on file  Social Connections: Not on file  Intimate Partner Violence: Not on file   Review of Systems Appetite is okay No N/V No blood in stool    Objective:   Physical Exam Constitutional:      Appearance: Normal appearance.  Abdominal:     General: Bowel sounds are normal. There is no distension.     Palpations: Abdomen is soft.     Tenderness: There is no abdominal tenderness. There is no guarding or rebound.  Neurological:     Mental Status: He is alert.            Assessment & Plan:

## 2024-02-21 ENCOUNTER — Telehealth: Payer: Self-pay

## 2024-02-21 MED ORDER — LINACLOTIDE 72 MCG PO CAPS: 72.0000 ug | ORAL_CAPSULE | Freq: Every day | ORAL | 3 refills | Status: AC

## 2024-02-21 NOTE — Telephone Encounter (Signed)
 No discount on GoodRx. Cannot use Linzess  coupon with Medicare.

## 2024-02-21 NOTE — Telephone Encounter (Signed)
 Spoke to pt. He would like Linzess  sent to [facility].

## 2024-02-21 NOTE — Telephone Encounter (Signed)
 Copied from CRM [id number]. Topic: Clinical - Medication Question >> Feb 21, 2024 10:30 AM Viola F wrote: Reason for CRM: Patient was seen 7/28 for digestive issues and says the over the counter meds aren't working and wants to know if Dr. Jimmy would recommend EMMA, a product he found on amazon that helps with digestive issues. Please call him at [phone] (M)

## 2024-03-04 DIAGNOSIS — R231 Pallor: Secondary | ICD-10-CM | POA: Diagnosis not present

## 2024-03-04 DIAGNOSIS — R55 Syncope and collapse: Secondary | ICD-10-CM | POA: Diagnosis not present

## 2024-03-04 DIAGNOSIS — E039 Hypothyroidism, unspecified: Secondary | ICD-10-CM | POA: Diagnosis not present

## 2024-03-04 DIAGNOSIS — Z888 Allergy status to other drugs, medicaments and biological substances status: Secondary | ICD-10-CM | POA: Diagnosis not present

## 2024-03-04 DIAGNOSIS — R079 Chest pain, unspecified: Secondary | ICD-10-CM | POA: Diagnosis not present

## 2024-03-04 DIAGNOSIS — S0990XA Unspecified injury of head, initial encounter: Secondary | ICD-10-CM | POA: Diagnosis not present

## 2024-03-04 DIAGNOSIS — Z885 Allergy status to narcotic agent status: Secondary | ICD-10-CM | POA: Diagnosis not present

## 2024-03-04 LAB — LAB REPORT - SCANNED
EGFR: 73
TSH: 4.56 (ref 0.41–5.90)

## 2024-03-05 ENCOUNTER — Telehealth: Payer: Self-pay | Admitting: Internal Medicine

## 2024-03-05 NOTE — Telephone Encounter (Signed)
 Spoke to pt. He is drinking Propel water .

## 2024-03-05 NOTE — Telephone Encounter (Signed)
 Patient was treated at ER over weekend in [place] for Dehydration.  Dropped off ppwk from [facility] placed in providers box for review

## 2024-03-21 DIAGNOSIS — Z85828 Personal history of other malignant neoplasm of skin: Secondary | ICD-10-CM | POA: Diagnosis not present

## 2024-03-21 DIAGNOSIS — L821 Other seborrheic keratosis: Secondary | ICD-10-CM | POA: Diagnosis not present

## 2024-03-21 DIAGNOSIS — D2272 Melanocytic nevi of left lower limb, including hip: Secondary | ICD-10-CM | POA: Diagnosis not present

## 2024-03-21 DIAGNOSIS — L570 Actinic keratosis: Secondary | ICD-10-CM | POA: Diagnosis not present

## 2024-03-21 DIAGNOSIS — Z85820 Personal history of malignant melanoma of skin: Secondary | ICD-10-CM | POA: Diagnosis not present

## 2024-03-21 DIAGNOSIS — D2271 Melanocytic nevi of right lower limb, including hip: Secondary | ICD-10-CM | POA: Diagnosis not present

## 2024-03-21 DIAGNOSIS — L812 Freckles: Secondary | ICD-10-CM | POA: Diagnosis not present

## 2024-04-27 ENCOUNTER — Other Ambulatory Visit: Payer: Self-pay | Admitting: Nurse Practitioner

## 2024-04-27 MED ORDER — LISINOPRIL 40 MG PO TABS: 40.0000 mg | ORAL_TABLET | Freq: Every day | ORAL | 3 refills | Status: AC

## 2024-04-27 NOTE — Telephone Encounter (Unsigned)
 Copied from CRM [id number]. Topic: Clinical - Medication Refill >> Apr 27, 2024  8:28 AM Tiffini S wrote: Medication: lisinopril  (ZESTRIL ) 40 MG tablet  Has the patient contacted their pharmacy? Yes (Agent: If no, request that the patient contact the pharmacy for the refill. If patient does not wish to contact the pharmacy document the reason why and proceed with request.) (Agent: If yes, when and what did the pharmacy advise?)  This is the patient's preferred pharmacy:  [phone] [place] [facility] [address] [place] [address] Phone: [phone] Fax: [phone]  Is this the correct pharmacy for this prescription? Yes If no, delete pharmacy and type the correct one.   Has the prescription been filled recently? Yes  Is the patient out of the medication? Yes, have 8 tablets left and will be out of town   Has the patient been seen for an appointment in the last year OR does the patient have an upcoming appointment? Yes  Can we respond through MyChart? No, please call the patient at [phone]  Agent: Please be advised that Rx refills may take up to 3 business days. We ask that you follow-up with your pharmacy.

## 2024-04-30 ENCOUNTER — Other Ambulatory Visit: Payer: Self-pay

## 2024-04-30 MED ORDER — BISOPROLOL FUMARATE 10 MG PO TABS: 10.0000 mg | ORAL_TABLET | Freq: Every day | ORAL | 0 refills | Status: DC

## 2024-04-30 NOTE — Telephone Encounter (Signed)
 Pt needs TOC in the next few months with Adina Crandall, NP prior to CPE in May 2025.

## 2024-04-30 NOTE — Telephone Encounter (Signed)
 Will do in a different message due to triage note.

## 2024-04-30 NOTE — Telephone Encounter (Signed)
 Copied from CRM [id number]. Topic: Clinical - Medication Refill >> Apr 30, 2024  9:13 AM Berwyn MATSU wrote: Medication: bisoprolol  (ZEBETA ) 10 MG tablet   Has the patient contacted their pharmacy? Yes  (Agent: If no, request that the patient contact the pharmacy for the refill. If patient does not wish to contact the pharmacy document the reason why and proceed with request.)  (Agent: If yes, when and what did the pharmacy advise?)    This is the patient's preferred pharmacy:  [facility] [phone] [place] - [address] [place] [address]  Phone: [phone] Fax: [phone]    Is this the correct pharmacy for this prescription? Yes  If no, delete pharmacy and type the correct one.    Has the prescription been filled recently? Yes    Is the patient out of the medication?  Yes just about    Has the patient been seen for an appointment in the last year OR does the patient have an upcoming appointment? Yes    Can we respond through MyChart? No, please call the patient at [phone]    Agent: Please be advised that Rx refills may take up to 3 business days. We ask that you follow-up with your pharmacy.

## 2024-05-07 ENCOUNTER — Other Ambulatory Visit: Payer: Self-pay | Admitting: Nurse Practitioner

## 2024-05-07 MED ORDER — COLCHICINE 0.6 MG PO TABS: 0.6000 mg | ORAL_TABLET | Freq: Two times a day (BID) | ORAL | 2 refills | Status: AC

## 2024-05-07 NOTE — Telephone Encounter (Signed)
 Copied from CRM [id number]. Topic: Clinical - Medication Refill >> May 07, 2024 10:18 AM Aleatha C wrote: Medication: colchicine  0.6 MG tablet  Has the patient contacted their pharmacy? Yes (Agent: If no, request that the patient contact the pharmacy for the refill. If patient does not wish to contact the pharmacy document the reason why and proceed with request.) (Agent: If yes, when and what did the pharmacy advise?)  [facility] the [facility] [address] [place], [place] [address]  Is this the correct pharmacy for this prescription? Yes If no, delete pharmacy and type the correct one.   Has the prescription been filled recently? No  Is the patient out of the medication? No, is out of town and Gout has flared up asking for 4 tablets  Has the patient been seen for an appointment in the last year OR does the patient have an upcoming appointment? No  Can we respond through MyChart? No  Agent: Please be advised that Rx refills may take up to 3 business days. We ask that you follow-up with your pharmacy.

## 2024-05-09 ENCOUNTER — Ambulatory Visit: Admitting: Podiatry

## 2024-07-31 ENCOUNTER — Other Ambulatory Visit: Payer: Self-pay | Admitting: Family

## 2024-08-01 NOTE — Telephone Encounter (Unsigned)
 Copied from CRM [id number]. Topic: Clinical - Medication Prior Auth >> Aug 01, 2024 12:40 PM Nessti S wrote: Reason for CRM: pt called because he was told he would need refill prior aut for bisoprolol  (ZEBETA ) 10 MG tablet. He would like this refilled soon as possible

## 2024-08-01 NOTE — Telephone Encounter (Signed)
 Called pt to clarify what he was needing. Was he needing a refill or was his insurance requiring authorization. He said he needed a refill. I advised [facility] [place]  [facility] and it was sent to them. He stated he did not need it there. It was an emergency fill that time. I advised him that he needed to contact [facility] and see if they are able to pull the rx from their system. If not, they should be able to call [facility] in [place]  and get the transfer.

## 2024-08-16 ENCOUNTER — Ambulatory Visit: Admitting: Family Medicine

## 2024-08-16 ENCOUNTER — Ambulatory Visit: Payer: Self-pay

## 2024-08-16 NOTE — Telephone Encounter (Signed)
 10/10 back pain is uncommon and usually means some kind of severe problem.  Recommend face-to-face evaluation.  Virtual is not appropriate.

## 2024-08-16 NOTE — Telephone Encounter (Signed)
 Called patient advised this is not something we can arcuately evaluate virtual. He states there is no way he can make it into office today. Recommended urgent care or ED visit. Patient declined states he knows what the problem is he will just call his chiropractor and get evaluation with him. Declined to reschedule visit in office. Advised if questions or if he would like to be seen in person in our office to evaluate to give us  a call. He thanked me and call was disconnected. I have canceled appointment for today.

## 2024-08-16 NOTE — Telephone Encounter (Signed)
 FYI Only or Action Required?: FYI only for provider: appointment scheduled on 08/16/24.  Patient was last seen in primary care on 02/13/2024 by Sean Charlie FERNS, MD.  Called Nurse Triage reporting Back Pain.  Symptoms began 2 weeks ago.  Interventions attempted: OTC medications: Tylenol .  Symptoms are: gradually worsening.  Triage Disposition: See PCP When Office is Open (Within 3 Days)  Patient/caregiver understands and will follow disposition?: Yes               Message from [facility] C sent at 08/16/2024  8:56 AM EST  Reason for Triage: Patient has a new pat appnt in may with Dr Wendee ,and he states he feels he has pulled a muscle in his back. And is in severe pain. Been taking tylenol  but not helping.   Reason for Disposition  [1] MODERATE back pain (e.g., interferes with normal activities) AND [2] present > 3 days  Answer Assessment - Initial Assessment Questions 1. ONSET: When did the pain begin? (e.g., minutes, hours, days)     2 weeks ago  2. LOCATION: Where does it hurt? (upper, mid or lower back)     Lower back.  3. SEVERITY: How bad is the pain?  (e.g., Scale 1-10; mild, moderate, or severe)     8/10 and has not taken anything for pain today (states Tylenol  eases the pain off a little bit). 10/10 with movement or walking, using a cane  4. PATTERN: Is the pain constant? (e.g., yes, no; constant, intermittent)      Constant.  5. RADIATION: Does the pain shoot into your legs or somewhere else?     No.  6. CAUSE:  What do you think is causing the back pain?      19 years ago, sacral pain similar to this.  7. BACK OVERUSE:  Any recent lifting of heavy objects, strenuous work or exercise?     Played golf 2 weeks ago and thinks he didn't warm up properly  8. MEDICINES: What have you taken so far for the pain? (e.g., nothing, acetaminophen , NSAIDS)     Tylenol   9. NEUROLOGIC SYMPTOMS: Do you have any weakness, numbness, or problems with  bowel/bladder control?     No.  10. OTHER SYMPTOMS: Do you have any other symptoms? (e.g., fever, abdomen pain, burning with urination, blood in urine)       No numbness or weakness, LOC/passing out, fever, abdominal pain, urinary symptoms, incontinence of bowel or bladder  Protocols used: Back Pain-A-AH

## 2024-08-17 ENCOUNTER — Ambulatory Visit: Payer: Self-pay

## 2024-08-17 ENCOUNTER — Ambulatory Visit
Admission: EM | Admit: 2024-08-17 | Discharge: 2024-08-17 | Disposition: A | Discharge status: Home / Self Care | Attending: Emergency Medicine | Admitting: Emergency Medicine

## 2024-08-17 ENCOUNTER — Encounter: Payer: Self-pay | Admitting: Emergency Medicine

## 2024-08-17 DIAGNOSIS — M545 Low back pain, unspecified: Secondary | ICD-10-CM | POA: Diagnosis not present

## 2024-08-17 MED ORDER — KETOROLAC TROMETHAMINE 30 MG/ML IJ SOLN
30.0000 mg | Freq: Once | INTRAMUSCULAR | Status: AC
  Administered 2024-08-17: 30 mg via INTRAMUSCULAR

## 2024-08-17 MED ORDER — DICLOFENAC SODIUM 1 % EX GEL: 2.0000 g | Freq: Four times a day (QID) | CUTANEOUS | 0 refills | Status: AC

## 2024-08-17 MED ORDER — BACLOFEN 5 MG PO TABS: 5.0000 mg | ORAL_TABLET | Freq: Three times a day (TID) | ORAL | 0 refills | Status: AC

## 2024-08-17 NOTE — ED Triage Notes (Signed)
 Patient reports lower back pain x 10 days. Patient states she played gold without warming up and next day she started having pain. Patient has been taking Tylenol  with mild relief. Last dose of Tylenol  was at 7:00 am. Rates back pain 8/10.

## 2024-08-17 NOTE — Telephone Encounter (Signed)
 Primus Speaker A   08/17/2024  2:00 PM  Patient is calling to cancel appt. He went to UC    Pt transferred and reported he went to UC, would like to cancel appt. Appt cancelled and pt has no further concerns at this time. Encouraged him to contact clinic with any further concerns or questions.

## 2024-08-17 NOTE — ED Provider Notes (Signed)
 " Sean Garcia    CSN: [id number] Arrival date & time: 08/17/24  1207      History   Chief Complaint Chief Complaint  Patient presents with   Back Pain    HPI Sean Garcia is a 74 y.o. male.   Patient presents for evaluation of left-sided low back pain that has been constant beginning 10 days ago.  Started after golfing in which she did not properly stretch prior to.  Symptoms interfering with sleep exacerbated by laying flat, described as a spasming overnight.  Has attempted use of Tylenol  which has been ineffective.  Endorses a chronic history of back pain that occurred initially 35 years ago, at that time noted to have a bulging disc.  Has been managed by chiropractor.  Denies numbness or tingling, urinary or bowel symptoms.    Past Medical History:  Diagnosis Date   Cancer 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/26/2021   Advance directive discussed with patient 05/14/2019   Routine general medical examination at a health care facility 06/11/2013   Gout 12/11/2009   Hyperlipemia 10/05/2007   Obesity 10/05/2007   Essential hypertension, benign 10/05/2007   ERECTILE DYSFUNCTION, 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 Leron in [place]    SHOULDER ARTHROSCOPY Left 08/13/2021   Procedure: ARTHROSCOPY SHOULDER;  Surgeon: Cristy Bonner DASEN, MD;  Location: [facility];  Service: Orthopedics;  Laterality: Left;   SYNOVECTOMY Left 08/26/2021   Procedure: SYNOVECTOMY;  Surgeon: Cristy Bonner DASEN, MD;  Location: [facility];  Service: Orthopedics;  Laterality: Left;   SYNOVIAL BIOPSY Left 08/13/2021   Procedure: SYNOVIAL BIOPSY;  Surgeon: Cristy Bonner DASEN, MD;  Location: [facility];  Service: Orthopedics;  Laterality: Left;   TOTAL KNEE ARTHROPLASTY  2007   bilateral   TOTAL SHOULDER ARTHROPLASTY Left 08/26/2021   Procedure: TOTAL SHOULDER ARTHROPLASTY/HEMI;  Surgeon: Cristy Bonner DASEN, MD;  Location: [facility];  Service: Orthopedics;  Laterality: Left;   TOTAL SHOULDER REPLACEMENT  05/2009   left        Home Medications    Prior to Admission medications  Medication Sig Start Date End Date Taking? Authorizing Provider  acetaminophen  (TYLENOL ) 650 MG CR tablet Take 650 mg by mouth in the morning, at noon, and at bedtime.    [provider]  amLODipine  (NORVASC ) 5 MG tablet TAKE 1 TABLET BY MOUTH EVERY DAY 12/01/23   Jimmy Ade I, MD  amoxicillin  (AMOXIL ) 500 MG capsule Take 500 mg by mouth. With dental procedure 05/11/23   [provider]  atorvastatin  (LIPITOR) 20 MG tablet TAKE 1 TABLET BY MOUTH EVERY  DAY 01/23/24   Jimmy Charlie FERNS, MD  bisoprolol  (ZEBETA ) 10 MG tablet TAKE 1 TABLET BY MOUTH EVERY DAY 08/01/24   Wendee Lynwood HERO, NP  cefadroxil  (DURICEF) 500 MG capsule Take 1 capsule (500 mg total) by mouth 2 (two) times daily. 10/24/23   Dennise Kingsley, MD  cholecalciferol (VITAMIN D3) 25 MCG (1000 UNIT) tablet Take 1,000 Units by mouth daily.    [provider]  colchicine  0.6 MG tablet Take 1 tablet (0.6 mg total) by mouth 2 (two) times daily. 05/07/24   Webb, Padonda B, FNP  desoximetasone  (TOPICORT ) 0.25 % cream APPLY SPARINGLY TO AFFECTED AREA TWICE A DAY 01/12/23   [provider]   hydrocortisone 2.5 % cream Apply  as directed to affected area twice a day 03/18/23   [provider]  ketoconazole (NIZORAL) 2 % cream SMARTSIG:1 Sparingly Topical Twice Daily 03/02/23   [provider]  linaclotide  (LINZESS ) 72 MCG capsule Take 1 capsule (72 mcg total) by mouth daily before breakfast. 02/21/24   Jimmy Charlie FERNS, MD  lisinopril  (ZESTRIL ) 40 MG tablet Take 1 tablet (40 mg total) by mouth daily. 04/27/24   Webb, Padonda B, FNP  OVER THE COUNTER MEDICATION Black seed oil 2 tsp QD    [provider]  vardenafil  (LEVITRA ) 20 MG tablet Take 1 tablet (20 mg total) by mouth daily as needed for erectile dysfunction. 12/01/23   Jimmy Charlie FERNS, MD    Family History Family History  Problem Relation Age of Onset   Hyperlipidemia Mother    Cancer Mother        lymphoma   Coronary artery disease Father    Heart disease Father        CHF   Colon cancer Neg Hx    Colon polyps Neg Hx    Esophageal cancer Neg Hx    Stomach cancer Neg Hx    Rectal cancer Neg Hx     Social History Social History[1]   Allergies   Meloxicam  and Codeine   Review of Systems Review of Systems   Physical Exam Triage Vital Signs ED Triage Vitals  Encounter Vitals Group     BP 08/17/24 1238 109/69     Girls Systolic BP Percentile --      Girls Diastolic BP Percentile --      Boys Systolic BP Percentile --      Boys Diastolic BP Percentile --      Pulse Rate 08/17/24 1238 62     Resp 08/17/24 1238 20     Temp 08/17/24 1238 98.1 F (36.7 C)     Temp Source 08/17/24 1238 Oral     SpO2 08/17/24 1238 95 %     Weight --      Height --      Head Circumference --      Peak Flow --      Pain Score 08/17/24 1242 8     Pain Loc --      Pain Education --      Exclude from Growth Chart --    No data found.  Updated Vital Signs BP 109/69 (BP Location: Right Arm)   Pulse 62   Temp 98.1 F (36.7 C) (Oral)   Resp 20   SpO2 95%   Visual Acuity Right Eye  Distance:   Left Eye Distance:   Bilateral Distance:    Right Eye Near:   Left Eye Near:    Bilateral Near:     Physical  Exam Constitutional:      Appearance: Normal appearance.  Eyes:     Extraocular Movements: Extraocular movements intact.  Pulmonary:     Effort: Pulmonary effort is normal.  Musculoskeletal:     Comments: Tenderness present to the left lower latissimus dorsi without spinal tenderness, ecchymosis swelling or deformity  Neurological:     Mental Status: He is alert and oriented to person, place, and time.      UC Treatments / Results  Labs (all labs ordered are listed, but only abnormal results are displayed) Labs Reviewed - No data to display  EKG   Radiology No results found.  Procedures Procedures (including critical care time)  Medications Ordered in UC Medications - No data to display  Initial Impression / Assessment and Plan / UC Course  I have reviewed the triage vital signs and the nursing notes.  Pertinent labs & imaging results that were available during my care of the patient were reviewed by me and considered in my medical decision making (see chart for details).  Acute left-sided low back pain without sciatica  Etiology appears to be muscular, no spinal tenderness or signs of saddle anesthesia, stable, deferring imaging at this time, Toradol  IM given, avoiding NSAIDs due to history of gastric ulcer, therefore prescribed diclofenac  gel and baclofen  recommended supportive care through [facility], heat massage stretching with activity as tolerable advised chiropractor for PCP follow-up as well as given referral to orthopedics Final Clinical Impressions(s) / UC Diagnoses   Final diagnoses:  None   Discharge Instructions   None    ED Prescriptions   None    PDMP not reviewed this encounter.     [1]  Social History Tobacco Use   Smoking status: Former    Types: Cigarettes    Start date: 1977    Passive exposure: Never    Smokeless tobacco: Never   Tobacco comments:    Quit in his 20's.  Vaping Use   Vaping status: Never Used  Substance Use Topics   Alcohol use: No   Drug use: Never     Teresa Shelba SAUNDERS, NP 08/17/24 1335  "

## 2024-08-17 NOTE — Telephone Encounter (Signed)
 FYI Only or Action Required?: FYI only for provider: UC.  Patient was last seen in primary care on 02/13/2024 by Jimmy Charlie FERNS, MD.  Called Nurse Triage reporting Back Pain.  Symptoms began yesterday.  Interventions attempted: Nothing.  Symptoms are: unchanged.  Triage Disposition: See HCP Within 4 Hours (Or PCP Triage)  Patient/caregiver understands and will follow disposition?: Yes   Message from Isabela D sent at 08/17/2024  8:36 AM EST  Reason for Triage: pt is having severe pain/ muscle spasms in his lower back says he could hardly walk to the bathroom and is in excruciating pain.    Week ago Tylenol  Muschle spasms  denies Reason for Disposition  [1] SEVERE back pain (e.g., excruciating, unable to do any normal activities) AND [2] not improved 2 hours after pain medicine  Answer Assessment - Initial Assessment Questions No available appts. worsen:Advised call back or ED/911 if symptoms occur/worsen: severe diff breathing, chest pain > 5 min, faint.,severe abd pain, weakness, numbness, loss b/b, cool/blue extremities. Patient verbalized understanding.   Pt reports will go to UC.  1. ONSET: When did the pain begin? (e.g., minutes, hours, days)     Week ago, this mornign worse 2. LOCATION: Where does it hurt? (upper, mid or lower back)     Lower back pain 3. SEVERITY: How bad is the pain?  (e.g., Scale 1-10; mild, moderate, or severe)     4/10, 10/10 at worse 4. PATTERN: Is the pain constant? (e.g., yes, no; constant, intermittent)      intermittent 5. RADIATION: Does the pain shoot into your legs or somewhere else?     Upper left thigh intermittent 6. CAUSE:  What do you think is causing the back pain?      unsure 8. MEDICINES: What have you taken so far for the pain? (e.g., nothing, acetaminophen , NSAIDS)     Tylenol ; not effective 9. NEUROLOGIC SYMPTOMS: Do you have any weakness, numbness, or problems with bowel/bladder control?     Denies  weakness/numbness/ loss b/b, cool/ blue legs 10. OTHER SYMPTOMS: Do you have any other symptoms? (e.g., fever, abdomen pain, burning with urination, blood in urine)    Denies abd pain, fever chills n/v, diff breath,faint  Protocols used: Back Pain-A-AH

## 2024-08-17 NOTE — Discharge Instructions (Addendum)
 Your pain is most likely caused by irritation to the muscles .  You have been given an injection of Toradol  to help reduce inflammation and help with pain and ideally should see improvement within the next 30 minutes to an hour  At home you may use diclofenac  gel every 4-6 hours to further help calm pain, may take Tylenol  additionally  You may use muscle relaxant every 8 hours as needed to help with spasming, if causing drowsiness please take at bedtime  You may use heating pad in 15 minute intervals as needed for additional comfort  Begin stretching affected area daily for 10 minutes as tolerated to further loosen muscles   When lying down place pillow underneath and between knees for support  May follow-up with your chiropractor or family doctor as needed however if pain persist after recommended treatment or reoccurs if may be beneficial to follow up with orthopedic specialist for evaluation, this doctor specializes in the bones and can manage your symptoms long-term with options such as but not limited to imaging, medications or physical therapy

## 2024-08-20 ENCOUNTER — Ambulatory Visit: Admitting: Family Medicine

## 2024-10-11 ENCOUNTER — Ambulatory Visit: Admitting: Internal Medicine

## 2024-12-05 ENCOUNTER — Encounter: Admitting: Nurse Practitioner
